# Patient Record
Sex: Female | Born: 1935 | Race: White | Hispanic: No | Marital: Married | Smoking: Never smoker
Health system: Southern US, Community
[De-identification: ages and names within clinical notes are randomized; demographics above are authoritative.]

## PROBLEM LIST (undated history)

## (undated) DIAGNOSIS — J449 Chronic obstructive pulmonary disease, unspecified: Secondary | ICD-10-CM

## (undated) DIAGNOSIS — Z7901 Long term (current) use of anticoagulants: Secondary | ICD-10-CM

## (undated) DIAGNOSIS — N200 Calculus of kidney: Secondary | ICD-10-CM

## (undated) DIAGNOSIS — I10 Essential (primary) hypertension: Secondary | ICD-10-CM

## (undated) DIAGNOSIS — M1990 Unspecified osteoarthritis, unspecified site: Secondary | ICD-10-CM

## (undated) DIAGNOSIS — I482 Chronic atrial fibrillation, unspecified: Secondary | ICD-10-CM

## (undated) DIAGNOSIS — I2510 Atherosclerotic heart disease of native coronary artery without angina pectoris: Secondary | ICD-10-CM

## (undated) DIAGNOSIS — E785 Hyperlipidemia, unspecified: Secondary | ICD-10-CM

## (undated) HISTORY — DX: Unspecified osteoarthritis, unspecified site: M19.90

## (undated) HISTORY — PX: PARATHYROIDECTOMY: SHX19

## (undated) HISTORY — DX: Hyperlipidemia, unspecified: E78.5

## (undated) HISTORY — PX: KNEE ARTHROSCOPY: SUR90

## (undated) HISTORY — DX: Calculus of kidney: N20.0

## (undated) HISTORY — PX: KIDNEY STONE SURGERY: SHX686

## (undated) HISTORY — DX: Chronic atrial fibrillation, unspecified: I48.20

## (undated) HISTORY — PX: TOTAL ABDOMINAL HYSTERECTOMY: SHX209

## (undated) HISTORY — PX: CARDIOVERSION: SHX1299

## (undated) HISTORY — DX: Long term (current) use of anticoagulants: Z79.01

## (undated) HISTORY — PX: COLONOSCOPY: SHX174

---

## 2000-07-23 ENCOUNTER — Encounter: Payer: Self-pay | Admitting: Pulmonary Disease

## 2000-07-23 ENCOUNTER — Inpatient Hospital Stay (HOSPITAL_COMMUNITY): Admission: AD | Admit: 2000-07-23 | Discharge: 2000-07-28 | Payer: Self-pay | Admitting: Pulmonary Disease

## 2000-07-24 ENCOUNTER — Encounter: Payer: Self-pay | Admitting: Pulmonary Disease

## 2000-07-26 ENCOUNTER — Encounter: Payer: Self-pay | Admitting: Pulmonary Disease

## 2000-11-08 ENCOUNTER — Ambulatory Visit (HOSPITAL_COMMUNITY): Admission: RE | Admit: 2000-11-08 | Discharge: 2000-11-08 | Payer: Self-pay | Admitting: Internal Medicine

## 2000-11-08 ENCOUNTER — Encounter: Payer: Self-pay | Admitting: Internal Medicine

## 2001-04-21 ENCOUNTER — Encounter: Payer: Self-pay | Admitting: Internal Medicine

## 2001-04-21 ENCOUNTER — Ambulatory Visit (HOSPITAL_COMMUNITY): Admission: RE | Admit: 2001-04-21 | Discharge: 2001-04-21 | Payer: Self-pay | Admitting: Internal Medicine

## 2001-07-24 ENCOUNTER — Inpatient Hospital Stay (HOSPITAL_COMMUNITY): Admission: EM | Admit: 2001-07-24 | Discharge: 2001-07-30 | Payer: Self-pay | Admitting: Emergency Medicine

## 2001-07-24 ENCOUNTER — Encounter: Payer: Self-pay | Admitting: Emergency Medicine

## 2001-07-25 ENCOUNTER — Encounter: Payer: Self-pay | Admitting: Internal Medicine

## 2001-07-26 ENCOUNTER — Encounter: Payer: Self-pay | Admitting: Internal Medicine

## 2001-08-15 ENCOUNTER — Encounter: Payer: Self-pay | Admitting: Internal Medicine

## 2001-08-15 ENCOUNTER — Ambulatory Visit (HOSPITAL_COMMUNITY): Admission: RE | Admit: 2001-08-15 | Discharge: 2001-08-15 | Payer: Self-pay | Admitting: Internal Medicine

## 2001-11-12 ENCOUNTER — Encounter: Payer: Self-pay | Admitting: Internal Medicine

## 2001-11-12 ENCOUNTER — Inpatient Hospital Stay (HOSPITAL_COMMUNITY): Admission: EM | Admit: 2001-11-12 | Discharge: 2001-11-15 | Payer: Self-pay | Admitting: Internal Medicine

## 2002-02-02 ENCOUNTER — Encounter: Payer: Self-pay | Admitting: Emergency Medicine

## 2002-02-02 ENCOUNTER — Emergency Department (HOSPITAL_COMMUNITY): Admission: EM | Admit: 2002-02-02 | Discharge: 2002-02-02 | Payer: Self-pay | Admitting: Emergency Medicine

## 2002-03-23 ENCOUNTER — Encounter: Payer: Self-pay | Admitting: Orthopedic Surgery

## 2002-03-23 ENCOUNTER — Encounter: Admission: RE | Admit: 2002-03-23 | Discharge: 2002-03-23 | Payer: Self-pay | Admitting: Orthopedic Surgery

## 2002-03-24 ENCOUNTER — Ambulatory Visit (HOSPITAL_BASED_OUTPATIENT_CLINIC_OR_DEPARTMENT_OTHER): Admission: RE | Admit: 2002-03-24 | Discharge: 2002-03-24 | Payer: Self-pay | Admitting: Orthopedic Surgery

## 2002-06-08 ENCOUNTER — Ambulatory Visit (HOSPITAL_COMMUNITY): Admission: RE | Admit: 2002-06-08 | Discharge: 2002-06-08 | Payer: Self-pay | Admitting: Internal Medicine

## 2002-06-08 ENCOUNTER — Encounter: Payer: Self-pay | Admitting: Internal Medicine

## 2002-07-20 HISTORY — PX: ORIF HIP FRACTURE: SHX2125

## 2002-09-16 ENCOUNTER — Inpatient Hospital Stay (HOSPITAL_COMMUNITY): Admission: EM | Admit: 2002-09-16 | Discharge: 2002-09-21 | Payer: Self-pay | Admitting: Emergency Medicine

## 2002-09-16 ENCOUNTER — Encounter: Payer: Self-pay | Admitting: Emergency Medicine

## 2002-09-17 ENCOUNTER — Encounter: Payer: Self-pay | Admitting: Orthopedic Surgery

## 2002-09-19 ENCOUNTER — Encounter: Payer: Self-pay | Admitting: Orthopedic Surgery

## 2002-09-20 ENCOUNTER — Encounter: Payer: Self-pay | Admitting: Orthopedic Surgery

## 2002-09-21 ENCOUNTER — Inpatient Hospital Stay (HOSPITAL_COMMUNITY)
Admission: RE | Admit: 2002-09-21 | Discharge: 2002-09-28 | Payer: Self-pay | Admitting: Physical Medicine & Rehabilitation

## 2002-09-27 ENCOUNTER — Encounter: Payer: Self-pay | Admitting: Physical Medicine & Rehabilitation

## 2002-10-12 ENCOUNTER — Encounter: Payer: Self-pay | Admitting: Internal Medicine

## 2002-10-12 ENCOUNTER — Ambulatory Visit (HOSPITAL_COMMUNITY): Admission: RE | Admit: 2002-10-12 | Discharge: 2002-10-12 | Payer: Self-pay | Admitting: Internal Medicine

## 2002-12-21 ENCOUNTER — Encounter (HOSPITAL_COMMUNITY): Admission: RE | Admit: 2002-12-21 | Discharge: 2003-01-20 | Payer: Self-pay | Admitting: Orthopedic Surgery

## 2003-01-10 ENCOUNTER — Ambulatory Visit (HOSPITAL_COMMUNITY): Admission: RE | Admit: 2003-01-10 | Discharge: 2003-01-10 | Payer: Self-pay | Admitting: Cardiology

## 2003-01-15 ENCOUNTER — Encounter (HOSPITAL_COMMUNITY): Admission: RE | Admit: 2003-01-15 | Discharge: 2003-02-14 | Payer: Self-pay | Admitting: *Deleted

## 2004-01-17 ENCOUNTER — Observation Stay (HOSPITAL_COMMUNITY): Admission: AD | Admit: 2004-01-17 | Discharge: 2004-01-18 | Payer: Self-pay | Admitting: *Deleted

## 2004-02-21 ENCOUNTER — Ambulatory Visit (HOSPITAL_COMMUNITY): Admission: RE | Admit: 2004-02-21 | Discharge: 2004-02-21 | Payer: Self-pay | Admitting: *Deleted

## 2004-06-09 ENCOUNTER — Ambulatory Visit: Payer: Self-pay | Admitting: *Deleted

## 2004-06-30 ENCOUNTER — Ambulatory Visit: Payer: Self-pay | Admitting: Cardiology

## 2004-08-04 ENCOUNTER — Ambulatory Visit: Payer: Self-pay | Admitting: Internal Medicine

## 2004-09-03 ENCOUNTER — Ambulatory Visit: Payer: Self-pay | Admitting: *Deleted

## 2004-10-01 ENCOUNTER — Ambulatory Visit: Payer: Self-pay | Admitting: Cardiovascular Disease

## 2004-10-30 ENCOUNTER — Ambulatory Visit: Payer: Self-pay | Admitting: *Deleted

## 2004-11-06 ENCOUNTER — Ambulatory Visit: Payer: Self-pay | Admitting: *Deleted

## 2004-12-11 ENCOUNTER — Ambulatory Visit: Payer: Self-pay | Admitting: *Deleted

## 2005-01-14 ENCOUNTER — Ambulatory Visit: Payer: Self-pay | Admitting: *Deleted

## 2005-02-16 ENCOUNTER — Ambulatory Visit: Payer: Self-pay | Admitting: *Deleted

## 2005-03-19 ENCOUNTER — Ambulatory Visit: Payer: Self-pay | Admitting: *Deleted

## 2005-03-26 ENCOUNTER — Ambulatory Visit: Payer: Self-pay | Admitting: *Deleted

## 2005-04-16 ENCOUNTER — Ambulatory Visit: Payer: Self-pay | Admitting: *Deleted

## 2005-05-14 ENCOUNTER — Ambulatory Visit: Payer: Self-pay | Admitting: *Deleted

## 2005-06-17 ENCOUNTER — Ambulatory Visit: Payer: Self-pay | Admitting: *Deleted

## 2005-07-16 ENCOUNTER — Ambulatory Visit: Payer: Self-pay | Admitting: Cardiology

## 2005-08-18 ENCOUNTER — Ambulatory Visit: Payer: Self-pay | Admitting: *Deleted

## 2005-09-18 ENCOUNTER — Ambulatory Visit: Payer: Self-pay | Admitting: *Deleted

## 2005-09-30 ENCOUNTER — Inpatient Hospital Stay (HOSPITAL_COMMUNITY): Admission: AD | Admit: 2005-09-30 | Discharge: 2005-10-05 | Payer: Self-pay | Admitting: Internal Medicine

## 2005-09-30 ENCOUNTER — Emergency Department (HOSPITAL_COMMUNITY): Admission: EM | Admit: 2005-09-30 | Discharge: 2005-09-30 | Payer: Self-pay | Admitting: Emergency Medicine

## 2005-10-27 ENCOUNTER — Ambulatory Visit: Payer: Self-pay | Admitting: *Deleted

## 2005-11-27 ENCOUNTER — Ambulatory Visit: Payer: Self-pay | Admitting: *Deleted

## 2005-12-31 ENCOUNTER — Ambulatory Visit: Payer: Self-pay | Admitting: *Deleted

## 2006-01-26 ENCOUNTER — Ambulatory Visit: Payer: Self-pay | Admitting: Cardiology

## 2006-03-03 ENCOUNTER — Ambulatory Visit: Payer: Self-pay | Admitting: *Deleted

## 2006-04-07 ENCOUNTER — Ambulatory Visit: Payer: Self-pay | Admitting: Cardiology

## 2006-04-13 ENCOUNTER — Ambulatory Visit: Payer: Self-pay | Admitting: Cardiology

## 2006-05-06 ENCOUNTER — Ambulatory Visit (HOSPITAL_COMMUNITY): Admission: RE | Admit: 2006-05-06 | Discharge: 2006-05-06 | Payer: Self-pay | Admitting: Internal Medicine

## 2006-05-07 ENCOUNTER — Ambulatory Visit: Payer: Self-pay | Admitting: Cardiology

## 2006-05-27 ENCOUNTER — Ambulatory Visit (HOSPITAL_COMMUNITY): Admission: RE | Admit: 2006-05-27 | Discharge: 2006-05-27 | Payer: Self-pay | Admitting: Internal Medicine

## 2006-06-07 ENCOUNTER — Ambulatory Visit: Payer: Self-pay | Admitting: Cardiology

## 2006-07-06 ENCOUNTER — Ambulatory Visit: Payer: Self-pay | Admitting: Cardiology

## 2006-08-10 ENCOUNTER — Ambulatory Visit: Payer: Self-pay | Admitting: Cardiovascular Disease

## 2006-09-16 ENCOUNTER — Ambulatory Visit: Payer: Self-pay | Admitting: Cardiology

## 2006-10-14 ENCOUNTER — Ambulatory Visit: Payer: Self-pay | Admitting: Cardiology

## 2006-11-12 ENCOUNTER — Ambulatory Visit: Payer: Self-pay | Admitting: Internal Medicine

## 2006-12-14 ENCOUNTER — Ambulatory Visit: Payer: Self-pay | Admitting: Cardiology

## 2007-01-17 ENCOUNTER — Ambulatory Visit: Payer: Self-pay | Admitting: Internal Medicine

## 2007-02-14 ENCOUNTER — Ambulatory Visit: Payer: Self-pay | Admitting: Cardiology

## 2007-03-02 ENCOUNTER — Ambulatory Visit: Payer: Self-pay | Admitting: Cardiology

## 2007-03-30 ENCOUNTER — Ambulatory Visit: Payer: Self-pay | Admitting: Cardiology

## 2007-04-27 ENCOUNTER — Ambulatory Visit: Payer: Self-pay | Admitting: Cardiovascular Disease

## 2007-06-01 ENCOUNTER — Ambulatory Visit: Payer: Self-pay | Admitting: Cardiovascular Disease

## 2007-06-29 ENCOUNTER — Ambulatory Visit: Payer: Self-pay | Admitting: Cardiology

## 2007-07-29 ENCOUNTER — Ambulatory Visit: Payer: Self-pay | Admitting: Cardiology

## 2007-08-12 ENCOUNTER — Ambulatory Visit: Payer: Self-pay | Admitting: Cardiology

## 2007-08-16 ENCOUNTER — Ambulatory Visit: Payer: Self-pay | Admitting: Cardiology

## 2007-09-08 ENCOUNTER — Ambulatory Visit: Payer: Self-pay | Admitting: Cardiovascular Disease

## 2007-10-06 ENCOUNTER — Ambulatory Visit: Payer: Self-pay | Admitting: Cardiology

## 2007-11-03 ENCOUNTER — Ambulatory Visit: Payer: Self-pay | Admitting: Cardiovascular Disease

## 2007-12-02 ENCOUNTER — Ambulatory Visit: Payer: Self-pay | Admitting: Cardiology

## 2008-01-02 ENCOUNTER — Ambulatory Visit: Payer: Self-pay | Admitting: Cardiology

## 2008-01-16 ENCOUNTER — Ambulatory Visit: Payer: Self-pay | Admitting: Internal Medicine

## 2008-02-11 ENCOUNTER — Emergency Department (HOSPITAL_COMMUNITY): Admission: EM | Admit: 2008-02-11 | Discharge: 2008-02-11 | Payer: Self-pay | Admitting: Emergency Medicine

## 2008-02-13 ENCOUNTER — Ambulatory Visit: Payer: Self-pay | Admitting: Cardiology

## 2008-02-14 ENCOUNTER — Inpatient Hospital Stay (HOSPITAL_COMMUNITY): Admission: EM | Admit: 2008-02-14 | Discharge: 2008-02-18 | Payer: Self-pay | Admitting: Emergency Medicine

## 2008-02-27 ENCOUNTER — Ambulatory Visit: Payer: Self-pay | Admitting: Cardiology

## 2008-03-06 ENCOUNTER — Ambulatory Visit: Payer: Self-pay | Admitting: Cardiology

## 2008-03-22 ENCOUNTER — Ambulatory Visit: Payer: Self-pay | Admitting: Cardiology

## 2008-04-05 ENCOUNTER — Ambulatory Visit: Payer: Self-pay | Admitting: Cardiology

## 2008-04-23 ENCOUNTER — Ambulatory Visit: Payer: Self-pay | Admitting: Cardiology

## 2008-05-14 ENCOUNTER — Ambulatory Visit: Payer: Self-pay | Admitting: Cardiology

## 2008-06-18 ENCOUNTER — Ambulatory Visit: Payer: Self-pay | Admitting: Cardiology

## 2008-07-09 ENCOUNTER — Ambulatory Visit: Payer: Self-pay | Admitting: Cardiology

## 2008-07-16 ENCOUNTER — Ambulatory Visit (HOSPITAL_COMMUNITY): Admission: RE | Admit: 2008-07-16 | Discharge: 2008-07-16 | Payer: Self-pay | Admitting: Internal Medicine

## 2008-07-16 ENCOUNTER — Ambulatory Visit: Payer: Self-pay | Admitting: Cardiology

## 2008-07-30 ENCOUNTER — Ambulatory Visit: Payer: Self-pay | Admitting: Cardiology

## 2008-08-07 ENCOUNTER — Ambulatory Visit (HOSPITAL_COMMUNITY): Admission: RE | Admit: 2008-08-07 | Discharge: 2008-08-07 | Payer: Self-pay | Admitting: Internal Medicine

## 2008-08-13 ENCOUNTER — Ambulatory Visit: Payer: Self-pay | Admitting: Cardiology

## 2008-08-23 ENCOUNTER — Ambulatory Visit: Payer: Self-pay | Admitting: Cardiology

## 2008-09-03 ENCOUNTER — Emergency Department (HOSPITAL_COMMUNITY): Admission: EM | Admit: 2008-09-03 | Discharge: 2008-09-03 | Payer: Self-pay | Admitting: Emergency Medicine

## 2008-09-10 ENCOUNTER — Ambulatory Visit: Payer: Self-pay | Admitting: Cardiology

## 2008-09-17 ENCOUNTER — Ambulatory Visit: Payer: Self-pay | Admitting: Cardiology

## 2008-10-11 ENCOUNTER — Ambulatory Visit: Payer: Self-pay

## 2008-11-15 ENCOUNTER — Ambulatory Visit: Payer: Self-pay | Admitting: Cardiology

## 2008-12-13 ENCOUNTER — Ambulatory Visit: Payer: Self-pay | Admitting: Cardiology

## 2009-01-07 ENCOUNTER — Ambulatory Visit: Payer: Self-pay | Admitting: Cardiology

## 2009-02-04 ENCOUNTER — Ambulatory Visit: Payer: Self-pay | Admitting: Cardiology

## 2009-02-04 ENCOUNTER — Encounter (INDEPENDENT_AMBULATORY_CARE_PROVIDER_SITE_OTHER): Payer: Self-pay

## 2009-02-04 LAB — CONVERTED CEMR LAB
ALT: 17 units/L
ALT: 17 units/L (ref 0–35)
AST: 24 units/L
Alkaline Phosphatase: 84 units/L
Alkaline Phosphatase: 84 units/L (ref 39–117)
Bilirubin, Direct: 0.2 mg/dL
Cholesterol: 149 mg/dL
Cholesterol: 149 mg/dL (ref 0–200)
HDL: 29 mg/dL — ABNORMAL LOW (ref >39)
LDL Cholesterol: 84 mg/dL
Total Bilirubin: 0.9 mg/dL (ref 0.3–1.2)
Total CHOL/HDL Ratio: 5.1
Triglycerides: 179 mg/dL
VLDL: 36 mg/dL (ref 0–40)

## 2009-02-11 ENCOUNTER — Encounter (INDEPENDENT_AMBULATORY_CARE_PROVIDER_SITE_OTHER): Payer: Self-pay | Admitting: *Deleted

## 2009-03-04 ENCOUNTER — Encounter: Payer: Self-pay | Admitting: *Deleted

## 2009-03-08 ENCOUNTER — Encounter: Payer: Self-pay | Admitting: Cardiology

## 2009-03-08 LAB — CONVERTED CEMR LAB: OCCULT 2: NEGATIVE

## 2009-03-11 ENCOUNTER — Ambulatory Visit: Payer: Self-pay | Admitting: Cardiology

## 2009-04-04 ENCOUNTER — Ambulatory Visit: Payer: Self-pay

## 2009-05-02 ENCOUNTER — Ambulatory Visit: Payer: Self-pay | Admitting: Cardiology

## 2009-05-30 ENCOUNTER — Ambulatory Visit: Payer: Self-pay | Admitting: Cardiology

## 2009-05-30 LAB — CONVERTED CEMR LAB: POC INR: 2.2

## 2009-06-27 ENCOUNTER — Ambulatory Visit: Payer: Self-pay | Admitting: Cardiology

## 2009-07-25 ENCOUNTER — Ambulatory Visit: Payer: Self-pay | Admitting: Cardiology

## 2009-07-25 LAB — CONVERTED CEMR LAB: POC INR: 1.9

## 2009-08-22 ENCOUNTER — Ambulatory Visit: Payer: Self-pay | Admitting: Cardiology

## 2009-08-22 LAB — CONVERTED CEMR LAB: POC INR: 1.4

## 2009-09-09 ENCOUNTER — Ambulatory Visit: Payer: Self-pay | Admitting: Cardiology

## 2009-09-30 ENCOUNTER — Ambulatory Visit: Payer: Self-pay | Admitting: Cardiology

## 2009-10-07 ENCOUNTER — Inpatient Hospital Stay (HOSPITAL_COMMUNITY): Admission: EM | Admit: 2009-10-07 | Discharge: 2009-10-10 | Payer: Self-pay | Admitting: Emergency Medicine

## 2009-10-15 ENCOUNTER — Ambulatory Visit: Payer: Self-pay | Admitting: Cardiology

## 2009-10-17 ENCOUNTER — Emergency Department (HOSPITAL_COMMUNITY)
Admission: EM | Admit: 2009-10-17 | Discharge: 2009-10-17 | Payer: Self-pay | Source: Home / Self Care | Admitting: Emergency Medicine

## 2009-10-21 ENCOUNTER — Ambulatory Visit: Payer: Self-pay | Admitting: Cardiology

## 2009-10-21 LAB — CONVERTED CEMR LAB: POC INR: 2.1

## 2009-11-07 ENCOUNTER — Ambulatory Visit: Payer: Self-pay | Admitting: Cardiology

## 2009-11-07 LAB — CONVERTED CEMR LAB: POC INR: 1.5

## 2009-11-14 ENCOUNTER — Ambulatory Visit: Payer: Self-pay | Admitting: Cardiology

## 2009-11-14 LAB — CONVERTED CEMR LAB: POC INR: 1.8

## 2009-11-27 ENCOUNTER — Encounter (INDEPENDENT_AMBULATORY_CARE_PROVIDER_SITE_OTHER): Payer: Self-pay | Admitting: *Deleted

## 2009-12-02 ENCOUNTER — Ambulatory Visit: Payer: Self-pay | Admitting: Cardiology

## 2009-12-02 LAB — CONVERTED CEMR LAB: POC INR: 1.8

## 2009-12-31 ENCOUNTER — Encounter (INDEPENDENT_AMBULATORY_CARE_PROVIDER_SITE_OTHER): Payer: Self-pay

## 2010-01-03 ENCOUNTER — Ambulatory Visit: Payer: Self-pay | Admitting: Cardiology

## 2010-01-03 ENCOUNTER — Encounter: Payer: Self-pay | Admitting: Cardiology

## 2010-01-03 DIAGNOSIS — N200 Calculus of kidney: Secondary | ICD-10-CM

## 2010-01-03 DIAGNOSIS — M6282 Rhabdomyolysis: Secondary | ICD-10-CM | POA: Insufficient documentation

## 2010-01-03 DIAGNOSIS — M109 Gout, unspecified: Secondary | ICD-10-CM | POA: Insufficient documentation

## 2010-01-03 LAB — CONVERTED CEMR LAB: POC INR: 2.5

## 2010-01-06 ENCOUNTER — Ambulatory Visit: Payer: Self-pay | Admitting: Cardiology

## 2010-01-08 ENCOUNTER — Encounter (INDEPENDENT_AMBULATORY_CARE_PROVIDER_SITE_OTHER): Payer: Self-pay | Admitting: *Deleted

## 2010-01-08 LAB — CONVERTED CEMR LAB: OCCULT 3: NEGATIVE

## 2010-01-12 DIAGNOSIS — D696 Thrombocytopenia, unspecified: Secondary | ICD-10-CM

## 2010-01-15 ENCOUNTER — Encounter (INDEPENDENT_AMBULATORY_CARE_PROVIDER_SITE_OTHER): Payer: Self-pay | Admitting: *Deleted

## 2010-01-15 LAB — CONVERTED CEMR LAB
ALT: 17 units/L (ref 0–35)
Albumin: 4.7 g/dL (ref 3.5–5.2)
BUN: 19 mg/dL (ref 6–23)
Calcium: 9.3 mg/dL (ref 8.4–10.5)
Chloride: 101 meq/L (ref 96–112)
Creatinine, Ser: 0.93 mg/dL (ref 0.40–1.20)
Digitoxin Lvl: 1.8 ng/mL (ref 0.8–2.0)
Eosinophils Relative: 3 % (ref 0–5)
Glucose, Bld: 92 mg/dL (ref 70–99)
Hemoglobin: 14.8 g/dL (ref 12.0–15.0)
Platelets: 112 10*3/uL — ABNORMAL LOW (ref 150–400)
RDW: 14.3 % (ref 11.5–15.5)
Total Bilirubin: 1.2 mg/dL (ref 0.3–1.2)
Total Protein: 7.6 g/dL (ref 6.0–8.3)

## 2010-01-16 ENCOUNTER — Encounter (INDEPENDENT_AMBULATORY_CARE_PROVIDER_SITE_OTHER): Payer: Self-pay | Admitting: *Deleted

## 2010-01-17 ENCOUNTER — Encounter (INDEPENDENT_AMBULATORY_CARE_PROVIDER_SITE_OTHER): Payer: Self-pay | Admitting: *Deleted

## 2010-02-05 ENCOUNTER — Ambulatory Visit: Payer: Self-pay | Admitting: Cardiology

## 2010-02-05 LAB — CONVERTED CEMR LAB: POC INR: 2.1

## 2010-02-17 ENCOUNTER — Encounter: Payer: Self-pay | Admitting: Cardiology

## 2010-02-17 ENCOUNTER — Encounter (INDEPENDENT_AMBULATORY_CARE_PROVIDER_SITE_OTHER): Payer: Self-pay | Admitting: *Deleted

## 2010-02-17 LAB — CONVERTED CEMR LAB
Basophils Absolute: 0 10*3/uL (ref 0.0–0.1)
Basophils Relative: 0 %
Eosinophils Absolute: 0.4 10*3/uL
HCT: 45.1 %
Hemoglobin: 15.1 g/dL
Hemoglobin: 15.1 g/dL — ABNORMAL HIGH (ref 12.0–15.0)
Lymphocytes Relative: 25 % (ref 12–46)
Lymphs Abs: 2.8 10*3/uL
MCV: 97.2 fL
Monocytes Absolute: 2 10*3/uL
Monocytes Absolute: 2 10*3/uL — ABNORMAL HIGH (ref 0.1–1.0)
Monocytes Relative: 17 %
Neutro Abs: 6.4 10*3/uL (ref 1.7–7.7)
Neutrophils Relative %: 55 % (ref 43–77)
Platelets: 115 10*3/uL — ABNORMAL LOW (ref 150–400)
RDW: 13.7 % (ref 11.5–15.5)
WBC: 11.5 10*3/uL

## 2010-02-18 ENCOUNTER — Encounter (INDEPENDENT_AMBULATORY_CARE_PROVIDER_SITE_OTHER): Payer: Self-pay | Admitting: *Deleted

## 2010-03-13 ENCOUNTER — Ambulatory Visit: Payer: Self-pay | Admitting: Cardiology

## 2010-03-31 LAB — CONVERTED CEMR LAB
BUN: 23 mg/dL (ref 6–23)
CO2: 30 meq/L (ref 19–32)
Calcium: 9 mg/dL (ref 8.4–10.5)
Glucose, Bld: 105 mg/dL — ABNORMAL HIGH (ref 70–99)

## 2010-04-07 ENCOUNTER — Encounter (INDEPENDENT_AMBULATORY_CARE_PROVIDER_SITE_OTHER): Payer: Self-pay | Admitting: *Deleted

## 2010-04-10 ENCOUNTER — Ambulatory Visit: Payer: Self-pay | Admitting: Cardiology

## 2010-04-10 LAB — CONVERTED CEMR LAB: POC INR: 2.2

## 2010-05-08 ENCOUNTER — Ambulatory Visit: Payer: Self-pay | Admitting: Cardiology

## 2010-06-05 ENCOUNTER — Ambulatory Visit: Payer: Self-pay

## 2010-07-03 ENCOUNTER — Ambulatory Visit: Payer: Self-pay | Admitting: Cardiology

## 2010-07-03 LAB — CONVERTED CEMR LAB: POC INR: 2.8

## 2010-07-23 ENCOUNTER — Ambulatory Visit: Admission: RE | Admit: 2010-07-23 | Discharge: 2010-07-23 | Payer: Self-pay | Source: Home / Self Care

## 2010-07-28 ENCOUNTER — Emergency Department (HOSPITAL_COMMUNITY)
Admission: EM | Admit: 2010-07-28 | Discharge: 2010-07-28 | Payer: Self-pay | Source: Home / Self Care | Admitting: Emergency Medicine

## 2010-08-04 LAB — URINALYSIS, ROUTINE W REFLEX MICROSCOPIC
Bilirubin Urine: NEGATIVE
Nitrite: POSITIVE — AB
Protein, ur: NEGATIVE mg/dL
Specific Gravity, Urine: 1.015 (ref 1.005–1.030)
Urine Glucose, Fasting: NEGATIVE mg/dL
Urobilinogen, UA: 1 mg/dL (ref 0.0–1.0)
pH: 7 (ref 5.0–8.0)

## 2010-08-04 LAB — DIFFERENTIAL
Basophils Absolute: 0 10*3/uL (ref 0.0–0.1)
Basophils Relative: 0 % (ref 0–1)
Eosinophils Absolute: 0.4 10*3/uL (ref 0.0–0.7)
Eosinophils Relative: 2 % (ref 0–5)
Lymphocytes Relative: 13 % (ref 12–46)
Lymphs Abs: 2.5 10*3/uL (ref 0.7–4.0)
Monocytes Absolute: 2.5 10*3/uL — ABNORMAL HIGH (ref 0.1–1.0)
Monocytes Relative: 13 % — ABNORMAL HIGH (ref 3–12)
Neutro Abs: 13.7 10*3/uL — ABNORMAL HIGH (ref 1.7–7.7)
Neutrophils Relative %: 72 % (ref 43–77)

## 2010-08-04 LAB — CBC
HCT: 47.3 % — ABNORMAL HIGH (ref 36.0–46.0)
Hemoglobin: 16.3 g/dL — ABNORMAL HIGH (ref 12.0–15.0)
MCH: 32.5 pg (ref 26.0–34.0)
MCHC: 34.5 g/dL (ref 30.0–36.0)
MCV: 94.4 fL (ref 78.0–100.0)
Platelets: 110 10*3/uL — ABNORMAL LOW (ref 150–400)
RBC: 5.01 MIL/uL (ref 3.87–5.11)
RDW: 13.3 % (ref 11.5–15.5)
WBC: 19.1 10*3/uL — ABNORMAL HIGH (ref 4.0–10.5)

## 2010-08-04 LAB — URINE CULTURE
Colony Count: >=100000
Culture  Setup Time: 201201091315

## 2010-08-04 LAB — COMPREHENSIVE METABOLIC PANEL
ALT: 15 U/L (ref 0–35)
AST: 18 U/L (ref 0–37)
Albumin: 3.9 g/dL (ref 3.5–5.2)
Alkaline Phosphatase: 66 U/L (ref 39–117)
BUN: 25 mg/dL — ABNORMAL HIGH (ref 6–23)
CO2: 28 mEq/L (ref 19–32)
Calcium: 8.9 mg/dL (ref 8.4–10.5)
Chloride: 104 mEq/L (ref 96–112)
Creatinine, Ser: 0.88 mg/dL (ref 0.4–1.2)
GFR calc Af Amer: >60 mL/min (ref >60)
GFR calc non Af Amer: >60 mL/min (ref >60)
Glucose, Bld: 108 mg/dL — ABNORMAL HIGH (ref 70–99)
Potassium: 3.7 mEq/L (ref 3.5–5.1)
Sodium: 139 mEq/L (ref 135–145)
Total Bilirubin: 1.3 mg/dL — ABNORMAL HIGH (ref 0.3–1.2)
Total Protein: 7 g/dL (ref 6.0–8.3)

## 2010-08-04 LAB — URINE MICROSCOPIC-ADD ON

## 2010-08-04 LAB — POCT CARDIAC MARKERS
CKMB, poc: <1 ng/mL — ABNORMAL LOW (ref 1.0–8.0)
Myoglobin, poc: 58.8 ng/mL (ref 12–200)
Troponin i, poc: <0.05 ng/mL (ref 0.00–0.09)

## 2010-08-04 LAB — PROTIME-INR
INR: 1.39 (ref 0.00–1.49)
Prothrombin Time: 17.3 seconds — ABNORMAL HIGH (ref 11.6–15.2)

## 2010-08-04 LAB — DIGOXIN LEVEL: Digoxin Level: 1.4 ng/mL (ref 0.8–2.0)

## 2010-08-07 ENCOUNTER — Ambulatory Visit (HOSPITAL_COMMUNITY)
Admission: RE | Admit: 2010-08-07 | Discharge: 2010-08-07 | Payer: Self-pay | Source: Home / Self Care | Attending: Internal Medicine | Admitting: Internal Medicine

## 2010-08-14 ENCOUNTER — Ambulatory Visit: Admission: RE | Admit: 2010-08-14 | Discharge: 2010-08-14 | Payer: Self-pay | Source: Home / Self Care

## 2010-08-14 LAB — CONVERTED CEMR LAB: POC INR: 1.5

## 2010-08-19 NOTE — Medication Information (Signed)
Summary: ccr-lr  Anticoagulant Therapy  Managed by: Jenna Hey, RN Supervising [name]: [name], [name] Indication 1: Atrial Fibrillation (ICD-427.31) Lab Used: [facility] [place] Site: [place] INR POC 1.5  Dietary changes: no    Health status changes: yes       Details: Has UTI  Bleeding/hemorrhagic complications: no    Recent/future hospitalizations: no    Any changes in medication regimen? yes       Details: Started on Cipro 250mg  bid 11/05/09 x 10 days  Finished 11/14/09  Recent/future dental: no  Any missed doses?: no       Is patient compliant with meds? yes       Allergies: No Known Drug Allergies  Anticoagulation Management History:      The patient is taking warfarin and comes in today for a routine follow up visit.  Positive risk factors for bleeding include an age of 75 years or older.  The bleeding index is 'intermediate risk'.  Positive CHADS2 values include Age > 4 years old.  The start date was 03/08/1997.  Anticoagulation responsible provider: Diona Browner [name], [name].  INR POC: 1.5.  Cuvette Lot#: [id number].  Exp: 05/12.    Anticoagulation Management Assessment/Plan:      The patient's current anticoagulation dose is Warfarin sodium 5 mg tabs: Take 1 tablet by mouth once a day.  The target INR is 2 - 3.  The next INR is due 11/14/2009.  Anticoagulation instructions were given to patient.  Results were reviewed/authorized by Jenna Hey, RN.  She was notified by Jenna Hey RN.         Prior Anticoagulation Instructions: INR 2.1 Continue coumadin 7.5mg  once daily   Current Anticoagulation Instructions: INR 1.5 Take coumadin 2 1/2 tablets tonight and tomorrow night then resume 7.5mg  once daily

## 2010-08-19 NOTE — Medication Information (Signed)
Summary: ccr-lr  Anticoagulant Therapy  Managed by: Vashti Hey, RN PCP: Dr. Carylon Perches Supervising MD: [name] MD, Maisie Fus Indication 1: Atrial Fibrillation (ICD-427.31) Lab Used: [facility] [place] Site: [place] INR POC 2.1   Health status changes: yes       Details: Has TMJ  Bleeding/hemorrhagic complications: no    Recent/future hospitalizations: no    Any changes in medication regimen? yes       Details: On hydrocodone 5/500 fro pain  Recent/future dental: no  Any missed doses?: no       Is patient compliant with meds? yes       Allergies: 1)  ! * Baycol  Anticoagulation Management History:      The patient is taking warfarin and comes in today for a routine follow up visit.  Positive risk factors for bleeding include an age of 9 years or older.  The bleeding index is 'intermediate risk'.  Positive CHADS2 values include History of HTN and Age > 45 years old.  The start date was 03/08/1997.  Anticoagulation responsible provider: [name] MD, Maisie Fus.  INR POC: 2.1.  Cuvette Lot#: [id number].  Exp: 05/12.    Anticoagulation Management Assessment/Plan:      The patient's current anticoagulation dose is Warfarin sodium 5 mg tabs: Take 1 tablet by mouth once a day.  The target INR is 2 - 3.  The next INR is due 04/10/2010.  Anticoagulation instructions were given to patient.  Results were reviewed/authorized by Vashti Hey, RN.  She was notified by Vashti Hey RN.         Prior Anticoagulation Instructions: INR 2.1 Continue coumadin 7.5mg  once daily except 10mg  on Mondays, Wednesdays and Fridays  Current Anticoagulation Instructions: Same as Prior Instructions.

## 2010-08-19 NOTE — Medication Information (Signed)
Summary: coumadin check per checkout on 01/06/10/sn  Anticoagulant Therapy  Managed by: Vashti Hey, RN PCP: Dr. Carylon Perches Supervising MD: [name] MD, Maisie Fus Indication 1: Atrial Fibrillation (ICD-427.31) Lab Used: [facility] [place] Site: [place] INR POC 2.1  Dietary changes: no    Health status changes: no    Bleeding/hemorrhagic complications: no    Recent/future hospitalizations: no    Any changes in medication regimen? no    Recent/future dental: no  Any missed doses?: yes     Details: missed 1 dose 2 weeks ago  Is patient compliant with meds? yes       Allergies: 1)  ! * Baycol  Anticoagulation Management History:      The patient is taking warfarin and comes in today for a routine follow up visit.  Positive risk factors for bleeding include an age of 75 years or older.  The bleeding index is 'intermediate risk'.  Positive CHADS2 values include History of HTN and Age > 75 years old.  The start date was 03/08/1997.  Anticoagulation responsible provider: [name] MD, Maisie Fus.  INR POC: 2.1.  Cuvette Lot#: [id number].  Exp: 05/12.    Anticoagulation Management Assessment/Plan:      The patient's current anticoagulation dose is Warfarin sodium 5 mg tabs: Take 1 tablet by mouth once a day.  The target INR is 2 - 3.  The next INR is due 03/05/2010.  Anticoagulation instructions were given to patient.  Results were reviewed/authorized by Vashti Hey, RN.  She was notified by Vashti Hey RN.         Prior Anticoagulation Instructions: INR 2.5 TODAY PLEASE CONTINUE CURRENT WARFARIN DOSETABLETS ON MONDAYS, WEDNESDAYS, AND FRIDAYS 1.5 TABLET ALL OTHER DAYS  Current Anticoagulation Instructions: INR 2.1 Continue coumadin 7.5mg  once daily except 10mg  on Mondays, Wednesdays and Fridays

## 2010-08-19 NOTE — Miscellaneous (Signed)
Summary: hemoccult cards 01/08/2010  Clinical Lists Changes  Observations: Added new observation of HEMOCCULT 3: neg (01/08/2010 8:17) Added new observation of HEMOCCULT 2: neg (01/08/2010 8:17) Added new observation of HEMOCCULT 1: neg (01/08/2010 8:17)

## 2010-08-19 NOTE — Medication Information (Signed)
Summary: ccr-lr  Anticoagulant Therapy  Managed by: Vashti Hey, RN Supervising MD: Dietrich Pates MD, Molly Maduro Indication 1: Atrial Fibrillation (ICD-427.31) Lab Used: [facility] [place] Site: [place] INR POC 1.4  Dietary changes: no    Health status changes: yes       Details: had bronchitis  Bleeding/hemorrhagic complications: no    Recent/future hospitalizations: no    Any changes in medication regimen? yes       Details: was on antibiotic and had shot   Recent/future dental: no  Any missed doses?: yes     Details: missed 2 doses coumadin  Is patient compliant with meds? yes       Anticoagulation Management History:      The patient is taking warfarin and comes in today for a routine follow up visit.  Positive risk factors for bleeding include an age of 48 years or older.  The bleeding index is 'intermediate risk'.  Positive CHADS2 values include Age > 73 years old.  The start date was 03/08/1997.  Anticoagulation responsible provider: Dietrich Pates MD, Molly Maduro.  INR POC: 1.4.  Cuvette Lot#: [id number].  Exp: 10/11.    Anticoagulation Management Assessment/Plan:      The target INR is 2 - 3.  The next INR is due 09/09/2009.  Anticoagulation instructions were given to patient.  Results were reviewed/authorized by Vashti Hey, RN.  She was notified by Vashti Hey RN.         Prior Anticoagulation Instructions: INR 1.9 Take coumadin 2 tablets today then increase dose to 1 1/2 tablets once daily except 1 tablet on Mondays  Current Anticoagulation Instructions: INR 1.4 Take coumadin 2 1/2 tablets tonight and tomorrow night then resume 1 1/2 tablets once daily except 1 tablet on Mondays

## 2010-08-19 NOTE — Miscellaneous (Signed)
Summary: LABS LIPIDS,LIVER 02/04/2009  Clinical Lists Changes  Observations: Added new observation of ALBUMIN: 4.3 g/dL (16/04/9603 54:09) Added new observation of PROTEIN, TOT: 6.8 g/dL (81/19/1478 29:56) Added new observation of SGPT (ALT): 17 units/L (02/04/2009 15:46) Added new observation of SGOT (AST): 24 units/L (02/04/2009 15:46) Added new observation of ALK PHOS: 84 units/L (02/04/2009 15:46) Added new observation of BILI DIRECT: 0.2 mg/dL (21/30/8657 84:69) Added new observation of LDL: 84 mg/dL (62/95/2841 32:44) Added new observation of HDL: 29 mg/dL (07/22/7251 66:44) Added new observation of TRIGLYC TOT: 179 mg/dL (03/47/4259 56:38) Added new observation of CHOLESTEROL: 149 mg/dL (75/64/3329 51:88)

## 2010-08-19 NOTE — Medication Information (Signed)
Summary: 4/4 protime per checkout on 3/29/tg  Anticoagulant Therapy  Managed by: Vashti Hey, RN Supervising MD: Dietrich Pates MD, Molly Maduro Indication 1: Atrial Fibrillation (ICD-427.31) Lab Used: [facility] [place] Site: [place] INR POC 2.1  Dietary changes: no    Health status changes: no    Bleeding/hemorrhagic complications: no    Recent/future hospitalizations: no    Any changes in medication regimen? no    Recent/future dental: no  Any missed doses?: no       Is patient compliant with meds? yes       Allergies: No Known Drug Allergies  Anticoagulation Management History:      The patient is taking warfarin and comes in today for a routine follow up visit.  Positive risk factors for bleeding include an age of 75 years or older.  The bleeding index is 'intermediate risk'.  Positive CHADS2 values include Age > 46 years old.  The start date was 03/08/1997.  Anticoagulation responsible provider: Dietrich Pates MD, Molly Maduro.  INR POC: 2.1.  Cuvette Lot#: [id number].  Exp: 05/12.    Anticoagulation Management Assessment/Plan:      The patient's current anticoagulation dose is Warfarin sodium 5 mg tabs: Take 1 tablet by mouth once a day.  The target INR is 2 - 3.  The next INR is due 11/07/2009.  Anticoagulation instructions were given to patient.  Results were reviewed/authorized by Vashti Hey, RN.  She was notified by Vashti Hey RN.         Prior Anticoagulation Instructions: INR 5.0 TODAY HOLD TODAY AND WEDNESDAY THEN RESUME 1.5 TABLETS DAILY UNTIL RECHEC ON 10/21/2009  Current Anticoagulation Instructions: INR 2.1 Continue coumadin 7.5mg  once daily

## 2010-08-19 NOTE — Letter (Signed)
Summary: [place] Results Engineer, agricultural at [facility]  [address] [place] [address]   Phone: [phone]  Fax: [phone]      April 07, 2010 MRN: [id number]   [name] [address] [place], [place]  [address]   Dear [name],  Your test ordered by Selena Batten has been reviewed by your physician (or physician assistant) and was found to be normal or stable. Your physician (or physician assistant) felt no changes were needed at this time.  ____ Echocardiogram  ____ Cardiac Stress Test  _X___ Lab Work  ____ Peripheral vascular study of arms, legs or neck  ____ CT scan or X-ray  ____ Lung or Breathing test  __X__ Other: STOOL CARDS  No change in medical treatment at this time, per Dr. Dietrich Pates.  Enclosed is a copy of your labwork for your records.  Thank you, [name] Allyne Gee RN    [place] Bing, MD, Lenise Arena.C.Gaylord Shih, MD, F.A.C.C Lewayne Bunting, MD, F.A.C.C Nona Dell, MD, F.A.C.C Charlton Haws, MD, Lenise Arena.C.C

## 2010-08-19 NOTE — Miscellaneous (Signed)
Summary: labs cbcd,02/17/2010  Clinical Lists Changes  Observations: Added new observation of ABSOLUTE BAS: 0.0 K/uL (02/17/2010 8:14) Added new observation of BASOPHIL %: 0 % (02/17/2010 8:14) Added new observation of EOS ABSLT: 0.4 K/uL (02/17/2010 8:14) Added new observation of % EOS AUTO: 3 % (02/17/2010 8:14) Added new observation of ABSOLUTE MON: 2.0 K/uL (02/17/2010 8:14) Added new observation of MONOCYTE %: 17 % (02/17/2010 8:14) Added new observation of ABS LYMPHOCY: 2.8 K/uL (02/17/2010 8:14) Added new observation of LYMPHS %: 25 % (02/17/2010 8:14) Added new observation of RDW: 13.7 % (02/17/2010 8:14) Added new observation of MCHC RBC: 33.5 g/dL (72/53/6644 0:34) Added new observation of RBC M/UL: 4.64 M/uL (02/17/2010 8:14) Added new observation of PLATELETK/UL: 115 K/uL (02/17/2010 8:14) Added new observation of MCV: 97.2 fL (02/17/2010 8:14) Added new observation of HCT: 45.1 % (02/17/2010 8:14) Added new observation of HGB: 15.1 g/dL (74/25/9563 8:75) Added new observation of WBC COUNT: 11.5 10*3/microliter (02/17/2010 8:14)

## 2010-08-19 NOTE — Letter (Signed)
Summary: Handout Printed  Printed Handout:  - Diet - Seasoning without Salt 

## 2010-08-19 NOTE — Medication Information (Signed)
Summary: protime/tg      Allergies Added: NKDA Anticoagulant Therapy  Managed by: Teressa Lower, RN Supervising MD: Dietrich Pates MD, Molly Maduro Indication 1: Atrial Fibrillation (ICD-427.31) Lab Used: [facility]  Site: [place] INR POC 5.0  Dietary changes: yes       Details: poor appetite  Health status changes: yes       Details: recent hospitalization for mono d/c 10/10/09  Bleeding/hemorrhagic complications: no    Recent/future hospitalizations: yes       Details: d/c 10/10/2009   Any changes in medication regimen? yes    Recent/future dental: no  Any missed doses?: no       Is patient compliant with meds? yes      Comments: restarted 7.5mg  on 10/11/2009, completed Augmentin  10/14/2009  Current Medications (verified): 1)  None  Allergies (verified): No Known Drug Allergies  Anticoagulation Management History:      The patient is taking warfarin and comes in today for a routine follow up visit.  Positive risk factors for bleeding include an age of 21 years or older.  The bleeding index is 'intermediate risk'.  Positive CHADS2 values include Age > 4 years old.  The start date was 03/08/1997.  Anticoagulation responsible provider: Dietrich Pates MD, Molly Maduro.  INR POC: 5.0.  Cuvette Lot#: [id number].  Exp: 05/12.    Anticoagulation Management Assessment/Plan:      The target INR is 2 - 3.  The next INR is due 10/21/2009.  Anticoagulation instructions were given to patient.  Results were reviewed/authorized by Teressa Lower, RN.  She was notified by Teressa Lower RN.         Prior Anticoagulation Instructions: INR 2.0 Increase coumadin to 7.5mg  once daily except 10mg  on Mondays  Current Anticoagulation Instructions: INR 5.0 TODAY HOLD TODAY AND WEDNESDAY THEN RESUME 1.5 TABLETS DAILY UNTIL RECHEC ON 10/21/2009  Appended Document: protime/tg    Clinical Lists Changes  Medications: Added new medication of DIGOXIN 0.125 MG TABS (DIGOXIN) Take  one tablet by mouth daily Added new medication of POTASSIUM CHLORIDE CRYS CR 20 MEQ CR-TABS (POTASSIUM CHLORIDE CRYS CR) Take 1 tablet by mouth two times a day Added new medication of FUROSEMIDE 40 MG TABS (FUROSEMIDE) Take one tablet by mouth daily. Added new medication of DILT-CD 240 MG XR24H-CAP (DILTIAZEM HCL COATED BEADS) Take 1 tablet by mouth once a day Added new medication of WARFARIN SODIUM 5 MG TABS (WARFARIN SODIUM) Take 1 tablet by mouth once a day

## 2010-08-19 NOTE — Medication Information (Signed)
Summary: ccr-lr      Allergies Added:  Anticoagulant Therapy  Managed by: Teressa Lower, RN PCP: Dr. Carylon Perches Supervising MD: Dietrich Pates MD, Molly Maduro Indication 1: Atrial Fibrillation (ICD-427.31) Lab Used: [facility]  Site: [place] INR POC 2.5  Dietary changes: no    Health status changes: no    Bleeding/hemorrhagic complications: no    Recent/future hospitalizations: no    Any changes in medication regimen? no    Recent/future dental: no  Any missed doses?: no       Is patient compliant with meds? yes       Current Medications (verified): 1)  Digoxin 0.125 Mg Tabs (Digoxin) .... Take One Tablet By Mouth Daily 2)  Potassium Chloride Crys Cr 20 Meq Cr-Tabs (Potassium Chloride Crys Cr) .... Take 1 Tablet By Mouth Two Times A Day 3)  Furosemide 40 Mg Tabs (Furosemide) .... Take One Tablet By Mouth Daily. 4)  Dilt-Cd 240 Mg Xr24h-Cap (Diltiazem Hcl Coated Beads) .... Take 1 Tablet By Mouth Once A Day 5)  Warfarin Sodium 5 Mg Tabs (Warfarin Sodium) .... Take 1 Tablet By Mouth Once A Day  Allergies (verified): 1)  ! * Baycol  Anticoagulation Management History:      The patient is taking warfarin and comes in today for a routine follow up visit.  Positive risk factors for bleeding include an age of 75 years or older.  The bleeding index is 'intermediate risk'.  Positive CHADS2 values include History of HTN and Age > 3 years old.  The start date was 03/08/1997.  Anticoagulation responsible provider: Dietrich Pates MD, Molly Maduro.  INR POC: 2.5.  Cuvette Lot#: [id number].  Exp: 05/12.    Anticoagulation Management Assessment/Plan:      The patient's current anticoagulation dose is Warfarin sodium 5 mg tabs: Take 1 tablet by mouth once a day.  The target INR is 2 - 3.  The next INR is due 01/30/2010.  Anticoagulation instructions were given to patient.  Results were reviewed/authorized by Teressa Lower, RN.  She was notified by Teressa Lower RN.           Prior Anticoagulation Instructions: INR 1.8 Increase coumadin to 7.5mg  once daily except 10mg  on Mondays, Wednesdays and Fridays  Current Anticoagulation Instructions: INR 2.5 TODAY PLEASE CONTINUE CURRENT WARFARIN DOSETABLETS ON MONDAYS, WEDNESDAYS, AND FRIDAYS 1.5 TABLET ALL OTHER DAYS

## 2010-08-19 NOTE — Letter (Signed)
Summary: Appointment - Reminder 2  [facility] [place]. [address], [place] [address]   Phone: [phone]  Fax: [phone]     Nov 27, 2009 MRN: [id number]   [name] [address] [place], [place]  [address]   Dear [name],  Our records indicate that it is time to schedule a follow-up appointment.  Dr. Dietrich Pates         recommended that you follow up with [place] in   JANUARY 2011         . It is very important that we reach you to schedule this appointment. We look forward to participating in your health care needs. Please contact us at the number listed above at your earliest convenience to schedule your appointment.  If you are unable to make an appointment at this time, give [place] a call so we can update our records.     Sincerely,   [employer] Scheduling Team [phone]

## 2010-08-19 NOTE — Letter (Signed)
Summary: [place] Results Engineer, agricultural at [facility]  [address] [place] [address]   Phone: [phone]  Fax: [phone]      January 15, 2010 MRN: [id number]   [name] [address] [place], [place]  [address]   Dear Ms. Fesler,  Your test ordered by Selena Batten has been reviewed by your physician (or physician assistant) and was found to be normal or stable. Your physician (or physician assistant) felt no changes were needed at this time.  ____ Echocardiogram  ____ Cardiac Stress Test  _X___ Lab Work  ____ Peripheral vascular study of arms, legs or neck  ____ CT scan or X-ray  ____ Lung or Breathing test  ____ Other:  Please decrease digoxin to 0.125mg  daily and repeat labwork in 6 weeks.  Enclosed is a copy of your labwork for your records.  Thank you, Tammy Allyne Gee RN    [place] Bing, MD, Lenise Arena.C.Gaylord Shih, MD, F.A.C.C Lewayne Bunting, MD, F.A.C.C Nona Dell, MD, F.A.C.C Charlton Haws, MD, Lenise Arena.C.C

## 2010-08-19 NOTE — Medication Information (Signed)
Summary: ccr-lr  Anticoagulant Therapy  Managed by: Vashti Hey, RN PCP: Dr. Carylon Perches Supervising MD: Diona Browner MD, Remi Deter Indication 1: Atrial Fibrillation (ICD-427.31) Lab Used: [facility] [place] Site: [place] INR POC 2.2  Dietary changes: no    Health status changes: no    Bleeding/hemorrhagic complications: no    Recent/future hospitalizations: no    Any changes in medication regimen? no    Recent/future dental: no  Any missed doses?: no       Is patient compliant with meds? yes       Allergies: 1)  ! * Baycol  Anticoagulation Management History:      The patient is taking warfarin and comes in today for a routine follow up visit.  Positive risk factors for bleeding include an age of 22 years or older.  The bleeding index is 'intermediate risk'.  Positive CHADS2 values include History of HTN and Age > 63 years old.  The start date was 03/08/1997.  Anticoagulation responsible provider: Diona Browner MD, Remi Deter.  INR POC: 2.2.  Cuvette Lot#: [id number].  Exp: 05/12.    Anticoagulation Management Assessment/Plan:      The patient's current anticoagulation dose is Warfarin sodium 5 mg tabs: Take 1 tablet by mouth once a day.  The target INR is 2 - 3.  The next INR is due 05/08/2010.  Anticoagulation instructions were given to patient.  Results were reviewed/authorized by Vashti Hey, RN.  She was notified by Vashti Hey RN.         Prior Anticoagulation Instructions: INR 2.1 Continue coumadin 7.5mg  once daily except 10mg  on Mondays, Wednesdays and Fridays  Current Anticoagulation Instructions: INR 2.2 Continue coumadin 7.5mg  once daily except 10mg  on Mondays, Wednesdays and Fridays

## 2010-08-19 NOTE — Medication Information (Signed)
Summary: ccr-lr  Anticoagulant Therapy  Managed by: Vashti Hey, RN PCP: Dr. Carylon Perches Supervising MD: Diona Browner MD, Remi Deter Indication 1: Atrial Fibrillation (ICD-427.31) Lab Used: [facility] [place] Site: [place] INR POC 3.1  Dietary changes: no    Health status changes: no    Bleeding/hemorrhagic complications: no    Recent/future hospitalizations: no    Any changes in medication regimen? no    Recent/future dental: no  Any missed doses?: no       Is patient compliant with meds? yes       Allergies: 1)  ! * Baycol  Anticoagulation Management History:      The patient is taking warfarin and comes in today for a routine follow up visit.  Positive risk factors for bleeding include an age of 75 years or older.  The bleeding index is 'intermediate risk'.  Positive CHADS2 values include History of HTN and Age > 76 years old.  The start date was 03/08/1997.  Anticoagulation responsible provider: Diona Browner MD, Remi Deter.  INR POC: 3.1.  Cuvette Lot#: [id number].  Exp: 05/12.    Anticoagulation Management Assessment/Plan:      The patient's current anticoagulation dose is Warfarin sodium 5 mg tabs: Take 1 tablet by mouth once a day.  The target INR is 2 - 3.  The next INR is due 07/03/2010.  Anticoagulation instructions were given to patient.  Results were reviewed/authorized by Vashti Hey, RN.  She was notified by Vashti Hey RN.         Prior Anticoagulation Instructions: INR 3.0 Continue coumadin 7.5mg  once daily except 10mg  on Mondays, Wednesdays and Fridays  Current Anticoagulation Instructions: INR 3.1 Take coumadin 1 tablet tonight then resume 1 1/2 tablets once daily except 2 tablets on Mondays, Wednesdays and Fridays

## 2010-08-19 NOTE — Letter (Signed)
Summary: [place] [facility] Work Engineer, agricultural at [employer]  [address], [place] [address]   Phone: [phone]  Fax: [phone]     January 03, 2010 MRN: [id number]   [name] [address] [place], [place]  [address]      YOUR LAB WORK IS DUE  March 31, 2010 _________________________________________  Please go to [facility], located across the street from [facility] on the second floor.  Hours are Monday - Friday 7am until 7:30pm         Saturday 8am until 12noon    __  DO NOT EAT OR DRINK AFTER MIDNIGHT EVENING PRIOR TO LABWORK  _X_ YOUR LABWORK IS NOT FASTING --YOU MAY EAT PRIOR TO LABWORK

## 2010-08-19 NOTE — Medication Information (Signed)
Summary: ccr-lr  Anticoagulant Therapy  Managed by: Vashti Hey, RN Supervising MD: Dietrich Pates MD, Molly Maduro Indication 1: Atrial Fibrillation (ICD-427.31) Lab Used: [facility] [place] Site: [place] INR POC 1.8  Dietary changes: no    Health status changes: no    Bleeding/hemorrhagic complications: no    Recent/future hospitalizations: no    Any changes in medication regimen? no    Recent/future dental: no  Any missed doses?: no       Is patient compliant with meds? yes       Allergies: No Known Drug Allergies  Anticoagulation Management History:      The patient is taking warfarin and comes in today for a routine follow up visit.  Positive risk factors for bleeding include an age of 75 years or older.  The bleeding index is 'intermediate risk'.  Positive CHADS2 values include Age > 7 years old.  The start date was 03/08/1997.  Anticoagulation responsible provider: Dietrich Pates MD, Molly Maduro.  INR POC: 1.8.  Cuvette Lot#: [id number].  Exp: 05/12.    Anticoagulation Management Assessment/Plan:      The patient's current anticoagulation dose is Warfarin sodium 5 mg tabs: Take 1 tablet by mouth once a day.  The target INR is 2 - 3.  The next INR is due 12/02/2009.  Anticoagulation instructions were given to patient.  Results were reviewed/authorized by Vashti Hey, RN.  She was notified by Vashti Hey RN.         Prior Anticoagulation Instructions: INR 1.5 Take coumadin 2 1/2 tablets tonight and tomorrow night then resume 7.5mg  once daily   Current Anticoagulation Instructions: INR 1.8 Increase coumadin to 7.5mg  once daily except 10mg  on Thursdays

## 2010-08-19 NOTE — Medication Information (Signed)
Summary: ccr-lr  Anticoagulant Therapy  Managed by: Jenna Hey, RN Supervising MD: Dietrich Pates MD, Molly Maduro Indication 1: Atrial Fibrillation (ICD-427.31) Lab Used: [facility] [place] Site: [place] INR POC 1.8  Dietary changes: no    Health status changes: no    Bleeding/hemorrhagic complications: no    Recent/future hospitalizations: no    Any changes in medication regimen? no    Recent/future dental: no  Any missed doses?: no       Is patient compliant with meds? yes       Allergies: No Known Drug Allergies  Anticoagulation Management History:      The patient is taking warfarin and comes in today for a routine follow up visit.  Positive risk factors for bleeding include an age of 75 years or older.  The bleeding index is 'intermediate risk'.  Positive CHADS2 values include Age > 73 years old.  The start date was 03/08/1997.  Anticoagulation responsible provider: Dietrich Pates MD, Molly Maduro.  INR POC: 1.8.  Cuvette Lot#: [id number].  Exp: 05/12.    Anticoagulation Management Assessment/Plan:      The patient's current anticoagulation dose is Warfarin sodium 5 mg tabs: Take 1 tablet by mouth once a day.  The target INR is 2 - 3.  The next INR is due 12/25/2009.  Anticoagulation instructions were given to patient.  Results were reviewed/authorized by Jenna Hey, RN.  She was notified by Jenna Hey RN.         Prior Anticoagulation Instructions: INR 1.8 Increase coumadin to 7.5mg  once daily except 10mg  on Thursdays  Current Anticoagulation Instructions: INR 1.8 Increase coumadin to 7.5mg  once daily except 10mg  on Mondays, Wednesdays and Fridays

## 2010-08-19 NOTE — Assessment & Plan Note (Signed)
Summary: past due for f/u per pt /tg  Medications Added DIGOXIN 0.25 MG TABS (DIGOXIN) Take 1 tablet by mouth once a day      Allergies Added:   Visit Type:  Follow-up Primary Provider:  Dr. Carylon Perches   History of Present Illness: [name] returns to the office somewhat beyond her scheduled followup visit for continued assessment and treatment of coronary disease, cardiovascular risk factors and long-standing atrial fibrillation requiring anticoagulation.  Since her last visit, she has done generally well.  She has stable class III exertional dyspnea, but rarely experiences symptoms are due to a sedentary lifestyle.  She has an unsteady gait and walks with a walker, but has not fallen in quite some time.  She notes stiff ankles in the morning which he attributes to dependent edema.  She denies orthopnea, dyspnea at rest or PND.  She experiences no chest discomfort.     EKG  Procedure date:  01/03/2010  Findings:      Atrial fibrillation with controlled ventricular response and frequent PVCs occurring as 3 beat runs of multiform ventricular tachycardia.  Her right superior axis Pulmonary disease pattern Nonspecific ST-T wave abnormality Delayed R-wave progression Comparison with prior study of 07/29/07, PVCs now present; junctional rhythm with a right bundle branch block no longer noted   Current Medications (verified): 1)  Digoxin 0.25 Mg Tabs (Digoxin) .... Take 1 Tablet By Mouth Once A Day 2)  Potassium Chloride Crys Cr 20 Meq Cr-Tabs (Potassium Chloride Crys Cr) .... Take 1 Tablet By Mouth Two Times A Day 3)  Furosemide 40 Mg Tabs (Furosemide) .... Take One Tablet By Mouth Daily. 4)  Dilt-Cd 240 Mg Xr24h-Cap (Diltiazem Hcl Coated Beads) .... Take 1 Tablet By Mouth Once A Day 5)  Warfarin Sodium 5 Mg Tabs (Warfarin Sodium) .... Take 1 Tablet By Mouth Once A Day  Allergies (verified): 1)  ! * Baycol  Past History:  PMH, FH, and Social History reviewed and  updated.  Review of Systems       See history of present illness.  Vital Signs:  Patient profile:   77 year old female Height:      60 inches Weight:      161 pounds BMI:     31.56 Pulse rate:   59 / minute BP sitting:   111 / 58  (right arm)  Vitals Entered By: Dreama Saa, CNA (January 03, 2010 1:44 PM)  Physical Exam  General:  Mildly overweight; well developed; no acute distress:   Neck-No JVD; no carotid bruits: Lungs-No tachypnea, no rales; no rhonchi; no wheezes: Cardiovascular-normal PMI; normal S1 and S2; markedly irregular rhythm Abdomen-BS normal; soft and non-tender without masses or organomegaly:  Musculoskeletal-No deformities, no cyanosis or clubbing: Neurologic-Normal cranial nerves; symmetric strength and tone; unsteady, delivered, wide based gait.  Skin-Warm, no significant lesions: Extremities-Nl distal pulses; one-2+ pitting ankle and pretibial edema     Impression & Recommendations:  Problem # 1:  ATHEROSCLEROTIC CARDIOVASCULAR DISEASE (ICD-429.2) No symptoms at present to suggest recurrent myocardial ischemia.  She has done very well with no manifestations for coronary disease since intervention 13 years ago.  Continued use of appropriate antithrombotic agents and treatment of risk factors is our plan of action.  Problem # 2:  HYPERTENSION (ICD-401.1) Blood pressure control is excellent; current medications will be continued.  Problem # 3:  ATRIAL FIBRILLATION (ICD-427.31) Heart rate control is good.  There is considerable ventricular ectopy, but LV systolic function has been normal  in the past.  No modification will be made to our current therapeutic approach.  Due to the need for chronic anticoagulation, a CBC and stool for Hemoccult testing will be assessed.  A chemistry profile will be obtained as well.  There has been some confusion about the patient's dose of digoxin.  It appears that she is taking 0.25 mg per day even though we had previously  prescribed 125 mg.  A digoxin level will be obtained.  I will plan to see this nice woman again in 8 months.  Other Orders: EKG w/ Interpretation (93000) T-Digoxin ([id number]) T-Comprehensive Metabolic Panel ([id number]) T-CBC w/Diff ([id number]) Hemoccult Cards (Take Home) (Hemoccult Cards) Future Orders: T-Basic Metabolic Panel [phone]) ... 03/31/2010  Patient Instructions: 1)  Your physician recommends that you schedule a follow-up appointment in: 8 months 2)  Your physician recommends that you return for lab work in: today and in 3 months 3)  Your physician recommends that you start a low salt diet 4)  Your physician recommends that you elevate legs to reduce swelling 5)  Your physician has asked that you test your stool for blood. It is necessary to test 3 different stool specimens for accuracy. You will be given 3 hemoccult cards for specimen collection. For each stool specimen, place a small portion of stool sample (from 2 different areas of the stool) into the 2 squares on the card. Close card. Repeat with 2 more stool specimens. Bring the cards back to the office for testing.

## 2010-08-19 NOTE — Miscellaneous (Signed)
Summary: digoxin med change  Clinical Lists Changes  Medications: Changed medication from DIGOXIN 0.25 MG TABS (DIGOXIN) Take 1 tablet by mouth once a day to DIGOXIN 0.125 MG TABS (DIGOXIN) Take one tablet by mouth daily

## 2010-08-19 NOTE — Medication Information (Signed)
Summary: ccr-lr  Anticoagulant Therapy  Managed by: Vashti Hey, RN Supervising MD: Dietrich Pates MD, Molly Maduro Indication 1: Atrial Fibrillation (ICD-427.31) Lab Used: [facility] [place] Site: [place] INR POC 1.9  Dietary changes: no    Health status changes: no    Bleeding/hemorrhagic complications: no    Recent/future hospitalizations: no    Any changes in medication regimen? no    Recent/future dental: no  Any missed doses?: no       Is patient compliant with meds? yes       Anticoagulation Management History:      The patient is taking warfarin and comes in today for a routine follow up visit.  Positive risk factors for bleeding include an age of 75 years or older.  The bleeding index is 'intermediate risk'.  Positive CHADS2 values include Age > 43 years old.  The start date was 03/08/1997.  Anticoagulation responsible provider: Dietrich Pates MD, Molly Maduro.  INR POC: 1.9.  Cuvette Lot#: [id number].  Exp: 10/11.    Anticoagulation Management Assessment/Plan:      The target INR is 2 - 3.  The next INR is due 09/30/2009.  Anticoagulation instructions were given to patient.  Results were reviewed/authorized by Vashti Hey, RN.  She was notified by Vashti Hey RN.         Prior Anticoagulation Instructions: INR 1.4 Take coumadin 2 1/2 tablets tonight and tomorrow night then resume 1 1/2 tablets once daily except 1 tablet on Mondays   Current Anticoagulation Instructions: INR 1.9 Take coumadin 2 tablets tonight then increase dose to 1 1/2 tablets once daily

## 2010-08-19 NOTE — Medication Information (Signed)
Summary: ccr-lr  Anticoagulant Therapy  Managed by: Vashti Hey, RN Supervising MD: Dietrich Pates MD, Molly Maduro Indication 1: Atrial Fibrillation (ICD-427.31) Lab Used: [facility] [place] Site: [place] INR POC 1.9  Dietary changes: no    Health status changes: no    Bleeding/hemorrhagic complications: no    Recent/future hospitalizations: no    Any changes in medication regimen? no    Recent/future dental: no  Any missed doses?: yes     Details: missed 1 dose  Is patient compliant with meds? yes       Anticoagulation Management History:      The patient is taking warfarin and comes in today for a routine follow up visit.  Positive risk factors for bleeding include an age of 19 years or older.  The bleeding index is 'intermediate risk'.  Positive CHADS2 values include Age > 2 years old.  The start date was 03/08/1997.  Anticoagulation responsible provider: Dietrich Pates MD, Molly Maduro.  INR POC: 1.9.  Cuvette Lot#: [id number].  Exp: 10/11.    Anticoagulation Management Assessment/Plan:      The target INR is 2 - 3.  The next INR is due 08/22/2009.  Anticoagulation instructions were given to patient.  Results were reviewed/authorized by Vashti Hey, RN.  She was notified by Vashti Hey RN.         Prior Anticoagulation Instructions: INR 1.8 Take coumadin 2 1/2 tablets today then resume 1 1/2 tablets once daily except 1 tablet on Mondays and Fridays  Current Anticoagulation Instructions: INR 1.9 Take coumadin 2 tablets today then increase dose to 1 1/2 tablets once daily except 1 tablet on Mondays

## 2010-08-19 NOTE — Medication Information (Signed)
Summary: ccr-lr  Anticoagulant Therapy  Managed by: Vashti Hey, RN PCP: Dr. Carylon Perches Supervising MD: Dietrich Pates MD, Molly Maduro Indication 1: Atrial Fibrillation (ICD-427.31) Lab Used: [facility]  Site: [place] INR POC 3.0  Dietary changes: no    Health status changes: no    Bleeding/hemorrhagic complications: no    Recent/future hospitalizations: no    Any changes in medication regimen? no    Recent/future dental: no  Any missed doses?: no       Is patient compliant with meds? yes       Allergies: 1)  ! * Baycol  Anticoagulation Management History:      The patient is taking warfarin and comes in today for a routine follow up visit.  Positive risk factors for bleeding include an age of 75 years or older.  The bleeding index is 'intermediate risk'.  Positive CHADS2 values include History of HTN and Age > 75 years old.  The start date was 03/08/1997.  Anticoagulation responsible provider: Dietrich Pates MD, Molly Maduro.  INR POC: 3.0.  Cuvette Lot#: [id number].  Exp: 05/12.    Anticoagulation Management Assessment/Plan:      The patient's current anticoagulation dose is Warfarin sodium 5 mg tabs: Take 1 tablet by mouth once a day.  The target INR is 2 - 3.  The next INR is due 06/05/2010.  Anticoagulation instructions were given to patient.  Results were reviewed/authorized by Vashti Hey, RN.  She was notified by Vashti Hey RN.         Prior Anticoagulation Instructions: INR 2.2 Continue coumadin 7.5mg  once daily except 10mg  on Mondays, Wednesdays and Fridays  Current Anticoagulation Instructions: INR 3.0 Continue coumadin 7.5mg  once daily except 10mg  on Mondays, Wednesdays and Fridays

## 2010-08-19 NOTE — Letter (Signed)
Summary: [facility], [facility] at [employer]  [address], [place] [address]   Phone: [phone]  Fax: [phone]     January 15, 2010 MRN: [id number]   [name] [address] [place], [place]  [address]      YOUR LAB WORK IS DUE   February 26, 2010  Please go to [facility], located across the street from [facility] on the second floor.  Hours are Monday - Friday 7am until 7:30pm         Saturday 8am until 12noon    __  DO NOT EAT OR DRINK AFTER MIDNIGHT EVENING PRIOR TO LABWORK  _X_ YOUR LABWORK IS NOT FASTING --YOU MAY EAT PRIOR TO LABWORK

## 2010-08-19 NOTE — Letter (Signed)
Summary: Handout Printed  Printed Handout:  - Diet - Sodium-Controlled 

## 2010-08-19 NOTE — Medication Information (Signed)
Summary: ccr-lr  Anticoagulant Therapy  Managed by: Vashti Hey, RN Supervising MD: Dietrich Pates MD, Molly Maduro Indication 1: Atrial Fibrillation (ICD-427.31) Lab Used: [facility] [place] Site: [place] INR POC 2.0  Dietary changes: no    Health status changes: no    Bleeding/hemorrhagic complications: no    Recent/future hospitalizations: no    Any changes in medication regimen? no    Recent/future dental: no  Any missed doses?: no       Is patient compliant with meds? yes       Anticoagulation Management History:      The patient is taking warfarin and comes in today for a routine follow up visit.  Positive risk factors for bleeding include an age of 15 years or older.  The bleeding index is 'intermediate risk'.  Positive CHADS2 values include Age > 31 years old.  The start date was 03/08/1997.  Anticoagulation responsible provider: Dietrich Pates MD, Molly Maduro.  INR POC: 2.0.  Cuvette Lot#: [id number].  Exp: 10/11.    Anticoagulation Management Assessment/Plan:      The target INR is 2 - 3.  The next INR is due 10/28/2009.  Anticoagulation instructions were given to patient.  Results were reviewed/authorized by Vashti Hey, RN.  She was notified by Vashti Hey RN.         Prior Anticoagulation Instructions: INR 1.9 Take coumadin 2 tablets tonight then increase dose to 1 1/2 tablets once daily   Current Anticoagulation Instructions: INR 2.0 Increase coumadin to 7.5mg  once daily except 10mg  on Mondays

## 2010-08-21 NOTE — Medication Information (Signed)
Summary: ccr-lr  Anticoagulant Therapy  Managed by: Vashti Hey, RN PCP: Dr. Carylon Perches Supervising MD: Dietrich Pates MD, Molly Maduro Indication 1: Atrial Fibrillation (ICD-427.31) Lab Used: [facility] [place] Site: [place] INR POC 1.5  Dietary changes: no    Health status changes: yes       Details: Has compression Fx   Bleeding/hemorrhagic complications: no    Recent/future hospitalizations: no    Any changes in medication regimen? yes       Details: been on more pain meds for back    Recent/future dental: no  Any missed doses?: yes     Details: Missed several doses and cut back to 1 tablet qd on her own  Is patient compliant with meds? yes       Allergies: 1)  ! * Baycol  Anticoagulation Management History:      The patient is taking warfarin and comes in today for a routine follow up visit.  Positive risk factors for bleeding include an age of 75 years or older.  The bleeding index is 'intermediate risk'.  Positive CHADS2 values include History of HTN and Age > 13 years old.  The start date was 03/08/1997.  Anticoagulation responsible provider: Dietrich Pates MD, Molly Maduro.  INR POC: 1.5.  Cuvette Lot#: [id number].  Exp: 05/12.    Anticoagulation Management Assessment/Plan:      The patient's current anticoagulation dose is Warfarin sodium 5 mg tabs: Take 1 tablet by mouth once a day.  The target INR is 2 - 3.  The next INR is due 08/25/2010.  Anticoagulation instructions were given to patient.  Results were reviewed/authorized by Vashti Hey, RN.  She was notified by Vashti Hey RN.         Prior Anticoagulation Instructions: INR 2.4 Take 1 tablet once daily until finished with prednisone then resume 1 1/2 tablets once daily except 2 tablets on Mondays, Wednesdays and Fridays  Current Anticoagulation Instructions: INR 1.5 Take 2 tablets tonight then resume 7.5mg  once daily except 10mg  on Mondays, Wednesdays and Fridays

## 2010-08-21 NOTE — Medication Information (Signed)
Summary: ccr-lr  Anticoagulant Therapy  Managed by: Vashti Hey, RN PCP: Dr. Carylon Perches Supervising MD: Dietrich Pates MD, Molly Maduro Indication 1: Atrial Fibrillation (ICD-427.31) Lab Used: [facility] [place] Site: [place] INR POC 2.4  Dietary changes: no    Health status changes: yes       Details: gout in hand  Bleeding/hemorrhagic complications: yes       Details: had nose bleed  Recent/future hospitalizations: no    Any changes in medication regimen? yes       Details: On prednisone taper for gout  Recent/future dental: no  Any missed doses?: yes     Details: decreased coumadin to 1 tablet qd due to nose bleed  Is patient compliant with meds? yes       Allergies: 1)  ! * Baycol  Anticoagulation Management History:      The patient is taking warfarin and comes in today for a routine follow up visit.  Positive risk factors for bleeding include an age of 75 years or older.  The bleeding index is 'intermediate risk'.  Positive CHADS2 values include History of HTN and Age > 36 years old.  The start date was 03/08/1997.  Anticoagulation responsible provider: Dietrich Pates MD, Molly Maduro.  INR POC: 2.4.  Cuvette Lot#: [id number].  Exp: 05/12.    Anticoagulation Management Assessment/Plan:      The patient's current anticoagulation dose is Warfarin sodium 5 mg tabs: Take 1 tablet by mouth once a day.  The target INR is 2 - 3.  The next INR is due 08/14/2010.  Anticoagulation instructions were given to patient.  Results were reviewed/authorized by Vashti Hey, RN.  She was notified by Vashti Hey RN.         Prior Anticoagulation Instructions: INR 2.8 On prednisone dose pack Take coumadin 1 1/2 tablets once daily except 1 tablet on Mondays, Wednesdays and Fridays till finished prednisone then resume regular dose  Current Anticoagulation Instructions: INR 2.4 Take 1 tablet once daily until finished with prednisone then resume 1 1/2 tablets once daily except 2 tablets on  Mondays, Wednesdays and Fridays

## 2010-08-21 NOTE — Medication Information (Signed)
Summary: ccr-lr  Anticoagulant Therapy  Managed by: Vashti Hey, RN PCP: Dr. Carylon Perches Supervising MD: Diona Browner MD, Remi Deter Indication 1: Atrial Fibrillation (ICD-427.31) Lab Used: [facility] [place] Site: [place] INR POC 2.8  Dietary changes: no    Health status changes: yes       Details: has gout in finger  Bleeding/hemorrhagic complications: no    Recent/future hospitalizations: no    Any changes in medication regimen? yes       Details: started on prednisone taper pack yesterday  Recent/future dental: no  Any missed doses?: yes     Details: missed 2 doses  Is patient compliant with meds? yes       Allergies: 1)  ! * Baycol  Anticoagulation Management History:      The patient is taking warfarin and comes in today for a routine follow up visit.  Positive risk factors for bleeding include an age of 75 years or older.  The bleeding index is 'intermediate risk'.  Positive CHADS2 values include History of HTN and Age > 9 years old.  The start date was 03/08/1997.  Anticoagulation responsible provider: Diona Browner MD, Remi Deter.  INR POC: 2.8.  Cuvette Lot#: [id number].  Exp: 05/12.    Anticoagulation Management Assessment/Plan:      The patient's current anticoagulation dose is Warfarin sodium 5 mg tabs: Take 1 tablet by mouth once a day.  The target INR is 2 - 3.  The next INR is due 07/23/2010.  Anticoagulation instructions were given to patient.  Results were reviewed/authorized by Vashti Hey, RN.  She was notified by Vashti Hey RN.         Prior Anticoagulation Instructions: INR 3.1 Take coumadin 1 tablet tonight then resume 1 1/2 tablets once daily except 2 tablets on Mondays, Wednesdays and Fridays  Current Anticoagulation Instructions: INR 2.8 On prednisone dose pack Take coumadin 1 1/2 tablets once daily except 1 tablet on Mondays, Wednesdays and Fridays till finished prednisone then resume regular dose

## 2010-08-27 ENCOUNTER — Encounter (INDEPENDENT_AMBULATORY_CARE_PROVIDER_SITE_OTHER): Payer: Medicare Other

## 2010-08-27 ENCOUNTER — Encounter: Payer: Self-pay | Admitting: Cardiology

## 2010-08-27 DIAGNOSIS — I4891 Unspecified atrial fibrillation: Secondary | ICD-10-CM

## 2010-08-27 DIAGNOSIS — Z7901 Long term (current) use of anticoagulants: Secondary | ICD-10-CM

## 2010-08-27 LAB — CONVERTED CEMR LAB: POC INR: 3.6

## 2010-09-01 ENCOUNTER — Emergency Department (HOSPITAL_COMMUNITY)
Admission: EM | Admit: 2010-09-01 | Discharge: 2010-09-01 | Disposition: A | Discharge status: Home / Self Care | Payer: Medicare Other | Attending: Emergency Medicine | Admitting: Emergency Medicine

## 2010-09-01 DIAGNOSIS — I4891 Unspecified atrial fibrillation: Secondary | ICD-10-CM | POA: Insufficient documentation

## 2010-09-01 DIAGNOSIS — Z79899 Other long term (current) drug therapy: Secondary | ICD-10-CM | POA: Insufficient documentation

## 2010-09-01 DIAGNOSIS — I2510 Atherosclerotic heart disease of native coronary artery without angina pectoris: Secondary | ICD-10-CM | POA: Insufficient documentation

## 2010-09-01 DIAGNOSIS — M545 Low back pain, unspecified: Secondary | ICD-10-CM | POA: Insufficient documentation

## 2010-09-01 DIAGNOSIS — M8448XA Pathological fracture, other site, initial encounter for fracture: Secondary | ICD-10-CM | POA: Insufficient documentation

## 2010-09-01 DIAGNOSIS — R1084 Generalized abdominal pain: Secondary | ICD-10-CM | POA: Insufficient documentation

## 2010-09-02 ENCOUNTER — Other Ambulatory Visit (HOSPITAL_COMMUNITY): Payer: Self-pay | Admitting: Orthopedic Surgery

## 2010-09-02 DIAGNOSIS — M545 Low back pain: Secondary | ICD-10-CM

## 2010-09-04 ENCOUNTER — Ambulatory Visit (HOSPITAL_COMMUNITY)
Admission: RE | Admit: 2010-09-04 | Discharge: 2010-09-04 | Disposition: A | Discharge status: Home / Self Care | Payer: Medicare Other | Source: Ambulatory Visit | Attending: Orthopedic Surgery | Admitting: Orthopedic Surgery

## 2010-09-04 DIAGNOSIS — Z87311 Personal history of (healed) other pathological fracture: Secondary | ICD-10-CM | POA: Insufficient documentation

## 2010-09-04 DIAGNOSIS — M545 Low back pain, unspecified: Secondary | ICD-10-CM | POA: Insufficient documentation

## 2010-09-04 NOTE — Medication Information (Signed)
Summary: ccr-lr  Lab Visit  Orders Today:  Anticoagulant Therapy  Managed by: Vashti Hey, RN PCP: Dr. Carylon Perches Supervising MD: Dietrich Pates MD, Molly Maduro Indication 1: Atrial Fibrillation (ICD-427.31) Lab Used: [facility] [place] Site: [place] INR POC 3.6  Dietary changes: no    Health status changes: no    Bleeding/hemorrhagic complications: no    Recent/future hospitalizations: no    Any changes in medication regimen? no    Recent/future dental: no  Any missed doses?: yes     Details: Missed 1 dose 2 weeks ago  Is patient compliant with meds? yes         Anticoagulation Management History:      The patient is taking warfarin and comes in today for a routine follow up visit.  Positive risk factors for bleeding include an age of 75 years or older.  The bleeding index is 'intermediate risk'.  Positive CHADS2 values include History of HTN and Age > 8 years old.  The start date was 03/08/1997.  Anticoagulation responsible provider: Dietrich Pates MD, Molly Maduro.  INR POC: 3.6.  Cuvette Lot#: [id number].  Exp: 05/12.    Anticoagulation Management Assessment/Plan:      The patient's current anticoagulation dose is Warfarin sodium 5 mg tabs: Take 1 tablet by mouth once a day.  The target INR is 2 - 3.  The next INR is due 09/24/2010.  Anticoagulation instructions were given to patient.  Results were reviewed/authorized by Vashti Hey, RN.  She was notified by Vashti Hey RN.         Prior Anticoagulation Instructions: INR 1.5 Take 2 tablets tonight then resume 7.5mg  once daily except 10mg  on Mondays, Wednesdays and Fridays  Current Anticoagulation Instructions: INR 3.6 Take coumadin 1/2 tablet tonight then resume 1 1/2 tablets once daily except 2 tablets on Mondays, Wednesdays and Fridays

## 2010-09-15 ENCOUNTER — Encounter: Payer: Self-pay | Admitting: Cardiology

## 2010-09-24 ENCOUNTER — Encounter (INDEPENDENT_AMBULATORY_CARE_PROVIDER_SITE_OTHER): Payer: Medicare Other

## 2010-09-24 ENCOUNTER — Encounter: Payer: Self-pay | Admitting: Cardiovascular Disease

## 2010-09-24 DIAGNOSIS — I4891 Unspecified atrial fibrillation: Secondary | ICD-10-CM

## 2010-09-24 DIAGNOSIS — Z7901 Long term (current) use of anticoagulants: Secondary | ICD-10-CM

## 2010-09-24 LAB — CONVERTED CEMR LAB: POC INR: 3

## 2010-09-25 NOTE — Letter (Signed)
Summary: [facility]  [facility]   Imported By: Faythe Ghee 09/15/2010 11:18:15  _____________________________________________________________________  External Attachment:    Type:   Image     Comment:   External Document

## 2010-09-30 NOTE — Medication Information (Signed)
Summary: [id number]-lr  Anticoagulant Therapy  Managed by: Vashti Hey, RN PCP: Dr. Carylon Perches Supervising MD: Eden Emms MD, Theron Arista Indication 1: Atrial Fibrillation (ICD-427.31) Lab Used: [facility] [place] Site: [place] INR POC 3.0  Dietary changes: no    Health status changes: yes       Details: lot of back pain  Has been taking more pain med but cut back 2 days ago  Bleeding/hemorrhagic complications: no    Recent/future hospitalizations: no    Any changes in medication regimen? no    Recent/future dental: no  Any missed doses?: yes     Details: has missed doses   Is patient compliant with meds? yes       Allergies: 1)  ! * Baycol  Anticoagulation Management History:      The patient is taking warfarin and comes in today for a routine follow up visit.  Positive risk factors for bleeding include an age of 30 years or older.  The bleeding index is 'intermediate risk'.  Positive CHADS2 values include History of HTN and Age > 21 years old.  The start date was 03/08/1997.  Anticoagulation responsible provider: Eden Emms MD, Theron Arista.  INR POC: 3.0.  Cuvette Lot#: [id number].  Exp: 05/12.    Anticoagulation Management Assessment/Plan:      The patient's current anticoagulation dose is Warfarin sodium 5 mg tabs: Take 1 tablet by mouth once a day.  The target INR is 2 - 3.  The next INR is due 10/22/2010.  Anticoagulation instructions were given to patient.  Results were reviewed/authorized by Vashti Hey, RN.  She was notified by Vashti Hey RN.         Prior Anticoagulation Instructions: INR 3.6 Take coumadin 1/2 tablet tonight then resume 1 1/2 tablets once daily except 2 tablets on Mondays, Wednesdays and Fridays  Current Anticoagulation Instructions: INR 3.0 Continue coumadin 7.5mg  once daily except 10mg  on Mondays, Wednesdays and Fridays

## 2010-10-10 LAB — COMPREHENSIVE METABOLIC PANEL
ALT: 21 U/L (ref 0–35)
AST: 18 U/L (ref 0–37)
Albumin: 3.3 g/dL — ABNORMAL LOW (ref 3.5–5.2)
Alkaline Phosphatase: 56 U/L (ref 39–117)
Alkaline Phosphatase: 66 U/L (ref 39–117)
BUN: 16 mg/dL (ref 6–23)
BUN: 17 mg/dL (ref 6–23)
CO2: 21 mEq/L (ref 19–32)
Calcium: 8.6 mg/dL (ref 8.4–10.5)
Chloride: 103 mEq/L (ref 96–112)
Chloride: 104 mEq/L (ref 96–112)
Creatinine, Ser: 0.89 mg/dL (ref 0.4–1.2)
GFR calc Af Amer: >60 mL/min (ref >60)
GFR calc non Af Amer: 50 mL/min — ABNORMAL LOW (ref >60)
GFR calc non Af Amer: >60 mL/min (ref >60)
Glucose, Bld: 114 mg/dL — ABNORMAL HIGH (ref 70–99)
Glucose, Bld: 94 mg/dL (ref 70–99)
Glucose, Bld: 97 mg/dL (ref 70–99)
Potassium: 3.4 mEq/L — ABNORMAL LOW (ref 3.5–5.1)
Potassium: 3.7 mEq/L (ref 3.5–5.1)
Sodium: 131 mEq/L — ABNORMAL LOW (ref 135–145)
Total Bilirubin: 0.8 mg/dL (ref 0.3–1.2)
Total Bilirubin: 1.3 mg/dL — ABNORMAL HIGH (ref 0.3–1.2)
Total Protein: 6.9 g/dL (ref 6.0–8.3)

## 2010-10-10 LAB — DIFFERENTIAL
Basophils Absolute: 0 10*3/uL (ref 0.0–0.1)
Basophils Absolute: 0 10*3/uL (ref 0.0–0.1)
Basophils Absolute: 0 10*3/uL (ref 0.0–0.1)
Basophils Absolute: 0 10*3/uL (ref 0.0–0.1)
Basophils Relative: 0 % (ref 0–1)
Basophils Relative: 0 % (ref 0–1)
Basophils Relative: 0 % (ref 0–1)
Eosinophils Absolute: 0.8 10*3/uL — ABNORMAL HIGH (ref 0.0–0.7)
Eosinophils Relative: 3 % (ref 0–5)
Eosinophils Relative: 5 % (ref 0–5)
Lymphocytes Relative: 15 % (ref 12–46)
Lymphocytes Relative: 7 % — ABNORMAL LOW (ref 12–46)
Lymphs Abs: 1.1 10*3/uL (ref 0.7–4.0)
Lymphs Abs: 1.5 10*3/uL (ref 0.7–4.0)
Monocytes Absolute: 2.5 10*3/uL — ABNORMAL HIGH (ref 0.1–1.0)
Monocytes Absolute: 5.9 10*3/uL — ABNORMAL HIGH (ref 0.1–1.0)
Monocytes Relative: 21 % — ABNORMAL HIGH (ref 3–12)
Neutro Abs: 11.1 10*3/uL — ABNORMAL HIGH (ref 1.7–7.7)
Neutro Abs: 8.2 10*3/uL — ABNORMAL HIGH (ref 1.7–7.7)
Neutro Abs: 9.2 10*3/uL — ABNORMAL HIGH (ref 1.7–7.7)
Neutrophils Relative %: 53 % (ref 43–77)
Neutrophils Relative %: 57 % (ref 43–77)
Neutrophils Relative %: 61 % (ref 43–77)
Neutrophils Relative %: 73 % (ref 43–77)
Smear Review: DECREASED

## 2010-10-10 LAB — POCT CARDIAC MARKERS
CKMB, poc: <1 ng/mL — ABNORMAL LOW (ref 1.0–8.0)
Myoglobin, poc: 34.2 ng/mL (ref 12–200)
Troponin i, poc: <0.05 ng/mL (ref 0.00–0.09)

## 2010-10-10 LAB — PROTIME-INR
INR: 1.75 — ABNORMAL HIGH (ref 0.00–1.49)
INR: 3.29 — ABNORMAL HIGH (ref 0.00–1.49)
Prothrombin Time: 20.3 seconds — ABNORMAL HIGH (ref 11.6–15.2)
Prothrombin Time: 27.4 seconds — ABNORMAL HIGH (ref 11.6–15.2)
Prothrombin Time: 33.2 seconds — ABNORMAL HIGH (ref 11.6–15.2)

## 2010-10-10 LAB — URINALYSIS, ROUTINE W REFLEX MICROSCOPIC
Bilirubin Urine: NEGATIVE
Glucose, UA: NEGATIVE mg/dL
Ketones, ur: NEGATIVE mg/dL
Leukocytes, UA: NEGATIVE
Nitrite: NEGATIVE
Urobilinogen, UA: 0.2 mg/dL (ref 0.0–1.0)
pH: 5.5 (ref 5.0–8.0)

## 2010-10-10 LAB — BASIC METABOLIC PANEL
Chloride: 103 mEq/L (ref 96–112)
GFR calc non Af Amer: 56 mL/min — ABNORMAL LOW (ref >60)
Glucose, Bld: 98 mg/dL (ref 70–99)
Potassium: 3.2 mEq/L — ABNORMAL LOW (ref 3.5–5.1)
Sodium: 137 mEq/L (ref 135–145)

## 2010-10-10 LAB — URINE MICROSCOPIC-ADD ON

## 2010-10-10 LAB — CBC
HCT: 39.5 % (ref 36.0–46.0)
HCT: 40.5 % (ref 36.0–46.0)
HCT: 42.5 % (ref 36.0–46.0)
Hemoglobin: 13.7 g/dL (ref 12.0–15.0)
Hemoglobin: 14 g/dL (ref 12.0–15.0)
Hemoglobin: 14.7 g/dL (ref 12.0–15.0)
Hemoglobin: 15.4 g/dL — ABNORMAL HIGH (ref 12.0–15.0)
MCHC: 34.3 g/dL (ref 30.0–36.0)
MCHC: 34.5 g/dL (ref 30.0–36.0)
MCV: 94.4 fL (ref 78.0–100.0)
MCV: 94.7 fL (ref 78.0–100.0)
MCV: 94.7 fL (ref 78.0–100.0)
Platelets: 140 10*3/uL — ABNORMAL LOW (ref 150–400)
RBC: 4.51 MIL/uL (ref 3.87–5.11)
RBC: 4.73 MIL/uL (ref 3.87–5.11)
RDW: 13.9 % (ref 11.5–15.5)
WBC: 15.1 10*3/uL — ABNORMAL HIGH (ref 4.0–10.5)
WBC: 19.3 10*3/uL — ABNORMAL HIGH (ref 4.0–10.5)

## 2010-10-10 LAB — CULTURE, BLOOD (ROUTINE X 2)
Culture: NO GROWTH
Report Status: 3262011
Report Status: 3262011

## 2010-10-10 LAB — LACTIC ACID, PLASMA: Lactic Acid, Venous: 1.1 mmol/L (ref 0.5–2.2)

## 2010-10-10 LAB — URINE CULTURE: Colony Count: NO GROWTH

## 2010-10-10 LAB — DIGOXIN LEVEL: Digoxin Level: 1.7 ng/mL (ref 0.8–2.0)

## 2010-10-10 LAB — MONONUCLEOSIS SCREEN: Mono Screen: POSITIVE — AB

## 2010-10-10 LAB — APTT: aPTT: 38 seconds — ABNORMAL HIGH (ref 24–37)

## 2010-10-17 ENCOUNTER — Encounter: Payer: Self-pay | Admitting: Cardiology

## 2010-10-17 DIAGNOSIS — Z7901 Long term (current) use of anticoagulants: Secondary | ICD-10-CM | POA: Insufficient documentation

## 2010-10-17 DIAGNOSIS — I4891 Unspecified atrial fibrillation: Secondary | ICD-10-CM

## 2010-10-22 ENCOUNTER — Ambulatory Visit (INDEPENDENT_AMBULATORY_CARE_PROVIDER_SITE_OTHER): Payer: Medicare Other | Admitting: *Deleted

## 2010-10-22 DIAGNOSIS — I4891 Unspecified atrial fibrillation: Secondary | ICD-10-CM

## 2010-10-22 DIAGNOSIS — Z7901 Long term (current) use of anticoagulants: Secondary | ICD-10-CM

## 2010-11-04 LAB — URINALYSIS, ROUTINE W REFLEX MICROSCOPIC
Bilirubin Urine: NEGATIVE
Glucose, UA: NEGATIVE mg/dL
Ketones, ur: NEGATIVE mg/dL
Leukocytes, UA: NEGATIVE
Protein, ur: 30 mg/dL — AB
pH: 5.5 (ref 5.0–8.0)

## 2010-11-04 LAB — DIFFERENTIAL
Basophils Absolute: 0 10*3/uL (ref 0.0–0.1)
Basophils Relative: 0 % (ref 0–1)
Eosinophils Relative: 0 % (ref 0–5)
Lymphocytes Relative: 2 % — ABNORMAL LOW (ref 12–46)
Monocytes Absolute: 2 10*3/uL — ABNORMAL HIGH (ref 0.1–1.0)
Neutro Abs: 18 10*3/uL — ABNORMAL HIGH (ref 1.7–7.7)

## 2010-11-04 LAB — CBC
MCHC: 33.9 g/dL (ref 30.0–36.0)
Platelets: 125 10*3/uL — ABNORMAL LOW (ref 150–400)
RDW: 13 % (ref 11.5–15.5)

## 2010-11-04 LAB — BASIC METABOLIC PANEL
BUN: 18 mg/dL (ref 6–23)
CO2: 27 mEq/L (ref 19–32)
Calcium: 8.6 mg/dL (ref 8.4–10.5)
GFR calc non Af Amer: >60 mL/min (ref >60)
Glucose, Bld: 173 mg/dL — ABNORMAL HIGH (ref 70–99)

## 2010-11-04 LAB — DIGOXIN LEVEL: Digoxin Level: 0.9 ng/mL (ref 0.8–2.0)

## 2010-11-04 LAB — URINE MICROSCOPIC-ADD ON

## 2010-11-19 ENCOUNTER — Ambulatory Visit (INDEPENDENT_AMBULATORY_CARE_PROVIDER_SITE_OTHER): Payer: Medicare Other | Admitting: *Deleted

## 2010-11-19 DIAGNOSIS — Z7901 Long term (current) use of anticoagulants: Secondary | ICD-10-CM

## 2010-11-19 DIAGNOSIS — I4891 Unspecified atrial fibrillation: Secondary | ICD-10-CM

## 2010-11-19 LAB — POCT INR: INR: 2.9

## 2010-11-27 ENCOUNTER — Other Ambulatory Visit (HOSPITAL_COMMUNITY): Payer: Self-pay | Admitting: Internal Medicine

## 2010-11-27 ENCOUNTER — Ambulatory Visit (HOSPITAL_COMMUNITY)
Admission: RE | Admit: 2010-11-27 | Discharge: 2010-11-27 | Disposition: A | Discharge status: Home / Self Care | Payer: Medicare Other | Source: Ambulatory Visit | Attending: Internal Medicine | Admitting: Internal Medicine

## 2010-11-27 DIAGNOSIS — R05 Cough: Secondary | ICD-10-CM

## 2010-11-27 DIAGNOSIS — M949 Disorder of cartilage, unspecified: Secondary | ICD-10-CM | POA: Insufficient documentation

## 2010-11-27 DIAGNOSIS — M25559 Pain in unspecified hip: Secondary | ICD-10-CM | POA: Insufficient documentation

## 2010-11-27 DIAGNOSIS — R52 Pain, unspecified: Secondary | ICD-10-CM

## 2010-11-27 DIAGNOSIS — M899 Disorder of bone, unspecified: Secondary | ICD-10-CM | POA: Insufficient documentation

## 2010-12-02 NOTE — Discharge Summary (Signed)
[name], [name]              ACCOUNT NO.:  [id number]   MEDICAL RECORD NO.:  [id number]          PATIENT TYPE:  INP   LOCATION:  A316                          FACILITY:  [facility]   PHYSICIAN:  Catalina Pizza, M.D.        DATE OF BIRTH:  03/28/1927   DATE OF ADMISSION:  02/14/2008  DATE OF DISCHARGE:  08/01/2009LH                               DISCHARGE SUMMARY   DISCHARGE DIAGNOSES:  1. Urinary tract infection, E. coli with multiple resistance.  2. Mild right hydronephrosis with ureteral calculus obstruction.  3. Chronic atrial fibrillation.  4. Chronic Coumadin.  5. Thrombocytopenia.  6. Splenomegaly.  7. Chronic leukocytosis.   DISCHARGE MEDICATIONS:  1. Potassium 20 mEq b.i.d.  2. Diltiazem 240 mg p.o. daily.  3. Digoxin 0.125 mg once daily.  4. Lasix 40 mg p.o. daily.  5. Septra DS 1 tablet b.i.d.  6. The patient is to hold on Coumadin for now.   BRIEF HISTORY OF PRESENT ILLNESS:  [name] is a 77 year old white  female who apparently had a sloping down at her home of unknown exact  cause, but did have previous renal colic several days prior to this with  obstructing stone in the right ureter.  She did have low-grade  temperature and felt to have urinary tract infection, and was admitted  for further evaluation both from Urology as well as treatment of urinary  tract infection.   PHYSICAL EXAMINATION:  At the time of discharge:  VITAL SIGNS:  Temperature is 97.3, blood pressure 119/65, pulse 84,  respirations 16, and satting 95% on room air.  GENERAL:  This is an elderly female lying, in bed in no acute distress.  HEENT:  Unremarkable.  Pupils equal, round, reactive to light and  accommodation.  No scleral icterus.  NECK:  Supple.  No JVD.  LUNGS:  Clear to auscultation bilaterally.  HEART:  Irregularly irregular, but slow.  ABDOMEN:  Soft and nontender.  Positive bowel sounds.  I do not  appreciate any hepatosplenomegaly at this time.  EXTREMITIES:  No lower  extremity edema.  She does have few ecchymotic  areas from blood draws on arms, some trace lower extremity edema.  Question whether this is somewhat chronic.  NEUROLOGIC:  The patient is alert and oriented x3.  Moving all  extremities without difficulty and ambulating in the hall.  Gait is  stable with a four-prong cane.   Laboratory data obtained during the hospitalization, initial CBC showed  white count of 14.7, hemoglobin of 14.4, and platelet count of 60.  Platelet count trended down to 29,000 during the hospitalization, and  CBC on the day of discharge showed white count of 16.2, hemoglobin of  12.3, and platelet count of 60.  Initial PT was 24.1, INR of 2.0.  At  the time of discharge, protime is 28.7 and INR of 2.5.  Blood cultures  x2 were negative to date.  Digoxin level obtained during the  hospitalization showed elevation of 2.2.  Urine culture showed E. coli  rate of 100,000 colonies that was resistant to Cipro, Levaquin and  ampicillin.  Scans done during the hospitalization, KUB showed previously identified  distal right ureteral calculus is no longer identified on February 16, 2008,  compared to the previous CT scan.   HOSPITAL COURSE PER PROBLEM:  1. UTI with E. coli.  The patient was initially started on Cipro 250      mg b.i.d. and did not have any further fever.  Urine culture came      back yesterday afternoon with showing that it is resistant to      Cipro.  For this reason and because she is allergic to [facility], we      will start her on Septra b.i.d. for 5 days since E. coli is      responsive to this.  2. Hydronephrosis/urethral calculi.  On x-ray, did not show apparent      ureteral calculi.  She has not had any further colicky type pain at      this time, but Dr. Rito Ehrlich is to follow with the patient in      approximately 3 days on February 20, 2008, and if still signs of stone      appear, then may need further testing or procedure done at that      time.   For this reason, Dr. Rito Ehrlich and Dr. Ouida Sills want her to be      on Coumadin for bleeding risk.  3. Atrial fibrillation.  Her heart rate is slower at this time and has      not received any Coumadin in the last several days, but continues      to be therapeutic on her Coumadin level probably due to provocation      from the Cipro.  She is to continue to hold on this until further      evaluation by Dr. Rito Ehrlich  and Dr. Ouida Sills in the first week.  4. Mildly elevated digoxin level.  The patient's dose was cut in half      and will continue on that regimen.  Heart rate appears to be slow      and blood pressure doing well on the decreased dose of digoxin.  We      will continue with this.  5. Mild hyponatremia.  Initially when come in to the hospital she had      mild hyponatremia of 132, but this resolved back to normal the day      before discharge.  Sodium was 139.  6. Hypokalemia.  During the hospitalization, potassium did get down to      3.2 and this is slowly increased back up to 4.1 on the day of      discharge.   DISPOSITION:  The patient will be discharged to home to increase her  activity slowly and to follow up with Dr. Ouida Sills as well as Dr. Rito Ehrlich  in several days.      Catalina Pizza, M.D.  Electronically Signed     ZH/MEDQ  D:  02/18/2008  T:  02/19/2008  Job:  [id number]   cc:   Dennie Maizes, M.D.  Fax: [phone]   Kingsley Callander. Ouida Sills, MD  Fax: [phone]

## 2010-12-02 NOTE — Consult Note (Signed)
[name], [name]:  [id number]   MEDICAL RECORD NO.:  [id number]          PATIENT TYPE:  INP   LOCATION:  A316                          FACILITY:  [facility]   PHYSICIAN:  Dennie Maizes, M.D.   DATE OF BIRTH:  07-20-1927   DATE OF CONSULTATION:  02/15/2008  DATE OF DISCHARGE:                                 CONSULTATION   REASON FOR CONSULTATION:  Right distal ureteral calculus obstruction.   CONSULTATION REPORT:  This 77 year old female has a history of recurrent  urolithiasis.  She has had kidney stones for about 30 years.  She has  undergone kidney surgery x2 on the left side about 30 years ago.  She  has passed several stones.  She came to the hospital emergency room  about 5 days ago with severe right flank pain and right lower quadrant  abdominal pain.  X-rays revealed a 5-mm sized right distal ureteral  calculus with obstruction and hydronephrosis.  The patient was started  on Flomax.  She felt weakness and she returned to the emergency room.  She has been admitted to the hospital for further treatment.  She had  persistent pain in the right lower quadrant of the abdomen and she has  not passed the stone.  She also has low-grade fever and chills.  She  denied having any voiding difficulty or gross hematuria at present.   PAST MEDICAL HISTORY:  1. Chronic atrial fibrillation.  2. History of respiratory failure and pneumonia.  3. Hypertension.  4. Hyperlipidemia.  5. Chronic leukocytosis.  6. History of rhabdomyolysis.  7. Hypoparathyroidism.  8. Coronary artery disease status post LAD stent in 1998.  9. Status post hysterectomy.  10.History of recurrent urolithiasis status post left kidney surgery      x2.  11.History of congestive heart failure.  12.History of left hip fracture status post ORIF.   MEDICATIONS:  1. Diltiazem CD 240 mg p.o. daily.  2. Digoxin 0.25 mg p.o. daily.  3. Lasix 40 mg p.o. daily.  4. Potassium 20 mg p.o. b.i.d.  5.  Coumadin 5 mg on Mondays, Wednesday, and Fridays; and 7.5 mg on      Tuesdays, Thursdays, Saturdays, and Sundays.  6. Hydrocodone and acetaminophen p.r.n. for pain.  7. Flomax 0.4 mg p.o. daily for stone passage.   ALLERGIES:  She is allergic to Macrobid.   PHYSICAL EXAMINATION:  ABDOMEN:  Soft.  No palpable flank mass.  Mild  right CVA tenderness is noted.  Right lower quadrant abdominal  tenderness is noted.  Bladder not palpable.  No suprapubic tenderness.   ADMISSION LABS:  Urinalysis, blood large, nitrite positive, leukocyte  esterase moderate, wbcs 20-28, rbcs numerous to count, bacteria few.  Urine culture and sensitivity done on August 13, 2007, revealed more  than 100% colonies for multiple bacterial organisms suggestive of  contamination.  PT 24.1, INR 2.0, BUN 36, creatinine is mildly elevated  at 1.52, WBC 14.7, hemoglobin 14.4, hematocrit 42.8, platelet count is  low at 60.  Blood culture and sensitivity is negative at 24 hours.  Urine culture and sensitivities pending.  A noncontrast CT scan of abdomen and pelvis revealed a 5-mm size right  distal ureteral calculus just above the ureterovesical junction with  mild hydronephrosis and hydroureter.   IMPRESSION:  Right distal ureteral calculus with obstruction, right  hydronephrosis, urinary tract infection.   PLAN:  1. I discussed with the patient regarding management options.  She is      on Coumadin, her INR is 2.0.  I plan to discuss with Dr. Ouida Sills and      discontinue the Coumadin for possible surgical intervention.  2. If the patient is unable to pass the stone and has persistent pain,      we will proceed with cystoscopy, right retrograde pyelogram, right      ureteroscopy, stone extraction and stent placement after the INR      becomes normal.   Thanks for this consultation.      Dennie Maizes, M.D.  Electronically Signed     SK/MEDQ  D:  02/15/2008  T:  02/16/2008  Job:  [id number]   cc:   Kingsley Callander.  Ouida Sills, MD  Fax: [phone]

## 2010-12-02 NOTE — Letter (Signed)
July 29, 2007    Kingsley Callander. Ouida Sills, MD  [address]  [place], [place] [address]   RE:  GAGANDEEP, KOSSMAN  MRN:  [id number]  /  DOB:  February 06, 1927   Dear Channing Mutters:   Ms. Yin returns to the office for continued assessment and  treatment of coronary disease and atrial fibrillation.  Since I last saw  her more than year ago, she has done superbly.  She has not been  hospitalized nor required urgent medical care except for recurrent  episode of gout that is now coming under control.  She has chronic pedal  edema, but this is tolerable.  Vaccinations are up-to-date.   CURRENT MEDICATIONS:  1. Include warfarin as directed with stable and therapeutic      anticoagulation.  2. Furosemide 40 mg daily.  3. Digoxin 0.25 mg daily.  4. Diltiazem 240 mg daily.  5. KCl 20 mEq b.i.d.  6. Colchicine 0.6 mg daily.   She reports generalized weakness and malaise, but no dyspnea, chest  pain, lightheadedness or syncope.   EXAM:  Overweight pleasant woman in no acute distress.  The weight is 179, 7 pounds less than last year.  Blood pressure 135/75,  heart rate 60 and irregular, respirations 16.  NECK:  Mild jugular venous distention; normal carotid upstrokes without  bruits.  LUNGS:  Clear.  CARDIAC:  Irregular rhythm; distant first and second heart sounds.  ABDOMEN:  Soft and nontender; no organomegaly.  EXTREMITIES:  1+ pitting ankle and pretibial edema; distal pulses  intact.  NEUROLOGIC:  Symmetric strength and tone; normal cranial nerves.  PSYCHIATRIC:  Alert and oriented; normal affect.   No recent laboratories are available to me.  EKG shows atrial  fibrillation with a ventricular rate of 60.  There is intermittent right  bundle branch block.  Left posterior fascicular block is present as well  as nonspecific ST-segment abnormalities.  Comparison with prior tracing  of September 02, 2000:  PVCs not currently noted; intermittent right  bundle branch block now present.   IMPRESSION:   Ms. Dattilo is doing well overall.  Due to symptoms of  weakness, we will check a chemistry profile, CBC and TSH.  Due to  chronic anticoagulation, stool for hemoccult testing will be obtained.  If results are good, I will plan to see this nice woman again in 1 year.  She will be followed in anticoagulation clinic in the interim.    Sincerely,      Gerrit Friends. Dietrich Pates, MD, [facility]  Electronically Signed    RMR/MedQ  DD: 07/29/2007  DT: 07/29/2007  Job #: [id number]

## 2010-12-02 NOTE — Letter (Signed)
July 30, 2008    Kingsley Callander. Ouida Sills, MD  [address]  [place], [place] [address]   RE:  JAYSIE, BENTHALL  MRN:  [id number]  /  DOB:  1927/05/29   Dear Channing Mutters:   Ms. Cashwell returns to the office as scheduled for continuous  assessment and treatment of coronary artery disease and cardiovascular  risk factors as well as chronic atrial fibrillation.  She notes no  cardiopulmonary symptoms.  She is relatively immobile due to chronic  back pain.  She experiences no palpitations.  She has had no  lightheadedness and certainly no syncope.  Hypertension has been well  controlled.  She has a history of dyslipidemia, but has been intolerant  to multiple medications including developing rhabdomyolysis to Baycol.  She has done well these past 12 months from a cardiac standpoint.  She  was hospitalized once for nephrolithiasis and has had radiographic  studies for her back pain revealing DJD.   CURRENT MEDICATIONS:  Include  1. Warfarin as directed with stable and therapeutic anticoagulation.  2. Furosemide 40 mg daily.  3. Digoxin 0.125 mg daily.  4. Diltiazem 240 mg daily.  5. KCl 20 mEq b.i.d.   PHYSICAL EXAMINATION:  GENERAL:  A very pleasant woman, in no acute  distress.  VITAL SIGNS:  The weight is 173, 6 pounds less than last year.  Blood  pressure 130/65, heart rate 68 and irregular, and respirations 14.  NECK:  No jugular venous distention; normal carotid upstrokes without  bruits.  LUNGS:  Clear.  CARDIAC:  Irregular rhythm; normal first and second heart sounds; modest  systolic murmur.  ABDOMEN:  Soft and nontender; incisional hernia over the left lateral  abdomen, no masses; no organomegaly.  EXTREMITIES:  Trace edema; distal pulses are intact.   LABORATORY DATA:  Most recent laboratory includes a normal CBC from  August and a normal complete metabolic profile.  Stool for hemoccult  testing was negative last year.  I have no recent lipid profile.   IMPRESSION:  Ms.  Woollard is doing very well following percutaneous  intervention for single-vessel disease in 1998.  Blood pressure control  is good.  Control of heart rate in atrial fibrillation is good.  She is  doing well with chronic anticoagulation.  We will check stool for  hemoccult testing again and a lipid profile.  If results are good, I  will see this nice woman again in 1 year.    Sincerely,      Gerrit Friends. Dietrich Pates, MD, [facility]  Electronically Signed    RMR/MedQ  DD: 07/30/2008  DT: 07/31/2008  Job #: [id number]

## 2010-12-02 NOTE — H&P (Signed)
NAMEELIZ, NIGG              ACCOUNT NO.:  [id number]   MEDICAL RECORD NO.:  [id number]          PATIENT TYPE:  INP   LOCATION:  A316                          FACILITY:  [facility]   PHYSICIAN:  Kingsley Callander. Ouida Sills, MD       DATE OF BIRTH:  05/25/1927   DATE OF ADMISSION:  02/14/2008  DATE OF DISCHARGE:  LH                              HISTORY & PHYSICAL   CHIEF COMPLAINT:  Weakness.   HISTORY OF PRESENT ILLNESS:  This patient is a 77 year old female who  presented to the emergency room after slumping down at home.  She did  not lose consciousness.  She was treated in the ER last Saturday for  right renal colic.  She had an obstructing stone in the right ureter,  she has not been able to pass it.  She is not vomiting or experiencing  gross hematuria.  She has also showed signs of a urinary tract  infection.  She has a low grade fever of 99.8.  She has eaten very  little for 3 days.  She has not been drinking very well either. She has  been unsteady on her feet.   PAST MEDICAL HISTORY:  1. Chronic atrial fibrillation.  2. Respiratory failure/pneumonia.  3. Hypertension.  4. Hyperlipidemia.  5. Chronic leukocytosis.  6. Baycol induced rhabdomyolysis.  7. Hyperparathyroidism.  8. Coronary artery disease status post LAD stent in 1998.  9. Hysterectomy.  10.Kidney stones.  11.Congestive heart failure.  12.History of left hip fracture, status post open reduction and      internal fixation.   MEDICATIONS:  1. Diltiazem CD 240 mg daily.  2. Digoxin 0.25 mg daily.  3. Lasix 40 mg daily.  4. Potassium 20 mEq b.i.d.  5. Coumadin 5 mg on Monday, Wednesday, Friday and 7.5 mg on Tuesday,      Thursday, Saturday, Sunday.  6. Hydrocodone/acetaminophen p.r.n.  7. Flomax 0.4 daily started last Saturday which caused nausea.   ALLERGIES:  MACROBID.   SOCIAL HISTORY:  She does not smoke or drink.   REVIEW OF SYSTEMS:  Noncontributory.   PHYSICAL EXAMINATION:  VITAL SIGNS:  Temperature 99.8,  respirations 24,  pulse 103, blood pressure 158/62, oxygen saturation 92%.  GENERAL:  Weak-appearing elderly female.  HEENT:  No scleral icterus.  Pharynx is moist.  NECK:  Supple with no JVD or thyromegaly.  LUNGS:  Clear.  HEART:  Irregular.  ABDOMEN:  Nontender and no CVA tenderness.  No hepatosplenomegaly.  EXTREMITIES:  No cyanosis, clubbing or edema.  NEURO:  Somewhat weak but grossly intact.   LABORATORY DATA:  Urinalysis reveals 21-50 white cells, 21-50 red cells  and many bacteria, the nitrite is positive and leukocyte esterase is  moderate.  White count 14.7 with 82 segs and 4 lymphs; hemoglobin 14.4,  platelets 60,000.  Sodium 132, potassium 4.1, glucose 142, BUN 35,  creatinine 1.5, calcium 8.5, SGOT 43, SGPT 36, alkaline phosphatase 84.  INR 2.   IMPRESSION:  1. Urinary tract infection.  She has a low grade fever and a      leukocytosis.  Urine culture  is being obtained.  She will be      treated with Rocephin IV.  2. Right hydronephrosis by CT scan with a 5 mm calculus near the      ureterovesical junction, will consult urology.  3. Thrombocytopenia.  4. Chronic leukocytosis.  5. Splenomegaly by CT.  6. Chronic atrial fibrillation.  Continue Coumadin.  Her digoxin level      is 2.2.  Will reduce her dose of digoxin to 0.125 mg daily.      Continue diltiazem.  7. Hypertension.  8. Coronary artery disease.  9. Code status full.      Kingsley Callander. Ouida Sills, MD  Electronically Signed     ROF/MEDQ  D:  02/15/2008  T:  02/15/2008  Job:  [id number]

## 2010-12-05 NOTE — H&P (Signed)
[name], [name]                        ACCOUNT NO.:  [id number]   MEDICAL RECORD NO.:  [id number]                   PATIENT TYPE:  OBV   LOCATION:  A217                                 FACILITY:  [facility]   PHYSICIAN:  Kingsley Callander. Ouida Sills, M.D.                  DATE OF BIRTH:  1927-03-15   DATE OF ADMISSION:  01/17/2004  DATE OF DISCHARGE:                                HISTORY & PHYSICAL   CHIEF COMPLAINT:  Aching in the chest.   HISTORY OF PRESENT ILLNESS:  This patient is a 77 year old white female with  a history of coronary artery disease who had been well until one day prior  to admission when she began experiencing a scratchy throat and cough.  She  states she then began developing aching across her anterior chest and  shoulders.  She had experienced some belching after eating cantaloupe, but  this resolved.  She states she felt very hot, but did not become  diaphoretic.  She has not vomited.  She is unsure as to whether she has had  fever.  She has not experienced chills.  She denies purulent sputum  production, but she has a history of pneumonia with respiratory failure.  She is status post an LAD stent in 1998.  She has chronic atrial  fibrillation.  She was initially evaluated in the office where she was found  to have 1 mm ST segment depression in the anterior leads.  She does not  smoke.   PAST MEDICAL HISTORY:  1. Pneumonia and respiratory failure.  2. Chronic atrial fibrillation.  3. Coronary heart disease status post LAD stent August 1998.  4. Hypertension.  5. Hyperparathyroidism status post parathyroidectomy.  6. Status post hysterectomy.  7. History of nephrolithiasis with two pyelolithotomies in the past.   MEDICATIONS:  1. Cardizem CD 240 mg daily.  2. Lanoxin 0.25 mg daily.  3. Lasix 40 mg daily.  4. Coumadin 5 mg daily except 2.5 mg on Wednesday.  5. Kay Ciel 20 mEq b.i.d.   ALLERGIES:  MACROBID.   SOCIAL HISTORY:  She does not use tobacco, alcohol or  recreational drugs.   REVIEW OF SYSTEMS:  She has had questionable fever.  She has had no  vomiting, diarrhea, difficulty voiding.   PHYSICAL EXAMINATION:  VITAL SIGNS:  Temperature 97.1, pulse 82,  respirations 20, blood pressure 111/78.  GENERAL APPEARANCE:  Alert and fully oriented.  HEENT:  Eyes, nose and oropharynx unremarkable.  NECK:  No JVD or thyromegaly.  LUNGS:  Clear.  HEART:  Irregularly irregular.  ABDOMEN:  Nontender with no hepatosplenomegaly.  EXTREMITIES:  Normal pulses.  No clubbing or edema.  NEUROLOGICAL:  Grossly intact.  LYMPH NODES:  No cervical or supraclavicular adenopathy.   LABORATORY DATA:  White count 14.1, hemoglobin 15.8, platelets 167, INR 2.1.  Sodium 137, potassium 3.7, BUN 17, creatinine 0.8, glucose 87.  LFT's  normal.  Initial CK 29, troponin I less than 0.01, digoxin level 1.0.  Chest  x-ray revealed some scarring changes but no acute infiltrate.   IMPRESSION:  1. Chest pain.  The quality of her symptoms have been concerning, especially     in light of her abnormal EKG.  She is being hospitalized for serial     cardiac enzymes, cardiac monitoring and a repeat EKG.  Her symptoms may     very well all be related to a developing respiratory infection.  She does     have a mild leukocytosis.  She will be started on oral Ceftin.  2. Coronary heart disease status post left anterior descending, percutaneous     coronary angioplasty and stent.  3. History of respiratory failure in 2002.  4. Chronic atrial fibrillation.  5. Hypertension.  6. Hyperlipidemia.  7. History of rhabdomyolysis with Baycol.  8. History of hyperparathyroidism.     ___________________________________________                                         Kingsley Callander. Ouida Sills, M.D.   ROF/MEDQ  D:  01/18/2004  T:  01/18/2004  Job:  [id number]

## 2010-12-05 NOTE — Procedures (Signed)
   NAMECHERYLN, BALCOM NO.:  [id number]   MEDICAL RECORD NO.:  [id number]                  PATIENT TYPE:  PREC   LOCATION:                                       FACILITY:   PHYSICIAN:  Vida Roller, M.D.                DATE OF BIRTH:  March 30, 1927   DATE OF PROCEDURE:  01/15/2003  DATE OF DISCHARGE:                                    STRESS TEST   ADENOSINE CARDIOLITE:   INDICATION:  Ms. Rigdon is a 77 year old female with past medical history  significant for coronary artery disease status post percutaneous  intervention of the diagonal branch in 1998.  Her last Cardiolite was in  August 2000 which revealed no ischemia, ejection fraction of 47% and mild  hypokinesis in the inferior apical wall of the left ventricle.  She denies  any angina or ishemic symptoms.  This is a routine stress test.   BASELINE DATA:  EKG shows atrial fibrillation at a rate of 67 beats per  minute with frequent ventricular ectopy.  Blood pressure is 132/70.   Forty six milligrams of adenosine was infused over four minute protocol with  Cardiolite injected at three minutes.  The patient reported a head fullness,  chest fullness, flushing and shortness of breath which resolved in recovery.  EKG showed frequent PVCs, PACs and no ischemic changes.  Maximum heart rate  during adenosine infusion was 98 beats per minute and maximum blood pressure  was 138/70.   Final images and results are pending M.D. review.      Amy Mercy Riding, P.A. [facility]                     Vida Roller, M.D.    AB/MEDQ  D:  01/15/2003  T:  01/15/2003  Job:  [id number]

## 2010-12-05 NOTE — H&P (Signed)
[facility]  Patient:    [name], [name] Visit Number: [id number] MRN: [id number]          Service Type: MED Location: 3A A323 01 Attending Physician:  Annamarie Dawley Dictated by:   Carylon Perches, M.D. Admit Date:  07/24/2001                           History and Physical  CHIEF COMPLAINT:  Cough and shortness of breath.  HISTORY OF PRESENT ILLNESS:  This patient is a 77 year old white female with a history of pneumonia and respiratory failure 1-year ago who presents with cough and shortness of breath.  She had been seen in the office approximately two weeks ago for a bout of bronchitis and treated with oral antibiotic therapy.  She got better, but never completely well.  She started feeling worse yesterday with increased cough, and increased shortness of breath.  She has coughed up a minimal amount of yellow sputum.  She denies pleuritic chest pain.  She is febrile to 100.4.  She was brought to the emergency room where she was found to have chronic changes on her x-ray but no acute infiltrate; however, she was hypoxic with an oxygen saturation of 89%.  PAST MEDICAL HISTORY: 1. Pneumonia and respiratory failure. 2. Chronic atrial fibrillation. 3. Coronary artery disease status post stent placement. 4. Hypertension. 5. Parathyroidectomy. 6. Hysterectomy. 7. Nephrolithiasis, status post 2 pyelolithotomies.  MEDICATIONS: 1. Cardizem CD 240 mg q.d. 2. Metoprolol 25 mg b.i.d. 3. Lanoxin 0.25 mg q.d. 4. Lasix 40 mg q.d. 5. Coumadin 5 mg q.d. 6. Chlorocon 2 q.d. 7. Hycodan p.r.n.  ALLERGIES:  MACROBID.  SOCIAL HISTORY:  She does not smoke cigarettes or smoke alcohol.  REVIEW OF SYSTEMS:  Noncontributory.  PHYSICAL EXAMINATION:  VITAL SIGNS:  100.4, pulse 81, respirations 22, blood pressure 135/71.  GENERAL:  Frequently coughing, mildly dyspneic appearing white female.  HEENT:  No scleral icterus.  Pharynx is unremarkable.  NECK:  Supple  with no JVD.  LUNGS:  Minimal rhonchi.  HEART:  Irregularly irregular.  ABDOMEN:  Nontender.  No hepatosplenomegaly.  EXTREMITIES:  No cyanosis, clubbing or edema.  NEUROLOGIC:  Nonfocal.  X-RAY AND LABORATORY DATA:  Chest x-ray reveals chronic changes in the base, but no acute infiltrate.  Digoxin level 1.7.  Sodium 136, potassium 3.6, BUN 12, creatinine 0.8, SGOT 25.  ABG pH 7.43, pCO2 41, pO2 56.  White count 12.4, hematocrit 44.7, platelets 147.  IMPRESSION: 1. Bronchitis and hypoxia.  She is being hospitalized for more aggressive    therapy with IV Rocephin, Zithromax, Ventolin and Atrovent nebulizer    treatments and supplemental oxygen. 2. Coronary heart disease.  Continue present medical therapy. 3. Chronic atrial fibrillation.  Recheck INR, continue Coumadin. Dictated by:   Carylon Perches, M.D. Attending Physician:  Annamarie Dawley DD:  07/24/01 TD:  07/24/01 Job: [id number]

## 2010-12-05 NOTE — Consult Note (Signed)
NAMEMARLETTE, [name]                        ACCOUNT NO.:  [id number]   MEDICAL RECORD NO.:  [id number]                   PATIENT TYPE:  INP   LOCATION:  5035                                 FACILITY:  [facility]   PHYSICIAN:  Jonelle Sidle, M.D. [facility]        DATE OF BIRTH:  1926-12-25   DATE OF PROCEDURE:  DATE OF DISCHARGE:                      STAT - MUST CHANGE TO CORRECT WORK TYPE   CARDIOLOGY CONSULTATION:   REFERRING PHYSICIAN:  Bradley Ferris, M.D.   REASON FOR CONSULTATION:  Preoperative evaluation.   HISTORY OF PRESENT ILLNESS:  This patient is a pleasant, 77 year old woman  with a known history of coronary artery disease, status post stent placement  to an 80% left anterior descending stenosis in August 1998 (no myocardial  infarction/acute syndrome at that time), chronic atrial fibrillation on  Coumadin; and osteoarthritis, status post arthroscopic knee surgery.  She  was shopping with her daughter at [employer] this morning and tripped falling to  the floor with a resultant hip fracture.  She is being considered for a  plate and screw fixation of the left hip.  She is followed closely by Dr.  Valera Castle in [place] and was last seen a few months ago.  She reports  that she has been doing quite well without any angina, orthopnea, PND,  palpitations, or syncope.  Her functional capacity has been limited somewhat  by her arthritic knee discomfort, but she states that she does house chores  and ambulates without any difficulty.  She is able to climb a flight of  stairs without stopping and without any symptoms.  She reports what  potentially could have been a myocardial perfusion study approximately 1-2  years ago at [facility].  We await these records.  Her home  medications are outlined below.  In terms of other interim difficulty she  has had some problems with recurrent pneumonia, but none recently.  INR  today was actually found to be 1.6.  She states that  this has fluctuated  some over the last few months.   ALLERGIES:  Macrobid.   MEDICATIONS:  1. Digoxin 0.25 mg p.o. daily.  2. Cardizem CD 240 mg p.o. daily.  3. Potassium 20 mEq p.o. daily.  4. Lasix 40 mg p.o. daily.  5. Coumadin 5 mg p.o. daily except 2.5 mg on Tuesday and Thursday.  6. At one point in the past she was on Metoprolol 25 mg p.o. b.i.d. as well.   PAST MEDICAL HISTORY:  1. Coronary artery disease status post stent placement to an 80% proximal     left anterior descending stenosis by Dr. Juanda Chance in August of 1998.  The     patient did not have an acute coronary syndrome at that time and ejection     fraction was normal at 60%.  She has had no symptoms of angina or     congestive heart failure.  She may have had a Cardiolite in the  last few     years at [facility].  We await the records.  2. No type 2 diabetes mellitus or hypertension.  3. Unknown lipid status.  4. Chronic atrial fibrillation over the last several years, documented as     far back as I can tell in 1998.  She has been on chronic Coumadin     therapy.  5. Osteoarthritis.  6. Status post arthroscopic knee surgery.  7. Status post parathyroidectomy.  8. History of nephrolithiasis.  9. History of recurrent pneumonia.   SOCIAL HISTORY:  The patient is married and lives in [place].  She has 2  children.  She is retired from [employer].  She denies any  tobacco or alcohol use.   FAMILY HISTORY:  Noncontributory for premature coronary artery disease.   REVIEW OF SYSTEMS:  As described in the history of present illness.   PHYSICAL EXAMINATION:  VITAL SIGNS:  Heart rate is in the mid-80s and  irregular.  The patient is afebrile.  Blood pressure has ranged from 113/85  to 143/85 in the emergency department.  GENERAL:  In general, this is a well-nourished woman lying supine in no  acute distress.  HEENT:  Conjunctivae are normal.  Oropharynx is clear.  NECK:  Neck is supple  without elevated jugular venous distention or carotid  bruits.  No thyromegaly is noted.  LUNGS:  Lungs are clear to auscultation bilaterally.  CARDIAC:  Cardiac exam reveals an irregularly, irregular rhythm without  significant murmur or S3 gallop.  ABDOMEN:  The abdomen is soft and nontender without hepatosplenomegaly or  bruits.  EXTREMITIES:  Extremities exhibit no clubbing, cyanosis, or edema.  Peripheral pulses are 2+.  SKIN:  No ulcerative changes are noted.  MUSCULOSKELETAL:  No kyphosis noted.  NEUROPSYCHIATRIC:  The patient is alert and oriented x3.  Affect is normal.   X-RAY AND LABORATORY DATA:  WBC 16.9, hemoglobin 15.9, platelets 173,  potassium 2.9, BUN 12 and creatinine 0.8, glucose 130.  Digoxin level 0.9,  INR is 1.6.   Chest x-ray is pending.   12-lead electrocardiogram shows atrial fibrillation at 92 beats per minute  with some aberrantly conducted complexes and ST changes consistent with  Digoxin.  The axis is also rightward although some of the aberrantly  complexes are responsible for this axis shift.   IMPRESSION:  1. Chronic atrial fibrillation on Coumadin therapy.  INR is actually     subtherapeutic at this time.  Would hold Coumadin and proceed with     surgery.  2. Known history of coronary artery disease status post pent placement of     the proximal left anterior descending in 1998. The patient is     asymptomatic, functions to a level of 4 METS by her description, and may     have actually had a Cardiolite within the last 2 years at [facility].  We will try to track down these records.  In either event, can     most likely proceed with planned surgery on medical therapy.  3. Recently left hip fracture status post fall without syncope or loss of     consciousness.  The patient reports that she simply tripped.  4. Hypokalemia.  5. Elevated white blood cell count, possibly due to stress response.  The    patient is afebrile.  Would  continue to monitor this.  A chest x-ray is     pending.   RECOMMENDATIONS:  1. Continue to hold Coumadin and follow up INR in the morning.  2. Will need to restart Coumadin when safe from surgical perspective to     again attain goal of INR between 2.0 and 3.0 based on chronic atrial     fibrillation.  3. In terms of rate control, would continue Digoxin and Cardizem.  She was     previously on metoprolol 25 mg p.o. b.i.d. approximately 1 year ago.     Might consider adding this back particularly if rate control is an issue     in the perioperative setting.  There is some suggestion that she may have     had COPD and perhaps did not tolerate this, long-term, in the past     although I have no firm documentation of this.  4. Will try to obtain testing results from [facility].  5. Repeat potassium.                                               Jonelle Sidle, M.D. [facility]    SGM/MEDQ  D:  09/16/2002  T:  09/16/2002  Job:  [id number]

## 2010-12-05 NOTE — Discharge Summary (Signed)
NAMEMARIELENA, HARVELL                        ACCOUNT NO.:  [id number]   MEDICAL RECORD NO.:  [id number]                   PATIENT TYPE:  INP   LOCATION:  5035                                 FACILITY:  [facility]   PHYSICIAN:  Deidre Ala, M.D.                 DATE OF BIRTH:  [birth year]   DATE OF ADMISSION:  09/16/2002  DATE OF DISCHARGE:  09/21/2002                                 DISCHARGE SUMMARY   ADDITIONAL DIAGNOSIS:  Nondisplaced intertrochanteric fracture of the left  hip.   DISCHARGE DIAGNOSIS:  Nondisplaced intertrochanteric fracture of the left  hip status post open reduction internal fixation with an Omega hip screw.   HISTORY OF PRESENT ILLNESS:  The patient was admitted to [facility]  on 09/16/02 with the diagnosis of a left hip intertrochanteric fracture, two  parts.  Was taken to the operating room on 09/17/02 for an ORIF for  intertrochanteric fracture with an Omega hip screw for whole plate.  This  was done under general anesthesia with 600 cc blood loss.  She was taken to  the recovery room in stable condition.  By 09/18/02, postoperative day #1, she  was having minimal to moderate pain that was well controlled.  Vitals were  stable.  The left hip wound was benign and lower extremity was  neurovascularly intact.  She was placed on Coumadin and Lovenox per  protocol.  Her weightbearing status was toe touch weightbearing on the left  leg for transfers only with physical therapy assistance.  Social services  was consulted for discharge planning as well as PT/OT and rehabilitation.  Cardiology was also consulted as they have been following her along for  multiple cardiac issues.  On 09/18/02, PT evaluated her and they were  recommending rehab or SACU placements.  On postop day #2, 09/19/02, she was  feeling a little bit nauseated, a little bit distended.  Vitals were stable.  Maximum temperature was 100.0.  The wound was benign.  Her INR was 1.5.  H&H  were pending.   The abdomen was mildly distended with good bowel signs.  [facility] was consulted.  Her weightbearing status was changed to 50% on  the left.  The GI doctors were recommending Reglan added for early signs of  an ileus, discontinuing Robaxin, increasing activity, decreasing pain  medications and her diet to clear liquids.  On 09/20/02, postop day #3, she  still had a little bit of nausea and bloated feeling, but he belly was  rumbling.  She said she is having minimal head pain, no burning, numbness or  tingling.  She had denied any flatus.  Her vitals were stable.  T-max 100.0  and down to 98.6 in the morning.  The hip wound is being.  The abdomen was  still distended but nontender and nontender.  The lower extremity was  neurovascularly intact.  H&H were 13.5 and 38.9.  PT 17.9 with INR of 1.6.  Chemistries were normal except for a sodium low at 134, potassium low at  3.1, glucose elevated at 136 and calcium low at 0.1.  Plan was made to send  her to a rehabilitation if appropriate and bed available.  This is what  therapists were recommending, and they were saying that she was stable  orthopedically for transferred once cleared by GI.  GI doctor saw her the  same day, felt she was doing better and cleared her for transfer to rehab.  On 09/21/02, rehab bed became available, so she was transferred.   ACTIVITY:  Upon discharge, her activity was 50% weightbearing on the left  leg, ambulating with walker until cleared by therapy.   DIET:  As tolerated per GI.  HA diet.   WOUND CARE:  Wound instructions were to keep clean and dry.  She could  shower on postop day #5 and have the staples removed on postop day 14 or 15.   FOLLOWUP:  She is to follow up with Dr. Renae Fickle in 1-2 weeks after discharge.   MEDICATIONS:  1. Percocet for pain.  2. Coumadin per protocol.  Regular home medications:  1. Klor-Con M20 20 mg p.o. b.i.d.  2. Diltiazem 240 mg one p.o. daily.  3. Digoxin 0.25 mg one p.o.  daily.  4. Lasix 40 mg one p.o. daily.   DISCHARGE INSTRUCTIONS:  Call for increasing drainage or bleeding,  increasing pain or redness, especially redness tracking proximally, numbness  or tingling in the distal extremity, cough, shortness of breath, fever  greater than 101 or chills.   CONDITION ON DISCHARGE:  Stable.     Madilyn Fireman, P.A.-C.                       Deidre Ala, M.D.    AC/MEDQ  D:  10/12/2002  T:  10/13/2002  Job:  [id number]

## 2010-12-05 NOTE — Procedures (Signed)
[name], [name]                        ACCOUNT NO.:  [id number]   MEDICAL RECORD NO.:  [id number]                   PATIENT TYPE:  OUT   LOCATION:  RAD                                  FACILITY:  [facility]   PHYSICIAN:  Willa Rough, M.D.                  DATE OF BIRTH:  1927/06/20   DATE OF PROCEDURE:  DATE OF DISCHARGE:                                    STRESS TEST   PROCEDURE:  Stress Cardiolite.   HISTORY OF PRESENT ILLNESS:  The patient has known coronary disease.  Cardiolite in the past has shown some apical thinning.  The patient has had  some chest pain and this study is done for further evaluation.   STRESS PROTOCOL:  The patient was stressed with Adenosine infusion.  There  was some chest pain and shortness of breath which was resolved in recovery.  The rhythm was atrial fibrillation.  There were some wide complex beats.   NUCLEAR DATA:  The raw images reveal a suggestion of breast attenuation.  The tomographic images reveal some relative decreased counts of the anterior  wall compatible with breast attenuation.  There is no significant  reversibility.  Wall motion is normal in all segments.  The ejection  fraction is greater than 70%.   IMPRESSION:  With Adenosine infusion, there is no marked EKG change.  The  nuclear images reveal no significant abnormality.  There is no scar or  ischemia.  There is normal wall motion with a high ejection fraction.  There  is some breast attenuation.      ___________________________________________                                            Willa Rough, M.D.   JK/MEDQ  D:  02/21/2004  T:  02/21/2004  Job:  [id number]

## 2010-12-05 NOTE — Discharge Summary (Signed)
[place]  Patient:    [name], [name]                    MRN: [id number] Adm. Date:  07/23/00 Disc. Date: 07/28/00 Dictator:   Earley Favor, R.N., MSN, ACNP CC:         [name], M.D. [facility] - [place], [place]             Carylon Perches, M.D. - [place], [place]                           Discharge Summary  DATE OF BIRTH:  1927-04-13  DISCHARGE DIAGNOSES: 1. Pulmonary edema, secondary to rapid atrial fibrillation. 2. Suspected pneumonia.  HISTORY OF PRESENT ILLNESS:  Ms. Shippee is a 77 year old white female with a history of coronary artery disease, with an angioplasty of the LAD in 1999, with a history of pneumonia, chronic atrial fibrillation, and borderline LV function, who presented with shortness of breath, cough, with yellow sputum, orthopnea, and paroxysmal nocturnal dyspnea.  Her shortness of breath had progressed x 1 week.  She presented initially to [facility] [place] and subsequently was transferred to [facility] and noted to be in atrial fibrillation and was transferred to [facility] from [facility].  She subsequently required orotracheal intubation on arrival for respiratory failure, and required mechanical ventilatory support.  PROCEDURE:  Orotracheal intubation on July 23, 2000, with removal on July 25, 2000.  LABORATORY DATA:  INR 2.2.  Hemoglobin 15.7, hematocrit 47.2, platelets 168, WBC 16.3.  Arterial blood gas on July 23, 2000:  A pH of 7.45, PCO2 of 35, PO2 of 96.  Sodium 137, potassium 3.2, chloride 100, CO2 of 27, BUN 25, creatinine 0.8, glucose 174.  Troponin I of less than 0.02.  CPK and MB were negative.  Digoxin level 0.9.  Arterial blood gases were noted to be on 40% FIO2 with a ventilator at tidal volume 800, rate of assist control of 12.  CPK 142, CPK-MB 3.9.  Blood cultures were negative x 2.  Sputum culture showed  no growth.  RADIOGRAPHIC DATA:  A portable chest x-ray showed probable pulmonary arterial and pulmonary venous hypertension.  Prominence of the right hilum and right infrahilar region.  Peripheral air space opacity in the right lung base, probably representing atelectasis or infiltrate.  Also noting thoracic spondylosis.  A 12-lead electrocardiogram showed atrial fibrillation with rapid ventricular response with a ventricular rate of 145 beats per minute.  HOSPITAL COURSE: #1 - RAPID ATRIAL FIBRILLATION WITH PULMONARY EDEMA:  Ms. Tsou was admitted to [facility] and required orotracheal intubation DD:  07/28/00 TD:  07/28/00 Job: [id number]

## 2010-12-05 NOTE — Discharge Summary (Signed)
[facility]  Patient:    [name], [name] Visit Number: [id number] MRN: [id number]          Service Type: MED Location: 2A A206 01 Attending Physician:  Carylon Perches Dictated by:   Carylon Perches, M.D. Admit Date:  11/12/2001 Discharge Date: 11/15/2001                             Discharge Summary  DISCHARGE DIAGNOSES: 1. Bronchitis. 2. Congestive heart failure. 3. Chronic atrial fibrillation. 4. Hypokalemia. 5. Hyperglycemia.  HOSPITAL COURSE:  This patient is a 77 year old white female who presented to the emergency room with shortness of breath and chest tightness.  Her temperature was 97.8.  She had complained of cough.  There was minimal sputum production.  She has chronic atrial fibrillation and has had several prior bouts of decompensation with similar type clinical pictures.  Her chest x-ray revealed mild changes of COPD but no acute infiltrate.  Her CPK was 37.  Her ABG revealed a pH of 7.48, pCO2 38, pO2 of 61.  Her white count was 18.4, with 89 neutrophils.  Her INR was supratherapeutic at 5.8.  She was hypokalemic at 3.4.  She was hospitalized and treated with Ventolin and Atrovent nebulizer treatments, supplemental oxygen, Rocephin, and Zithromax.  She was continued on Lasix, digoxin, Cardizem and potassium.  Blood cultures were negative.  Her symptoms improved and she was felt to be stable for discharge on November 15, 2001.  Her INR dropped to 2.8 after Coumadin was held.  Her potassium normalized. Her atrial fibrillation was well controlled.  DISCHARGE MEDICATIONS: 1. Omnicef 300 mg b.i.d. for another week. 2. Coumadin 5/2.5 every other day. 3. Kay Ciel 20 mEq b.i.d. 4. Cardizem CD 240 mg q.d. 5. Lanoxin 0.25 mg q.d. 6. Lasix 40 mg q.d. 7. Albuterol MDI 2 puffs q.4h. 8. Zithromax 250 mg for 1 more dose.  FOLLOWUP:  The patient will seen in follow up on Nov 30, 2001, at 9 a.m. Dictated by:   Carylon Perches, M.D. Attending Physician:   Carylon Perches DD:  11/30/01 TD:  12/03/01 Job: [id number]

## 2010-12-05 NOTE — Discharge Summary (Signed)
NAMEDEON, IVEY                        ACCOUNT NO.:  [id number]   MEDICAL RECORD NO.:  [id number]                   PATIENT TYPE:  OBV   LOCATION:  A217                                 FACILITY:  [facility]   PHYSICIAN:  Kingsley Callander. Ouida Sills, M.D.                  DATE OF BIRTH:  06-03-1927   DATE OF ADMISSION:  01/17/2004  DATE OF DISCHARGE:  01/18/2004                                 DISCHARGE SUMMARY   DISCHARGE DIAGNOSES:  1. Chest pain.  2. Upper respiratory infection.  3. Coronary artery disease status post left anterior descending stent.  4. Chronic atrial fibrillation.  5. Hypertension.  6. History of hyperparathyroidism status post parathyroidectomy.  7. History of rhabdomyolysis with Baycol.   HOSPITAL COURSE:  This patient is a 77 year old white female who presented  with upper chest aching for 1 day with an abnormal EKG.  She had experienced  a scratchy throat and cough 1 day prior to admission.  She subsequently  developed aching across her upper chest, shoulders and in the left arm.  An  initial EKG revealed 1 mm ST segment depression in the anterior leads.  She  has chronic atrial fibrillation.  She was well-anticoagulated with Coumadin  with an INR of 2.1.  She was hospitalized for observation.  Serial cardiac  enzymes were normal for three sets.  Her repeat EKG was consistent with  chronic atrial fibrillation and no significant ST segment changes.  She  remained chest pain free and was stable for discharge on January 18, 2004.  Her  URI was treated with Ceftin.  She had an elevated white count of 14.1 with  an elevated monocyte percentage of 17%.  She likely has a viral infection  but with her history of pneumonia and respiratory failure she was treated  with antibiotics empirically.   DISCHARGE MEDICATIONS:  1. Ceftin 250 mg b.i.d. for 6 more days.  2. Cardizem CD 240 mg once daily.  3. Lanoxin 0.25 mg once daily.  4. Lasix 40 mg once daily.  5. KCl 20 mEq b.i.d.  6.  Coumadin 5 mg once daily except 2.5 mg on Wednesday.   She had a low risk Cardiolite scan in June 2004.     ___________________________________________                                         Kingsley Callander. Ouida Sills, M.D.   ROF/MEDQ  D:  01/18/2004  T:  01/18/2004  Job:  [id number]

## 2010-12-05 NOTE — Discharge Summary (Signed)
[facility]  Patient:    [name], [name] Visit Number: [id number] MRN: [id number]          Service Type: MED Location: 2A A210 01 Attending Physician:  Carylon Perches Dictated by:   Carylon Perches, M.D. Admit Date:  07/24/2001 Discharge Date: 07/30/2001   CC:         Valera Castle, M.D.             [place] Bing, M.D.                           Discharge Summary  DISCHARGE DIAGNOSES:  1. Pneumonia. 2. Congestive heart failure. 3. Chronic atrial fibrillation. 4. Coronary artery disease.  HOSPITAL COURSE:  This patient is a 77 year old white female with a history of coronary artery disease who presented with cough and shortness of breath.  Her chest x-ray initially revealed no acute infiltrate.  She was febrile to 100.4. Her pO2 was 56 on room air.  She was hospitalized and started on IV Rocephin, oral Zithromax, Ventolin and Atrovent nebulizer treatments and supplemental oxygen.  She became more congested.  She developed a rapid ventricular response with her atrial fibrillation.  She required transfer to the intensive care unit with increased supplemental oxygen, and IV Lasix for CHF related to her rapid atrial fibrillation.  Metoprolol was stopped due to her wheezing. She was treated with IV Diltiazem.  IV methyl prednisolone was added.  She gradually improved. She was seen in consultation by cardiology.  She was switched back to oral Diltiazem and moved to a monitored bed on 2A.  Her oxygenation and dyspnea gradually improved.  She became over anticoagulated with an INR of 4.9.  Coumadin was held.  Her INR was 3.4 on 1/10.  Her Methyl Prednisolone was switched to oral prednisone.  Her Diltiazem was increased to 360 mg q.d.  Her digoxin level was therapeutic at 1.7 and 1.4.  Her sputum culture revealed normal oropharyngeal flora.  She was improved, to the point, that she was stable for discharge on 1/11.  DISCHARGE MEDICATIONS: 1. Ceftin 500 mg  b.i.d. for 5 more days. 2. Prednisone 10 mg 3 q.d. for 3 days, 2 q.d. for 3 days, 1 q.d. for 3 days. 3. Albuterol metered dose inhaler 2 puffs q.4h. p.r.n. 4. Lanoxin 0.25 mg q.d. 5. Lasix 40 mg 1-1/2 q.d. 6. Coumadin 5 mg 1/2 q.d. with an INR to be checked in 3 days. 7. Cardizem CD 180 mg 2 q.d. 8. K-Dur 20 mEq q.d.  FOLLOWUP:  The patient will be seen in my office in 2 weeks.  Metoprolol has been stopped. Dictated by:   Carylon Perches, M.D. Attending Physician:  Carylon Perches DD:  07/30/01 TD:  08/01/01 Job: [id number]

## 2010-12-05 NOTE — Discharge Summary (Signed)
[name], [name]                        ACCOUNT NO.:  [id number]   MEDICAL RECORD NO.:  [id number]                   [name] TYPE:  IPS   LOCATION:  4142                                 FACILITY:  [facility]   PHYSICIAN:  Erick Colace, M.D.           DATE OF BIRTH:  09-28-1926   DATE OF ADMISSION:  09/21/2002  DATE OF DISCHARGE:  09/28/2002                                 DISCHARGE SUMMARY   DISCHARGE DIAGNOSES:  1. Left hip open reduction-internal fixation for fractures.  2. Chronic atrial fibrillation.  3. Urinary tract infection.  4. Ileus, resolved.   HISTORY OF PRESENT ILLNESS:  [name] is a 77 year old female who  tripped and fell on 09/16/02, sustaining a left hip and intertrochanteric  fracture.  [name] had been on chronic Coumadin prior to admission and  once her INR normalized, she underwent ORIF on 09/17/02 by Dr. Renae Fickle.  She is  touchdown weightbearing and because of partial weightbearing status she has  been unable to maintain limitations.  She did develop an ileus  postoperatively, requiring treatment with IV Reglan as well as potassium  supplementation as she dropped down to 2.9.  Her GI symptoms improved and  [name] was changed to a regular diet.  PT has been ongoing and Jenna  [name] continues total assist for transfers, total assist __________ three  steps.   PAST MEDICAL HISTORY:  Significant for chronic atrial fibrillation,  hypertension, coronary artery disease and is status post PTCA with  thyroidectomy, pneumonia, cholelithiasis, hysterectomy, appendectomy.   ALLERGIES:  MACRODANTIN.   SOCIAL HISTORY:  [name] is married, independent and active prior to  admission.  Lives in a one level home and does not use any tobacco or  alcohol.   HOSPITAL COURSE:  [name] was admitted to rehab on 09/21/02 for  infusion therapy to consist of  __________.  After admission, she was  maintained on partial weightbearing status on Jenna  left lower extremity and  Coumadin was maintained for DVT prophylaxis, as well as chronic atrial  fibrillation.  [name]'s heart rate has shown good control, no cardiac  issues reported.   Her labs done past admission showed a hemoglobin of 13.0, hematocrit 33.7,  white count 18.6, 186,000.  Sodium 138, potassium 3.6, chloride 104, CO2 25,  BUN 11, creatinine 0.7, glucose 109.  A urinalysis was sent and showed Jenna  [name] to have a UTI and she was treated with Cipro for this.  Jenna  [name]'s right hip incision healed well without any signs of infection and  no drainage or erythema were noted.  She did have some recurrent diarrhea  and there was a question of a recurrent ileus.  A KUB was done on 09/27/02,  showing a normal bowel gas pattern.  Jenna diarrhea was attributed more to  laxatives and these were discontinued.  Jenna staples were discontinued and  Jenna area was steri-stripped  on postoperative day 11 without difficulty.   During her stay in rehab, [name] progressed to having supervision  level for upper body care, required minimal assist for lower body care.  She  was supervision for transfers and supervision for 40 to 50 feet with rolling  walker and partial weightbearing status on right and left lower extremity  required minimal assist to navigate stairs.  Further follow-up therapies to  include home health, PT and OT by home health services with home health  nurse for pro time draws.  On 09/28/02, [name] was discharged to home.   DISCHARGE MEDICATIONS:  1. Cipro 250 mg p.o. b.i.d.  2. Ultram 50 mg p.o. q.i.d. p.r.n. pain.  3. Cardizem CD 240 mg per day.  4. Digoxin 0.25 mg a day.  5. Lasix 40 mg a day.   ACTIVITY:  Maintain partial weightbearing on left lower extremity, use  walker.   DIET:  Regular.   Would keep Jenna area clean and dry, especially when washing.  No smoking, no  driving. [facility] to provide home health, PT, OT, as well as iron for proTime   draws.  Next  proTime draws to be called to Dr. [name] at [facility].   FOLLOW UP:  [name] is to follow up with Dr. Renae Fickle for a postop check in  two to three weeks, follow up with Dr. Ouida Sills and Dr. [name] for routine check.  Follow up with Dr. Wynn Banker as needed.     Greg Cutter, P.A.                    Erick Colace, M.D.    PP/MEDQ  D:  10/31/2002  T:  10/31/2002  Job:  [id number]   cc:   Kingsley Callander. Ouida Sills, M.D.  [address]  [place]  [place] [address]  Fax: [phone]   Tama Headings. Marina Goodell, M.D.  [address]  [place]  [place] [address]  Fax: [phone]   Dr. Armstead Peaks C. Wall, M.D. [facility]

## 2010-12-05 NOTE — Op Note (Addendum)
NAMEAKSHARA, BLUMENTHAL                        ACCOUNT NO.:  [id number]   MEDICAL RECORD NO.:  [id number]                   PATIENT TYPE:  AMB   LOCATION:  DSC                                  FACILITY:  [facility]   PHYSICIAN:  Harvie Junior, M.D.                DATE OF BIRTH:  12-05-1926   DATE OF PROCEDURE:  DATE OF DISCHARGE:  03/24/2002                                 OPERATIVE REPORT   PREOPERATIVE DIAGNOSIS:  Medial meniscal tear with degenerative joint  disease.   POSTOPERATIVE DIAGNOSES:  1. Medial meniscal tear.  2. Lateral meniscal tear.  3. Chondromalacia patella.   PRINCIPAL PROCEDURE:  1. Partial medial meniscectomy with debridement of medial femoral     compartment.  2. Lateral meniscectomy with debridement of lateral compartment.  3. Debridement of a femoral trochlear in the patellofemoral joint.   SURGEON:  Dr. Luiz Blare.   ASSISTANT:  Dr. ____ QA MARKER: 44 ____.        ANESTHESIA:  Knee block.   BRIEF HISTORY AND PHYSICAL:  Ms. Daquila is a 77 year old female with a  long history of having significant degenerative disease of the knee.  She  ultimately began having intermittent locking and catching pain.  We have  treated her conservatively with injection therapy x two but with failure of  this ultimately, continued catching and gripping pain.  The patient was  brought to the operating room for operative knee arthroscopy.   PROCEDURE:  The patient was brought to the operating room.  After ____ QA                                                           MARKER: 67____ anesthesia was obtained with the knee block and sedatio  the patient was placed on the operating table.  The right leg was prepped  and draped in a sterile fashion.  Following this, routine arthroscopic  examination of the knee revealed that there was an obvious posterior horn  medial meniscal tear.  This was debrided back to a smooth and stable rim.  There was some grade 3 change on the  medial side, and this was debrided back  to a smooth rim.  Attention was then turned to the anterior cruciate which  was noted to be normal.  Attention was turned to the lateral compartment  where there was a complex lateral meniscal tear.  This was debrided back to  a smooth and stable rim.  There was one area of grade 3 change, and this was  debrided back, and there was a small focus of exposed bone and lateral  compartment.  After smoothing this area down, attention was turned to the  patellofemoral joint where there was  noted to be significant chondromalacia  of the trochlea.  This was debrided with a suction shaver back to a smooth  and stable rim.  The patellofemoral joint was identified and noted to be  with tracking lines was normal after the  trochlea was debrided.  At this point, the knee was copiously irrigated, and  suctioned dry.  The arthroscope portals were closed with a bandage, and a  sterile compressive dressing was applied.  The patient was taken to the  Recovery Room and noted to be in satisfactory condition.                                               Harvie Junior, M.D.    Ranae Plumber  D:  03/24/2002  T:  03/26/2002  Job:  [id number]   cc:   Letta Pate, M.D.  [place], [employer] C. Wall, M.D. [facility]

## 2010-12-05 NOTE — Procedures (Signed)
   NAMEFALESHA, [name]:  [id number]   MEDICAL RECORD NO.:  [id number]                   PATIENT TYPE:  POUT   LOCATION:                                       FACILITY:   PHYSICIAN:  [place] Bing, M.D.               DATE OF BIRTH:   DATE OF PROCEDURE:  01/10/2003  DATE OF DISCHARGE:                                  ECHOCARDIOGRAM   REFERRING PHYSICIANS:  Kingsley Callander. Ouida Sills, M.D. and [name]. Wall, M.D.   CLINICAL INFORMATION:  A 77 year old woman with atrial fibrillation and  hypertension.   M-MODE:  AORTA:  3.3  (<4.0)  LEFT ATRIUM:  4.8  (<4.0)  SEPTUM:  1.0  (0.7-1.1)  POSTERIOR WALL:  1.0  (0.7-1.1)  LV-DIASTOLE:  5.0  (<5.7)  LV-SYSTOLE:  4.1  (<4.0)   FINDINGS/IMPRESSION:  1. A technically adequate echocardiographic study.  2. Mild-to-moderate left atrial enlargement; right atrial size at the upper     limit of normal.  Normal right ventricular size and function.  3. Normal aortic valve.  4. Normal mitral valve; mild annular calcification and mild regurgitation.  5. Normal tricuspid valve; mild regurgitation; mild elevation in estimated     RV systolic pressure.  6. Normal pulmonic valve.  7. Left ventricular size upper normal; no definite segmental wall motion     abnormalities; normal overall LV systolic function.  8. IVC is mildly dilated, diameter decreases normally with inspiration.  9. Comparison with prior study of July 25, 2001: Interval left atrial     enlargement.                                               Arial Bing, M.D.    RR/MEDQ  D:  01/11/2003  T:  01/11/2003  Job:  [id number]

## 2010-12-05 NOTE — H&P (Signed)
[facility]  Patient:    [name], [name] Visit Number: [id number] MRN: [id number]          Service Type: MED Location: 2A A206 01 Attending Physician:  Cassell Smiles. Dictated by:   Kari Baars, M.D. Admit Date:  11/12/2001   CC:         Carylon Perches, M.D.   History and Physical  REASON FOR ADMISSION:  Shortness of breath, possible early pneumonia.  HISTORY OF PRESENT ILLNESS:  The patient is a 77 year old who has had a several-day history of increasing shortness of breath from her usual state. She says that she had been doing pretty well but has developed cough and chest tightness.  She has a history of coronary artery disease in the past.  Initial chest x-ray shows a questionable infiltrate, but we do not have old films to correlate with.  At any rate, she came to the emergency room where she was evaluated and found to be in atrial fibrillation which is her chronic rhythm and the abnormalities in her exam.  She had an elevated white blood cell count, and it was determined that she needs to be admitted to the hospital with all of these problems.  In the past she has had episodes of pneumonia. She was actually hospitalized here in January.  PAST MEDICAL HISTORY: 1. History of chronic atrial fibrillation. 2. History of coronary artery occlusive disease and has had stents placed. 3. Hypertension. 4. History of a parathyroidectomy. 5. Hysterectomy. 6. History of nephrolithiasis.  CURRENT MEDICATIONS: 1. Cardizem CD 240 1 daily. 2. Lasix 40 mg daily. 3. Lanoxin 0.25 mg daily. 4. Potassium chloride 20 mEq daily. 5. Coumadin 5 mg daily.  ALLERGIES:  MACROBID and LEVAQUIN.  SOCIAL HISTORY:  She does not smoke.  She does not drink any alcohol.  FAMILY HISTORY:  Father had COPD and apparently died from a myocardial infarction.  Her mother has had a history of congestive heart failure.  There is a history of diabetes in the family as  well.  REVIEW OF SYSTEMS:  Except as mentioned is negative.  PHYSICAL EXAMINATION:  GENERAL:  Well-developed, well-nourished female who appears to be in moderate respiratory distress.  VITAL SIGNS:  Respiratory rate 20, pulse 100, temperature 97.8, O2 saturation 92% on room air, blood pressure 147/65.  HEENT:  Mucous membranes are slightly dry.  Nose and throat are clear.  Pupils are reactive to light and accommodation.  I cannot see her fundi well. Tympanic membranes are intact.  NECK:  Supple.  Without masses, bruits, or jugular venous distention.  CHEST:  Rhonchi bilaterally.  HEART:  Irregular, with a rate of about 110-120, depending on when it is measured.  ABDOMEN:  Soft, without masses.  EXTREMITIES:  Trace pitting edema.  CNS:  Grossly intact.  LABORATORY DATA:  Prothrombin time 30, INR of 5.8.  Potassium 3.4.  The rest of her electrolytes are normal.  CK 37.  White count 18,400, 65% neutrophils, hemoglobin 16.2.  ASSESSMENT:  She has probably an early pneumonia.  PLAN:  Admit to constant care on telemetry, Dr. Ouida Sills.  Start her on Rocephin and Zithromax and follow. Dictated by:   Kari Baars, M.D. Attending Physician:  Cassell Smiles DD:  11/12/01 TD:  11/13/01 Job: [id number]

## 2010-12-05 NOTE — Discharge Summary (Signed)
NAMECHARLANE, [name]              ACCOUNT NO.:  [id number]   MEDICAL RECORD NO.:  [id number]          PATIENT TYPE:  INP   LOCATION:  A220                          FACILITY:  [facility]   PHYSICIAN:  Kingsley Callander. Ouida Sills, MD       DATE OF BIRTH:  [birth year]   DATE OF ADMISSION:  09/30/2005  DATE OF DISCHARGE:  03/19/2007LH                                 DISCHARGE SUMMARY   DISCHARGE DIAGNOSES:  1.  Rapid atrial fibrillation.  2.  Acute bronchitis.  3.  Hyperglycemia.  4.  Hyperbilirubinemia.  5.  Citrobacter koseri urinary tract infection.  6.  Thrombocytopenia.   HOSPITAL COURSE:  This patient is a 77 year old white female who presented  with cough, shortness of breath and congestion. She was found to be in rapid  atrial fibrillation with a rate in the 140 range. She was hospitalized and  treated with IV diltiazem. She was converted to oral diltiazem. She had  chest congestion and cough. There was no infiltrate on her chest x-ray. Her  white count was 17.6. She was treated with IV Rocephin. Blood cultures were  negative. Her urinalysis was abnormal and the urine culture revealed  Citrobacter koseri which was sensitive to ceftriaxone and quinolones.   She developed a thrombocytopenia to 80,000 but improved to 90,000 prior to  discharge.   Her atrial fibrillation is also treated with Coumadin. Her INR is 2.1 on  discharge.   She was converted from IV to oral diltiazem and maintained rate control  predominately in the 80s. She will be discharged on her usual dose of  Cardizem CD 240 mg daily along with digoxin 0.25 mg daily. Her digoxin level  was 1.2.   Antibiotic therapy was converted to oral Cipro.   DISCHARGE MEDICATIONS:  1.  Cipro 500 mg b.i.d. for another 7 days.  2.  Cardizem CD 240 mg daily.  3.  Digoxin 0.25 mg daily.  4.  Coumadin 5 mg daily.  5.  Lasix 40 mg daily.  6.  Potassium 20 mEq b.i.d.      Kingsley Callander. Ouida Sills, MD  Electronically Signed     ROF/MEDQ  D:   10/05/2005  T:  10/05/2005  Job:  [id number]

## 2010-12-05 NOTE — Discharge Summary (Signed)
[place]. [facility]  Patient:    [name], [name]                     MRN: [id number] Adm. Date:  [id number] Disc. Date: 07/28/00 Attending:  Merwyn Katos Dictator:   Earley Favor, RN, MSN, ACNP CC:         [name]. Wall, M.D. [facility] in [place], [place].             ? [place], [place].   Discharge Summary  DATE OF BIRTH:  [birth year]  DISCHARGE DIAGNOSES: 1. Pulmonary edema, secondary to rapid atrial fibrillation. 2. Suspected pneumonia.  HISTORY OF PRESENT ILLNESS:  [name] is a 77 year old white female with a history of coronary artery disease with an angioplasty of the LAD in 1999 with a history of pneumonia, chronic atrial fibrillation, and borderline LV function who presented with shortness of breath, cough with yellow sputum, orthopnea, and paroxysmal nocturnal dyspnea.  Her shortness of breath had progressed x 1 week.  She presented initially to [facility] in [place], [place]., and subsequently was transferred to [facility] and noted to be in atrial fibrillation, and shortly transferred to [facility] from [facility].  She subsequently required orotracheal intubation on arrival for respiratory failure and required mechanical ventilatory support.  PROCEDURES:  Orotracheal intubation on July 23, 2000, removal July 25, 2000.  LABORATORY DATA:  INR is 2.2, hemoglobin is 15.7, hematocrit 47.2, platelets 168, WBC 16.3.  Arterial blood gases on July 23, 2000, pH 7.45, pCO2 of 35, pO2 of 96.  Sodium 137, potassium 3.2, chloride 100, CO2 27, BUN 25, creatinine is 0.8, glucose 174.  Troponin I is less than 0.02.  CK-MB were negative.  Digoxin levels were 0.9.  Arterial blood gases were noted to be on a 40% FIO2 with a ventilator tidal volume of 800 with a rate on assisted control of 12.  CK 142, CK-MB 3.9, blood cultures were negative x 2.  Sputum culture showed no growth.  Radiographic data:  Portable chest x-ray shows probable pulmonary arterial, and pulmonary venous hypertension, prominence of the left hilum and right infrahilar region.  Peripheral air space opacity right lung base probably representing atelectasis or infiltrate.  Also, noting thoracic spondylosis.  A 12 lead EKG shows atrial fibrillation with rapid ventricular response and a ventricular rate of 145 beats per minute.  HOSPITAL COURSE: #1 - RAPID ATRIAL FIBRILLATION WITH PULMONARY EDEMA:  [name] was admitted to [facility] requiring orotracheal intubation and      mechanical ventilatory support for approximately 48 hours.  She was      gently diuresed to relieve volume overload suspected secondary to her      rapid atrial fibrillation.  Her heart rate was controlled with      pharmaceutical interventions.  Her pulmonary status improved and she was      liberated from the ventilator without great difficulty.  Currently her      pulmonary status is stable.  She is not requiring any interventions i.e.      bronchodilators.  She will be continued on her Coumadin at 5 mg per day      and to follow up with the [place] side of [facility] for      further INR investigations.  #2 - SUSPECTED PNEUMONIA:  There is opacity noted on the chest x-ray.  She was  treated with antimicrobial therapy with Rocephin and Zithromax for      approximately 6 days.  She is afebrile and nontoxic at this point.  #3 - HYPOKALEMIA:  Her potassium is noted to be 3.2, on the day of discharge.      She is currently on potassium supplements 40 mEq q.d.  She is given an      extra supplemental dose of 20 mEq on the day of discharge.  Her      electrolytes will be followed up on an outpatient basis.  MEDICATIONS: 1. Coumadin 3 mg 1 q.d. 2. Lasix 40 mg 1 q.d. 3. K-Dur 40 mEq 1 q.d. 4. Cardizem 240 CD 1 q.d. 5. Digoxin 0.25 mg 1 q.d.  ACTIVITIES:  AS tolerated.  DIET:  Low salt, low fat  diet.  SPECIAL INSTRUCTIONS:  She is to make an appointment to see Dr. [name] in 2 weeks.  She has a follow up appointment with Dr. Darrol Angel is on August 04, 2000, at 3:45 p.m.  DISPOSITION ON DISCHARGE:  Pulmonary status is improved.  She is no longer hypoxemic respiratory failure.  Pulmonary edema has been resolved.  She is being discharged home in improved condition. DD:  07/28/00 TD:  07/28/00 Job: [id number]

## 2010-12-05 NOTE — Group Therapy Note (Signed)
[facility]  Patient:    BAY, JARQUIN Visit Number: [id number] MRN: [id number]          Service Type: MED Location: 2A A206 01 Attending Physician:  Cassell Smiles. Dictated by:   Kari Baars, M.D. Proc. Date: 11/13/01 Admit Date:  11/12/2001   CC:         Carylon Perches, M.D.   Progress Note  PROBLEM:  Bronchitis.  SUBJECTIVE:  Ms. Donalson says she feels a great deal better this morning. She has no new complaints.  OBJECTIVE:  Her physical examination shows that she is afebrile now, blood pressure 120/70, pulse about 80 and irregular.  Her chest is clearer but she still has some rales in the right base.  Her heart is irregular.  LABORATORY DATA:  White blood cell count 12,900, hemoglobin 15.5, hematocrit 45.9, platelets 146.  Her potassium is 3.5 now, sodium 137, CO2 of 29, chloride 103, BUN 17, creatinine 0.8, blood sugar 167.  ASSESSMENT:  She does seem better.  She is certainly clearer than she was, as far as her chest is concerned.  PLAN:  To have her continue current medications, including her antibiotics. Continue potassium replacement, etc.  ADDENDUM:  Her prothrombin time is now 22.6 with an INR of 3.3.  Will hold her Coumadin again today.  No other new changes today. Dictated by:   Kari Baars, M.D. Attending Physician:  Cassell Smiles DD:  11/13/01 TD:  11/14/01 Job: [id number]

## 2010-12-05 NOTE — Op Note (Signed)
[name], [name]                        ACCOUNT NO.:  [id number]   MEDICAL RECORD NO.:  [id number]                   PATIENT TYPE:  INP   LOCATION:  5035                                 FACILITY:  [facility]   PHYSICIAN:  Deidre Ala, M.D.                 DATE OF BIRTH:  04-28-1927   DATE OF PROCEDURE:  09/17/2002  DATE OF DISCHARGE:                                 OPERATIVE REPORT   PREOPERATIVE DIAGNOSIS:  Left hip intertrochanteric fracture, closed, mildly  displaced, two-part.   POSTOPERATIVE DIAGNOSIS:  Left hip intertrochanteric fracture, closed mildly  displaced, two-part.   PROCEDURE:  Open reduction and internal fixation of left hip  intertrochanteric fracture using four-hole 135-degree Omega compression  screw and sideplate.   SURGEON:  Bradley Ferris, M.D.   ASSISTANT:  Madilyn Fireman, P.A.-C.   ANESTHESIA:  General endotracheal.   CULTURES:  None.   DRAINS:  None.   ESTIMATED BLOOD LOSS:  600 cc.   REPLACEMENT:  Without.   PATHOLOGIC FINDINGS AND HISTORY:  This patient twisted her leg yesterday  sustaining this fracture.  She was admitted and placed in traction.  She was  on Coumadin for atrial fibrillation, but her INR initially was 1.6 and it  was 1.3 today.  She was consulted upon by Dr. Simona Huh of [facility].  Dr. Valera Castle is her regular cardiologist, and he deemed her  satisfactory for surgery.  At surgery, no untoward features were noted.  We  obtained satisfactory reduction and placed a 135-degree four-hole Omega  plate with the fourth hole on the plate being unicortical to improve  transition from the stiff metal to the osteoporotic bone.  C-arm fluoroscopy  confirmed position of the lag screw on AP and lateral views as well as  position of the plate and fracture.   DESCRIPTION OF PROCEDURE:  With adequate anesthesia obtained using  endotracheal technique, 1 g Ancef given IV prophylaxis, the patient was  placed in the supine  position on the fracture table.  We then placed the  right leg in a stirrup.  The left leg had abduction and external rotation,  then longitudinal traction, and external rotation maneuver carried out, and  it was placed in traction on the [place] fracture table.  C-arm fluoroscopy  confirmed satisfactory reduction.  After standard prepping and draping of  the left hip, an incision was then made from the greater trochanter downward  along the femoral shaft.  Incision was deepened sharply with a knife, and  hemostasis was obtained using the Bovie electrocoagulator.  Dissection was  carried down through the layers including the iliotibial band, fascia lata,  and vastus lateralis, to bone.  We then placed the guide pin from the  subtrochanteric area into the head with a 135-degree angled guide, and we  passed another one a little more anterior, getting it int he posterior  portion of  the head slightly, and in the effective center of the head on the  AP view.  I then used the step-cut reamer to 110.  I then placed in a short  thread 100-mm lag screw.  Side plate was then affixed.  We then used the  compression mode on the first screw, and then placed additional screws in  place with C-arm fluoroscopy confirming positioning.  I then placed the  capture screw into the barrel, tightened it with the traction off, then took  that screw out with some compression effected.  There was very slight valgus  at the final reduction which I think will be stable.  The fracture fixation  was extremely tight and solid.  We then thoroughly irrigated the wound.  The  wound was closed in layers with #1 PDS running locking on the vastus  lateralis and fascia lata, 0 and 2-0 Vicryl on the subcutaneous tissue, and  skin staples.  A bulky sterile compressive dressing was applied.  The  patient then was awakened, and having tolerated the procedure well, was  taken to the recovery room in satisfactory condition for  routine  postoperative care and cardiology followup.                                               Deidre Ala, M.D.    VEP/MEDQ  D:  09/17/2002  T:  09/17/2002  Job:  [id number]   cc:   Thomas C. [name], M.D. [facility]   Jonelle Sidle, M.D. [facility]

## 2010-12-05 NOTE — Letter (Signed)
June 07, 2006    Kingsley Callander. Ouida Sills, MD  [address]  [place], [place] [address]   RE:  Jenna, [name]  MRN:  [id number]  /  DOB:  1927-02-06   Dear [name]:   [name] returns to the office for continued assessment and treatment  of coronary disease and chronic atrial fibrillation. As you know, her care  was previously managed by Dr. Dorethea Clan. She has done well with no further  manifestations of coronary disease since an angioplasty in 1998 for single-  vessel disease. She has been intolerant of a number of lipid-lowering agents  and does not wish to try any others. She has not used tobacco products.  Hypertension has been well-controlled.   She notes no palpitations with atrial fibrillation. Rate control was poor  when she was admitted to the hospital in March with acute bronchitis, but  has been good every since. Anticoagulation has been stable and therapeutic  with Warfarin. Her other medications including furosemide 40 mg daily,  digoxin 0.25 mg daily, diltiazem 240 mg daily, Kay Ciel 20 mEq b.i.d.   Recent laboratory was good with an essentially normal CBC. Hemoglobin was  slightly elevated and platelets were slightly depressed. Chemistry profile  was normal. Vaccinations are up to date. She underwent colonoscopy in  approximately 2002.   PHYSICAL EXAMINATION:  GENERAL:  A pleasant, overweight woman in no acute  distress. The weight is 186, 3 pounds more than last year.  VITAL SIGNS:  Blood pressure 135/80, heart rate 76 and irregular,  respirations 16.  NECK:  No jugular venous distention; normal carotid upstrokes without  bruits.  LUNGS:  Clear.  CARDIAC:  Normal first and second heart sounds; grade 1-2/6 holosystolic  apical murmur.  ABDOMEN:  Soft and nontender; no organomegaly.  EXTREMITIES:  1+ ankle edema.   IMPRESSION:  [name] is doing generally well. All that need to be done  is stools for Hemoccult testing. We will continue to manage  anticoagulation  therapy in our clinic and plan a return visit in one year.    Sincerely,      Gerrit Friends. Dietrich Pates, MD, [facility]  Electronically Signed    RMR/MedQ  DD: 06/07/2006  DT: 06/07/2006  Job #: [id number]

## 2010-12-05 NOTE — H&P (Signed)
[name], [name]              ACCOUNT NO.:  [id number]   MEDICAL RECORD NO.:  [id number]          PATIENT TYPE:  INP   LOCATION:  A220                          FACILITY:  [facility]   PHYSICIAN:  Kingsley Callander. Ouida Sills, MD       DATE OF BIRTH:  1926/12/25   DATE OF ADMISSION:  09/30/2005  DATE OF DISCHARGE:  LH                                HISTORY & PHYSICAL   CHIEF COMPLAINT:  Cough and difficulty breathing.   HISTORY OF PRESENT ILLNESS:  This patient is a 77 year old white female who  presented to the office complaining of cough and shortness of breath. She  had been evaluated in the emergency room at approximately 4:30 a.m. today.  She had been discharged on Zithromax. She had experienced a one-day history  of coughing with minimal sputum production. She felt increased shortness of  breath. She denied fever. She has a past history of pneumonia and heart  failure; in fact, had a bout of respiratory failure in 2002. She has  experienced congestive heart failure due to rapid atrial fibrillation. On  presentation to the office, she appeared dyspneic. She was found to have a  heart rate in the 140 range. She had previously had an EKG revealing atrial  fibrillation in the 90s. She was mildly hypokalemic at 3.4. She has not  experienced orthopnea or PND recently.   PAST MEDICAL HISTORY:  1.  Chronic atrial fibrillation.  2.  Pneumonia.  3.  Respiratory failure.  4.  Hypertension.  5.  Hyperlipidemia.  6.  Hyperparathyroidism.  7.  History of rhabdomyolysis with Baycol.  8.  Coronary artery disease status post LAD stent in 1998.  9.  Status post hysterectomy.  10. History of kidney stones.   MEDICATIONS:  1.  Coumadin 5 mg daily.  2.  Diltiazem ER 240 mg daily.  3.  Digoxin 0.25 mg daily.  4.  Lasix 40 mg daily.  5.  Potassium 20 mEq b.i.d.   ALLERGIES:  MACROBID.   SOCIAL HISTORY:  She does not smoke, drink or use drugs.   FAMILY HISTORY:  Noncontributory.   PHYSICAL  EXAMINATION:  VITAL SIGNS:  Pulse 104, temperature 98.6,  respirations 24, blood pressure 140/80.  GENERAL:  Dyspneic appearing but alert and oriented.  HEENT:  No scleral icterus. TMs and pharynx normal.  NECK:  No JVD, thyromegaly or lymphadenopathy.  LUNGS:  Clear. She coughs frequently, though.  HEART:  Irregularly irregular and tachycardic.  ABDOMEN:  Soft, nontender. No hepatosplenomegaly.  EXTREMITIES:  No clubbing, cyanosis, or edema.  NEUROLOGICAL:  Grossly intact.  LYMPH NODES:  No cervical or supraclavicular adenopathy.   LABORATORY DATA:  White count 14.4, hemoglobin 15.9, platelets 120,000, 71  segs, 8 lymphs, 18 monos. Sodium 139, potassium 3.4, glucose 148, BUN 13,  creatinine 0.8, calcium 8.5. Troponin I less than 0.05. Urinalysis revealed  positive nitrite, many squamous epithelial cells and many bacteria. The  chest x-ray revealed bronchitic changes with bibasilar atelectasis.   IMPRESSION:  1.  Rapid atrial fibrillation. She will be hospitalized for treatment with  IV diltiazem. Will check a digoxin level and an INR.  2.  Acute bronchitis. Treat with IV Rocephin. Will obtain blood cultures.  3.  History of congestive heart failure and respiratory failure.  4.  History of coronary artery disease.  5.  Hyperglycemia.  6.  Hypokalemia. Will supplement orally.  7.  Thrombocytopenia.  8.  Possible urinary tract infection. Check urine culture.      Kingsley Callander. Ouida Sills, MD  Electronically Signed     ROF/MEDQ  D:  10/01/2005  T:  10/01/2005  Job:  [id number]

## 2010-12-05 NOTE — Discharge Summary (Signed)
[name]  Patient:    [name], [name]                     MRN: [id number] Adm. Date:  [id number] Attending:  Merwyn Katos Dictator:   Earley Favor, RN, MSN, ACNP                           Discharge Summary  NO DICTATION. DD:  07/28/00 TD:  07/28/00 Job: [id number]

## 2010-12-17 ENCOUNTER — Ambulatory Visit (INDEPENDENT_AMBULATORY_CARE_PROVIDER_SITE_OTHER): Payer: Medicare Other | Admitting: *Deleted

## 2010-12-17 DIAGNOSIS — I4891 Unspecified atrial fibrillation: Secondary | ICD-10-CM

## 2010-12-17 DIAGNOSIS — Z7901 Long term (current) use of anticoagulants: Secondary | ICD-10-CM

## 2010-12-17 LAB — POCT INR: INR: 2.5

## 2011-01-14 ENCOUNTER — Ambulatory Visit (INDEPENDENT_AMBULATORY_CARE_PROVIDER_SITE_OTHER): Payer: Medicare Other | Admitting: *Deleted

## 2011-01-14 DIAGNOSIS — I4891 Unspecified atrial fibrillation: Secondary | ICD-10-CM

## 2011-01-14 DIAGNOSIS — Z7901 Long term (current) use of anticoagulants: Secondary | ICD-10-CM

## 2011-01-23 ENCOUNTER — Other Ambulatory Visit: Payer: Self-pay | Admitting: Cardiology

## 2011-02-09 ENCOUNTER — Ambulatory Visit (INDEPENDENT_AMBULATORY_CARE_PROVIDER_SITE_OTHER): Payer: Medicare Other | Admitting: *Deleted

## 2011-02-09 DIAGNOSIS — Z7901 Long term (current) use of anticoagulants: Secondary | ICD-10-CM

## 2011-02-09 DIAGNOSIS — I4891 Unspecified atrial fibrillation: Secondary | ICD-10-CM

## 2011-02-09 LAB — POCT INR: INR: 2.1

## 2011-02-12 ENCOUNTER — Other Ambulatory Visit: Payer: Self-pay | Admitting: Cardiology

## 2011-02-19 ENCOUNTER — Encounter (HOSPITAL_COMMUNITY): Payer: Medicare Other | Attending: Internal Medicine

## 2011-02-19 VITALS — BP 122/64 | HR 87 | Temp 97.7°F

## 2011-02-19 DIAGNOSIS — L0291 Cutaneous abscess, unspecified: Secondary | ICD-10-CM | POA: Insufficient documentation

## 2011-02-19 DIAGNOSIS — L0390 Cellulitis, unspecified: Secondary | ICD-10-CM

## 2011-02-19 MED ORDER — SODIUM CHLORIDE 0.9 % IJ SOLN
INTRAMUSCULAR | Status: AC
  Administered 2011-02-19: 10 mL via INTRAVENOUS
  Filled 2011-02-19: qty 20

## 2011-02-19 MED ORDER — HEPARIN SOD (PORK) LOCK FLUSH 100 UNIT/ML IV SOLN
INTRAVENOUS | Status: AC
  Administered 2011-02-19: 500 [IU] via INTRAVENOUS
  Filled 2011-02-19: qty 5

## 2011-02-19 MED ORDER — SODIUM CHLORIDE 0.9 % IV SOLN
Freq: Once | INTRAVENOUS | Status: AC
  Administered 2011-02-19: 500 mL via INTRAVENOUS

## 2011-02-19 MED ORDER — SODIUM CHLORIDE 0.9 % IJ SOLN
10.0000 mL | Freq: Once | INTRAMUSCULAR | Status: AC
  Administered 2011-02-19: 10 mL via INTRAVENOUS

## 2011-02-19 MED ORDER — VANCOMYCIN HCL IN DEXTROSE 1-5 GM/200ML-% IV SOLN
1000.0000 mg | INTRAVENOUS | Status: DC
  Administered 2011-02-19: 1000 mg via INTRAVENOUS
  Filled 2011-02-19: qty 200

## 2011-02-19 NOTE — Progress Notes (Signed)
Vancomycin for Cellulitis 1gm IV every 24 hours x 5 days.  No trough level needed unless therapy continued longer than initial order. 61.5 inches. 161 pounds.  Results for JAZIYAH, GRADEL (MRN [id number]) as of 02/19/2011 12:38  Ref. Range 01/03/2010 23:24  Sodium Latest Range: 135-145 mEq/L 141  Potassium Latest Range: 3.5-5.1 mEq/L 4.6  Chloride Latest Range: 96-112 mEq/L 101  CO2 Latest Range: 19-32 mEq/L 28  BUN Latest Range: 6-23 mg/dL 19  Creat Latest Range: 0.4-1.2 mg/dL 4.09  Calcium Latest Range: 8.4-10.5 mg/dL 9.3  GFR calc non Af Amer Latest Range: >60 mL/min   GFR calc Af Amer Latest Range: >60 mL/min   Glucose Latest Range: 70-99 mg/dL 92  Alkaline Phosphatase Latest Range: 39-117 U/L 94  Albumin Latest Range: 3.5-5.2 g/dL 4.7  AST Latest Range: 0-37 U/L 21  ALT Latest Range: 0-35 U/L 17  Total Protein Latest Range: 6.0-8.3 g/dL 7.6  Total Bilirubin Latest Range: 0.3-1.2 mg/dL 1.2

## 2011-02-20 ENCOUNTER — Encounter (HOSPITAL_BASED_OUTPATIENT_CLINIC_OR_DEPARTMENT_OTHER): Payer: Medicare Other

## 2011-02-20 DIAGNOSIS — L0390 Cellulitis, unspecified: Secondary | ICD-10-CM

## 2011-02-20 MED ORDER — VANCOMYCIN HCL IN DEXTROSE 1-5 GM/200ML-% IV SOLN
1000.0000 mg | INTRAVENOUS | Status: DC
  Administered 2011-02-20: 1000 mg via INTRAVENOUS
  Filled 2011-02-20: qty 200

## 2011-02-20 NOTE — Progress Notes (Signed)
vancomycin stopped at 1145  Mar will not allow a stop action to be put in.

## 2011-02-21 ENCOUNTER — Encounter (HOSPITAL_COMMUNITY): Payer: Medicare Other

## 2011-02-21 VITALS — BP 139/74 | HR 58 | Temp 97.6°F

## 2011-02-21 DIAGNOSIS — L0390 Cellulitis, unspecified: Secondary | ICD-10-CM

## 2011-02-21 MED ORDER — SODIUM CHLORIDE 0.9 % IJ SOLN
INTRAMUSCULAR | Status: AC
  Filled 2011-02-21: qty 10

## 2011-02-21 MED ORDER — VANCOMYCIN HCL IN DEXTROSE 1-5 GM/200ML-% IV SOLN
1000.0000 mg | Freq: Once | INTRAVENOUS | Status: AC
  Administered 2011-02-21: 1000 mg via INTRAVENOUS

## 2011-02-21 MED ORDER — SODIUM CHLORIDE 0.9 % IV SOLN
Freq: Once | INTRAVENOUS | Status: AC
  Administered 2011-02-21: 10:00:00 via INTRAVENOUS

## 2011-02-21 MED ORDER — HEPARIN SOD (PORK) LOCK FLUSH 100 UNIT/ML IV SOLN
INTRAVENOUS | Status: AC
  Filled 2011-02-21: qty 5

## 2011-02-21 NOTE — Progress Notes (Signed)
Pt tolerated vancomycin 1000mg  infusion without difficulty. Saline locked flushed with 10cc saline and 200 units of heparin.

## 2011-02-22 ENCOUNTER — Encounter (HOSPITAL_COMMUNITY): Payer: Medicare Other

## 2011-02-22 VITALS — BP 128/74 | HR 77 | Temp 97.9°F

## 2011-02-22 DIAGNOSIS — L0390 Cellulitis, unspecified: Secondary | ICD-10-CM

## 2011-02-22 MED ORDER — VANCOMYCIN HCL IN DEXTROSE 1-5 GM/200ML-% IV SOLN
1000.0000 mg | Freq: Once | INTRAVENOUS | Status: AC
  Administered 2011-02-22: 1000 mg via INTRAVENOUS

## 2011-02-22 MED ORDER — SODIUM CHLORIDE 0.9 % IV SOLN
INTRAVENOUS | Status: DC
  Administered 2011-02-22: 10:00:00 via INTRAVENOUS

## 2011-02-22 MED ORDER — SODIUM CHLORIDE 0.9 % IJ SOLN
INTRAMUSCULAR | Status: AC
  Administered 2011-02-22: 10 mL
  Filled 2011-02-22: qty 10

## 2011-02-22 MED ORDER — HEPARIN SOD (PORK) LOCK FLUSH 100 UNIT/ML IV SOLN
INTRAVENOUS | Status: AC
  Administered 2011-02-22: 500 [IU]
  Filled 2011-02-22: qty 5

## 2011-02-22 NOTE — Progress Notes (Signed)
Pt tolerated Vancomycin without problems.

## 2011-02-23 ENCOUNTER — Encounter (HOSPITAL_COMMUNITY): Payer: Medicare Other

## 2011-02-23 ENCOUNTER — Ambulatory Visit (HOSPITAL_COMMUNITY): Payer: Medicare Other

## 2011-02-23 VITALS — BP 113/69 | HR 61 | Temp 98.1°F

## 2011-02-23 DIAGNOSIS — L0390 Cellulitis, unspecified: Secondary | ICD-10-CM

## 2011-02-23 MED ORDER — HEPARIN SOD (PORK) LOCK FLUSH 100 UNIT/ML IV SOLN
INTRAVENOUS | Status: AC
  Filled 2011-02-23: qty 5

## 2011-02-23 MED ORDER — VANCOMYCIN HCL IN DEXTROSE 1-5 GM/200ML-% IV SOLN
1000.0000 mg | Freq: Once | INTRAVENOUS | Status: AC
  Administered 2011-02-23: 1000 mg via INTRAVENOUS
  Filled 2011-02-23: qty 200

## 2011-02-23 MED ORDER — SODIUM CHLORIDE 0.9 % IV SOLN
INTRAVENOUS | Status: DC
  Administered 2011-02-23: 13:00:00 via INTRAVENOUS

## 2011-02-23 MED ORDER — SODIUM CHLORIDE 0.45 % IV SOLN
INTRAVENOUS | Status: DC
  Filled 2011-02-23: qty 1000

## 2011-02-23 MED ORDER — HEPARIN SOD (PORK) LOCK FLUSH 100 UNIT/ML IV SOLN
500.0000 [IU] | Freq: Once | INTRAVENOUS | Status: AC
  Administered 2011-02-23: 200 [IU] via INTRAVENOUS

## 2011-02-23 MED ORDER — SODIUM CHLORIDE 0.9 % IJ SOLN
INTRAMUSCULAR | Status: AC
  Filled 2011-02-23: qty 10

## 2011-02-24 ENCOUNTER — Encounter (HOSPITAL_COMMUNITY): Payer: Medicare Other

## 2011-02-24 MED ORDER — SODIUM CHLORIDE 0.9 % IJ SOLN
INTRAMUSCULAR | Status: AC
  Filled 2011-02-24: qty 10

## 2011-02-24 MED ORDER — SODIUM CHLORIDE 0.9 % IV SOLN
INTRAVENOUS | Status: DC
  Administered 2011-02-24: 10:00:00 via INTRAVENOUS

## 2011-02-24 MED ORDER — HEPARIN SOD (PORK) LOCK FLUSH 100 UNIT/ML IV SOLN
500.0000 [IU] | Freq: Once | INTRAVENOUS | Status: AC
  Administered 2011-02-24: 200 [IU] via INTRAVENOUS

## 2011-02-24 MED ORDER — HEPARIN SOD (PORK) LOCK FLUSH 100 UNIT/ML IV SOLN
INTRAVENOUS | Status: AC
  Filled 2011-02-24: qty 5

## 2011-02-24 MED ORDER — SODIUM CHLORIDE 0.9 % IJ SOLN
10.0000 mL | INTRAMUSCULAR | Status: DC | PRN
  Administered 2011-02-24: 10 mL via INTRAVENOUS

## 2011-02-24 MED ORDER — VANCOMYCIN HCL IN DEXTROSE 1-5 GM/200ML-% IV SOLN
1000.0000 mg | INTRAVENOUS | Status: DC
  Filled 2011-02-24: qty 200

## 2011-02-24 MED ORDER — VANCOMYCIN HCL IN DEXTROSE 1-5 GM/200ML-% IV SOLN
1000.0000 mg | Freq: Once | INTRAVENOUS | Status: AC
  Administered 2011-02-24: 1000 mg via INTRAVENOUS
  Filled 2011-02-24: qty 200

## 2011-02-25 ENCOUNTER — Encounter (HOSPITAL_COMMUNITY): Payer: Medicare Other

## 2011-02-25 VITALS — BP 132/69 | HR 87 | Temp 98.0°F

## 2011-02-25 DIAGNOSIS — M109 Gout, unspecified: Secondary | ICD-10-CM

## 2011-02-25 MED ORDER — HEPARIN SOD (PORK) LOCK FLUSH 100 UNIT/ML IV SOLN
INTRAVENOUS | Status: AC
  Filled 2011-02-25: qty 5

## 2011-02-25 MED ORDER — SODIUM CHLORIDE 0.9 % IJ SOLN
3.0000 mL | INTRAMUSCULAR | Status: DC | PRN
  Administered 2011-02-25: 3 mL via INTRAVENOUS

## 2011-02-25 MED ORDER — SODIUM CHLORIDE 0.9 % IJ SOLN
INTRAMUSCULAR | Status: AC
  Filled 2011-02-25: qty 10

## 2011-02-25 MED ORDER — VANCOMYCIN HCL IN DEXTROSE 1-5 GM/200ML-% IV SOLN
1000.0000 mg | Freq: Once | INTRAVENOUS | Status: AC
  Administered 2011-02-25: 1000 mg via INTRAVENOUS
  Filled 2011-02-25: qty 200

## 2011-02-26 ENCOUNTER — Encounter (HOSPITAL_COMMUNITY): Payer: Medicare Other

## 2011-02-26 VITALS — BP 133/73 | HR 84 | Temp 98.1°F

## 2011-02-26 DIAGNOSIS — L0390 Cellulitis, unspecified: Secondary | ICD-10-CM

## 2011-02-26 MED ORDER — VANCOMYCIN HCL IN DEXTROSE 1-5 GM/200ML-% IV SOLN
1000.0000 mg | Freq: Once | INTRAVENOUS | Status: AC
  Administered 2011-02-26: 1000 mg via INTRAVENOUS
  Filled 2011-02-26: qty 200

## 2011-02-26 MED ORDER — SODIUM CHLORIDE 0.9 % IJ SOLN
10.0000 mL | INTRAMUSCULAR | Status: DC | PRN
  Administered 2011-02-26: 10 mL via INTRAVENOUS

## 2011-02-26 MED ORDER — SODIUM CHLORIDE 0.9 % IJ SOLN
INTRAMUSCULAR | Status: AC
  Administered 2011-02-26: 10 mL via INTRAVENOUS
  Filled 2011-02-26: qty 10

## 2011-02-26 MED ORDER — HEPARIN SOD (PORK) LOCK FLUSH 100 UNIT/ML IV SOLN
500.0000 [IU] | Freq: Once | INTRAVENOUS | Status: AC
  Administered 2011-02-26: 500 [IU] via INTRAVENOUS

## 2011-02-26 MED ORDER — SODIUM CHLORIDE 0.9 % IV SOLN
INTRAVENOUS | Status: DC
  Administered 2011-02-26: 10:00:00 via INTRAVENOUS

## 2011-02-26 MED ORDER — HEPARIN SOD (PORK) LOCK FLUSH 100 UNIT/ML IV SOLN
INTRAVENOUS | Status: AC
  Administered 2011-02-26: 500 [IU] via INTRAVENOUS
  Filled 2011-02-26: qty 5

## 2011-02-27 ENCOUNTER — Encounter (HOSPITAL_COMMUNITY): Payer: Medicare Other

## 2011-02-27 DIAGNOSIS — M109 Gout, unspecified: Secondary | ICD-10-CM

## 2011-02-27 MED ORDER — VANCOMYCIN HCL IN DEXTROSE 1-5 GM/200ML-% IV SOLN
1000.0000 mg | INTRAVENOUS | Status: DC
  Administered 2011-02-27: 1000 mg via INTRAVENOUS
  Filled 2011-02-27: qty 200

## 2011-02-27 MED ORDER — SODIUM CHLORIDE 0.9 % IJ SOLN
INTRAMUSCULAR | Status: AC
  Administered 2011-02-27: 10 mL via INTRAVENOUS
  Filled 2011-02-27: qty 10

## 2011-03-04 ENCOUNTER — Telehealth: Payer: Self-pay

## 2011-03-05 ENCOUNTER — Ambulatory Visit (INDEPENDENT_AMBULATORY_CARE_PROVIDER_SITE_OTHER): Payer: Medicare Other | Admitting: *Deleted

## 2011-03-05 DIAGNOSIS — I4891 Unspecified atrial fibrillation: Secondary | ICD-10-CM

## 2011-03-05 DIAGNOSIS — Z7901 Long term (current) use of anticoagulants: Secondary | ICD-10-CM

## 2011-03-05 LAB — POCT INR: INR: 2.8

## 2011-03-09 ENCOUNTER — Encounter: Payer: Medicare Other | Admitting: *Deleted

## 2011-03-19 ENCOUNTER — Ambulatory Visit (INDEPENDENT_AMBULATORY_CARE_PROVIDER_SITE_OTHER): Payer: Medicare Other | Admitting: *Deleted

## 2011-03-19 DIAGNOSIS — I4891 Unspecified atrial fibrillation: Secondary | ICD-10-CM

## 2011-03-19 DIAGNOSIS — Z7901 Long term (current) use of anticoagulants: Secondary | ICD-10-CM

## 2011-03-19 LAB — POCT INR: INR: 3

## 2011-03-27 ENCOUNTER — Other Ambulatory Visit: Payer: Self-pay | Admitting: Cardiology

## 2011-03-27 ENCOUNTER — Encounter: Payer: Self-pay | Admitting: Physician Assistant

## 2011-03-27 ENCOUNTER — Other Ambulatory Visit: Payer: Self-pay | Admitting: Physician Assistant

## 2011-03-27 ENCOUNTER — Ambulatory Visit (INDEPENDENT_AMBULATORY_CARE_PROVIDER_SITE_OTHER): Payer: Medicare Other | Admitting: Physician Assistant

## 2011-03-27 DIAGNOSIS — R0600 Dyspnea, unspecified: Secondary | ICD-10-CM

## 2011-03-27 DIAGNOSIS — L0390 Cellulitis, unspecified: Secondary | ICD-10-CM | POA: Insufficient documentation

## 2011-03-27 DIAGNOSIS — R609 Edema, unspecified: Secondary | ICD-10-CM

## 2011-03-27 DIAGNOSIS — R0989 Other specified symptoms and signs involving the circulatory and respiratory systems: Secondary | ICD-10-CM

## 2011-03-27 MED ORDER — METOPROLOL SUCCINATE ER 50 MG PO TB24: 50.0000 mg | ORAL_TABLET | Freq: Every day | ORAL | Status: DC

## 2011-03-27 NOTE — Patient Instructions (Signed)
Your physician recommends that you schedule a follow-up appointment in: 1 week with Dr Diona Browner or Herma Carson, PA-C  Your physician has recommended you make the following change in your medication: STOP Diltiazem --- START Metoprolol 50 mg daily --- INCREASE Lasix to 80 mg daily for 3 days and Potassium 20 mEq to 2 tabs twice daily for 3 days then go back to regular dose on both  Your physician recommends that you return for lab work in: today  Your physician has requested that you have a lower extremity venous duplex. This test is an ultrasound of the veins in the legs or arms. It looks at venous blood flow that carries blood from the heart to the legs or arms. Allow one hour for a Lower Venous exam. Allow thirty minutes for an Upper Venous exam. There are no restrictions or special instructions.  Your physician has requested that you have an echocardiogram. Echocardiography is a painless test that uses sound waves to create images of your heart. It provides your doctor with information about the size and shape of your heart and how well your heart's chambers and valves are working. This procedure takes approximately one hour. There are no restrictions for this procedure.

## 2011-03-27 NOTE — Assessment & Plan Note (Signed)
Patient's rate has been controlled on Cardizem and Lanoxin for 3 years. We will stop her Cardizem today in hopes that this will improve her lower extremity edema. I will give her Toprol 50 mg daily.

## 2011-03-27 NOTE — Assessment & Plan Note (Signed)
Patient has bilateral lower extremity edema associated with recent cellulitis. She states the edema has asked to worsen since they gave her the IV vancomycin over the past 10 days. She is on daily diuretics. She is becoming short of breath and has approximately a 10 pound weight gain. Dr. Chauncey Cruel would like her Cardizem stopped which we can do. I suspect she has fluid overload and will increase her Lasix to 80 mg daily for 3 days as well as her potassium. We'll check a 2-D echo as well as lower extremity venous Dopplers. We'll also check a BNP and be met. She has not had a 2-D echo in many years and at that time her ejection fraction was normal.

## 2011-03-27 NOTE — Progress Notes (Signed)
HPI: This is an 77 year old white female patient who is referred to [place] for lower extremity edema by Dr. Ouida Sills. She's been treated for  lower leg cellulitis with IV vancomycin for 10 days.Since then  the patient complains of worsening edema of her lower extremities and 10 pound weight gain. Dr. Ouida Sills wants to know if we can change her Cardizem to Toprol in hopes that this will help. The patient also complains of dyspnea on exertion. She denies chest pain, palpitations, dizziness or presyncope.  Patient has a history of coronary artery disease status post stent placement to the proximal LAD in 1998. Ejection fraction 60%. She also has chronic atrial fibrillation and is on Coumadin.  Allergies  Allergen Reactions  . Iohexol      Desc: PT GOT VERY SOB DURING CARDIAC CATH IN 1998/ STATES THEY HAD TO STOP DOING THE CATH DUE TO THIS/ ALSO PRODUCED SEVERE HEADACHE FOR WEEKS/ PT REFUSING FURTHER CONTRAST/ TSF     Current Outpatient Prescriptions on File Prior to Visit  Medication Sig Dispense Refill  . digoxin (LANOXIN) 0.125 MG tablet TAKE ONE (1) TABLET BY MOUTH EVERY      DAY  15 tablet  0  . furosemide (LASIX) 40 MG tablet Take 40 mg by mouth daily.        . potassium chloride SA (K-DUR,KLOR-CON) 20 MEQ tablet Take 20 mEq by mouth 2 (two) times daily.        Marland Kitchen warfarin (COUMADIN) 5 MG tablet Take 5 mg by mouth daily. As directed by Anticoagulation clinic.         Past Medical History  Diagnosis Date  . Hypertension   . Lower extremity edema     Past Surgical History  Procedure Date  . Thyroidectomy   . Vesicovaginal fistula closure w/ tah   . Appendectomy   . Orif hip fracture 2004    Left hip, following fracture  . Arthroscopic r knee surgery 2003  . Kidney stones     x 2    Family History  Problem Relation Age of Onset  . Heart attack Mother   . Heart attack Father     History   Social History  . Marital Status: Married    Spouse Name: N/A    Number of Children: N/A  .  Years of Education: N/A   Occupational History  . Not on file.   Social History Main Topics  . Smoking status: Never Smoker   . Smokeless tobacco: Not on file  . Alcohol Use: No  . Drug Use: No  . Sexually Active: Not on file   Other Topics Concern  . Not on file   Social History Narrative  . No narrative on file    ROS: See HPI Eyes: Negative Ears:Negative for hearing loss, tinnitus Cardiovascular: Negative for chest pain, palpitations,irregular heartbeat, dyspnea,  near-syncope, orthopnea, paroxysmal nocturnal dyspnia and syncope, claudication, cyanosis,.  Respiratory:   Negative for cough, hemoptysis, shortness of breath, sleep disturbances due to breathing, sputum production and wheezing.   Endocrine: Negative for cold intolerance and heat intolerance.  Hematologic/Lymphatic: Negative for adenopathy and bleeding problem. Does not bruise/bleed easily.  Musculoskeletal: Negative.   Gastrointestinal: Negative for nausea, vomiting, reflux, abdominal pain, diarrhea, constipation.   Neurological: Negative.  Allergic/Immunologic: Negative for environmental allergies.   PHYSICAL EXAM: Well-nournished, in no acute distress. Neck: No JVD, HJR, Bruit, or thyroid enlargement Lungs: No tachypnea, clear without wheezing, rales, or rhonchi Cardiovascular: irregular irregular, PMI not displaced, heart sounds  normal, no murmurs, gallops, bruit, thrill, or heave. Abdomen: BS normal. Soft without organomegaly, masses, lesions or tenderness. Extremities: 2+ edema in bilateral lower extremities with weeping, some erythema. Good distal pulses bilateral SKin: Warm, no lesions or rashes  Musculoskeletal: No deformities Neuro: no focal signs  BP 168/95  Pulse 84  Ht 5' (1.524 m)  Wt 161 lb (73.029 kg)  BMI 31.44 kg/m2  SpO2 92%

## 2011-03-27 NOTE — Assessment & Plan Note (Signed)
Patient denies chest pain

## 2011-03-28 LAB — BASIC METABOLIC PANEL
BUN: 23 mg/dL (ref 6–23)
Calcium: 8.9 mg/dL (ref 8.4–10.5)
Potassium: 3.5 mEq/L (ref 3.5–5.3)
Sodium: 145 mEq/L (ref 135–145)

## 2011-03-28 LAB — BRAIN NATRIURETIC PEPTIDE: Brain Natriuretic Peptide: 90.2 pg/mL (ref 0.0–100.0)

## 2011-03-30 ENCOUNTER — Ambulatory Visit (HOSPITAL_COMMUNITY)
Admission: RE | Admit: 2011-03-30 | Discharge: 2011-03-30 | Disposition: A | Discharge status: Home / Self Care | Payer: Medicare Other | Source: Ambulatory Visit | Attending: Physician Assistant | Admitting: Physician Assistant

## 2011-03-30 ENCOUNTER — Telehealth: Payer: Self-pay | Admitting: Physician Assistant

## 2011-03-30 ENCOUNTER — Ambulatory Visit (HOSPITAL_COMMUNITY)
Admission: RE | Admit: 2011-03-30 | Discharge: 2011-03-30 | Disposition: A | Discharge status: Home / Self Care | Payer: Medicare Other | Source: Ambulatory Visit | Attending: Cardiology | Admitting: Cardiology

## 2011-03-30 DIAGNOSIS — R609 Edema, unspecified: Secondary | ICD-10-CM

## 2011-03-30 DIAGNOSIS — I4891 Unspecified atrial fibrillation: Secondary | ICD-10-CM | POA: Insufficient documentation

## 2011-03-30 DIAGNOSIS — I319 Disease of pericardium, unspecified: Secondary | ICD-10-CM

## 2011-03-30 DIAGNOSIS — I2510 Atherosclerotic heart disease of native coronary artery without angina pectoris: Secondary | ICD-10-CM | POA: Insufficient documentation

## 2011-03-30 DIAGNOSIS — M79609 Pain in unspecified limb: Secondary | ICD-10-CM | POA: Insufficient documentation

## 2011-03-30 DIAGNOSIS — M7989 Other specified soft tissue disorders: Secondary | ICD-10-CM | POA: Insufficient documentation

## 2011-03-30 NOTE — Telephone Encounter (Signed)
DAUGHTER IS CALLING TODAY TO LET MICHELLE LINZE KNOW THAT SHE IS VERY UPSET THAT SHE PUT HER MOTHER ON TOPROL KNOWING THAT SHE WAS TOLD NOT TO TAKE THIS MEDICATION FROM DR Ouida Sills.  PATIENT DID TRY TO TAKE IT AGAIN TOOK TWO DOSES AND CAUSED SOB AND COUGHING.   STATES THAT THE PHARMACY EVEN CALLED TO VERIFY ABOUT DRUG BECAUSE THEY HAVE IT ON RECORD THAT SHE IS NOT TO TAKE IT.  DAUGHTER STATES SHE DOES NOT NEED A CALL BACK UNLESS MICHELLE JUST WANTS TO TALK TO HER.

## 2011-03-30 NOTE — Progress Notes (Signed)
*  PRELIMINARY RESULTS* Echocardiogram 2D Echocardiogram has been performed.  [facility] 03/30/2011, 2:38 PM

## 2011-03-31 NOTE — Telephone Encounter (Signed)
Spoke with dr Ouida Sills, he was fine with the pt starting the toprol. He does not have a record of the pt having a problem with toprol in the past. Explained to the pts dtr we were not aware of problems with the toprol. The pt is not taking anything currently and is not sure what to do next. Will forward for [employer] lawrence review [name]

## 2011-04-01 ENCOUNTER — Telehealth: Payer: Self-pay | Admitting: Physician Assistant

## 2011-04-01 NOTE — Telephone Encounter (Signed)
I called patient to check on her edema. She says it's much better since her Lasix was increased. She didn't tolerate the Toprol b/c it made her SOB. She took a Cardizem this am. She is back on Lasix 40mg  daily. Her echo and BNP look good. She will see Joni Reining on Friday for f/u.

## 2011-04-02 NOTE — Telephone Encounter (Signed)
Marcelino Duster addressed this by phone note on 04/01/11.  She is to continue cardizem until seen by me on the 9/14.  Forward the telephone note to [facility], please, just for [place] purposes.  Joni Reining NP

## 2011-04-03 ENCOUNTER — Ambulatory Visit (INDEPENDENT_AMBULATORY_CARE_PROVIDER_SITE_OTHER): Payer: Medicare Other | Admitting: Adult Health

## 2011-04-03 ENCOUNTER — Encounter: Payer: Self-pay | Admitting: Adult Health

## 2011-04-03 DIAGNOSIS — R0989 Other specified symptoms and signs involving the circulatory and respiratory systems: Secondary | ICD-10-CM

## 2011-04-03 DIAGNOSIS — R609 Edema, unspecified: Secondary | ICD-10-CM

## 2011-04-03 DIAGNOSIS — I4891 Unspecified atrial fibrillation: Secondary | ICD-10-CM

## 2011-04-03 DIAGNOSIS — I2789 Other specified pulmonary heart diseases: Secondary | ICD-10-CM

## 2011-04-03 DIAGNOSIS — I272 Pulmonary hypertension, unspecified: Secondary | ICD-10-CM

## 2011-04-03 DIAGNOSIS — R0600 Dyspnea, unspecified: Secondary | ICD-10-CM

## 2011-04-03 MED ORDER — FUROSEMIDE 40 MG PO TABS: ORAL_TABLET | ORAL | Status: DC

## 2011-04-03 MED ORDER — CEPHALEXIN 500 MG PO CAPS: 500.0000 mg | ORAL_CAPSULE | Freq: Two times a day (BID) | ORAL | Status: AC

## 2011-04-03 NOTE — Assessment & Plan Note (Signed)
She is on appropriate medications for PAH, to include digoxin, CCB.  She appears to be stable at this time, but may need further medical management with additional medications to include epoprostenol IV as she continues to have LEE and shortness of breath on occasion.  I would recommend referral to CHF clinic with Dr. Gala Romney on next evaluation by Dr. Dietrich Pates. In the interim, I will increase her lasix to 40 mg in the am and 20 mg in the pm for another 3 days.  She may need to be considered for right heart cardiac catheterization for better evaluation prior to referral and medication adjustments. She is to see Dr. Dietrich Pates in 2 weeks.

## 2011-04-03 NOTE — Patient Instructions (Signed)
Your physician recommends that you schedule a follow-up appointment on the 29th  Your physician has recommended you make the following change in your medication: INCREASE Lasix and Potassium for 4 days -- (half tablet in the afternoon and potassium also) -- START Keflex 500 mg twice daily for 7 days  Please wear your TED hose  You have been referred to Dr Juanetta Gosling for a pulmonary workup

## 2011-04-03 NOTE — Assessment & Plan Note (Signed)
In the interim with increased diuretic dosing short term, Will also have her start wearing TED hose to help with venous return. She has evidence of mild cellulitis of the Right LE. Will give her a course of Keflex 500 mg BID for one week.

## 2011-04-03 NOTE — Progress Notes (Signed)
HPI:  [name] is a very pleasant 77 y/o patient of Dr. Dietrich Pates and Dr. Ouida Sills. we are seeing on follow-up with history of hypertension, cellulitis of the LE. She was seen last by Herma Carson , PA on last visit where she was complaining of LEE. She has a history of atrial fibrillation, thrombocytopenia, coumadin therapy followed by our office.  She was given extra doses of furosemide for 3 days and extra doses of potassium to alleviate edema. She was already on 40 mg daily. She was also tried on metoprolol for HR control in Afib and cardizem was stopped. She was unable to tolerate BB which caused increased shortness of breath. She was returned to cardizem. She is here with continued complaints of LEE, but it is some better.  Her breathing status is about the same. She has lost only one pound.  In the interim, she had an echocardiogram for evaluation of LV fx. This demonstrated normal LV but high PA pressure of 74 mmHg.    Allergies  Allergen Reactions  . Iohexol      Desc: PT GOT VERY SOB DURING CARDIAC CATH IN 1998/ STATES THEY HAD TO STOP DOING THE CATH DUE TO THIS/ ALSO PRODUCED SEVERE HEADACHE FOR WEEKS/ PT REFUSING FURTHER CONTRAST/ TSF   . Toprol Xl (Metoprolol Succinate)     Cough and extreme SOB    Current Outpatient Prescriptions  Medication Sig Dispense Refill  . digoxin (LANOXIN) 0.125 MG tablet TAKE ONE (1) TABLET BY MOUTH EVERY      DAY  15 tablet  0  . diltiazem (CARDIZEM CD) 240 MG 24 hr capsule Take 1 tablet by mouth daily.      . fish oil-omega-3 fatty acids 1000 MG capsule Take 2 g by mouth daily.        . furosemide (LASIX) 40 MG tablet Take 1 tablet daily in the morning and extra as needed  45 tablet  6  . HYDROcodone-acetaminophen (VICODIN) 5-500 MG per tablet Take 1 tablet by mouth as needed.      . potassium chloride SA (K-DUR,KLOR-CON) 20 MEQ tablet Take 20 mEq by mouth 2 (two) times daily.        Marland Kitchen warfarin (COUMADIN) 5 MG tablet Take 5 mg by mouth daily. As  directed by Anticoagulation clinic.       . cephALEXin (KEFLEX) 500 MG capsule Take 1 capsule (500 mg total) by mouth 2 (two) times daily.  14 capsule  0    Past Medical History  Diagnosis Date  . Hypertension   . Lower extremity edema     Past Surgical History  Procedure Date  . Thyroidectomy   . Vesicovaginal fistula closure w/ tah   . Appendectomy   . Orif hip fracture 2004    Left hip, following fracture  . Arthroscopic r knee surgery 2003  . Kidney stones     x 2    EAV:WUJWJX of systems complete and found to be negative unless listed above PHYSICAL EXAM BP 140/69  Pulse 69  Ht 5' (1.524 m)  Wt 160 lb (72.576 kg)  BMI 31.25 kg/m2  SpO2 94% General: Well developed, well nourished, in no acute distress Head: Eyes PERRLA, No xanthomas.   Normal cephalic and atramatic  Lungs: Clear bilaterally to auscultation and percussion. Heart: HRRR S1 S2,1/6 systolic murmur at the LSB. Pulses are 2+ & equal.            No carotid bruit. No JVD.  No abdominal bruits.  No femoral bruits. Abdomen: Bowel sounds are positive, abdomen soft and non-tender without masses or                  Hernia's noted. Slightly distended. Msk:  Back normal, normal gait. Normal strength and tone for age. Extremities: No clubbing, cyanosis 1+ on the right, 2+ on the left edema.  DP +1 Neuro: Alert and oriented X 3. Psych:  Good affect, responds appropriately    ASSESSMENT AND PLAN

## 2011-04-03 NOTE — Assessment & Plan Note (Signed)
Rate is well controlled on current medications. She will continue in the coumadin clinic. She is to remind Susa Raring that she has been on Keflex.

## 2011-04-16 ENCOUNTER — Encounter: Payer: Medicare Other | Admitting: *Deleted

## 2011-04-17 LAB — DIFFERENTIAL
Basophils Absolute: 0
Basophils Relative: 0
Basophils Relative: 0
Basophils Relative: 0
Eosinophils Relative: 0
Eosinophils Relative: 1
Eosinophils Relative: 3
Eosinophils Relative: 4
Lymphocytes Relative: 4 — ABNORMAL LOW
Lymphs Abs: 1.5
Lymphs Abs: 1.9
Monocytes Absolute: 2.1 — ABNORMAL HIGH
Monocytes Absolute: 3.1 — ABNORMAL HIGH
Monocytes Absolute: 3.9 — ABNORMAL HIGH
Monocytes Absolute: 2.8 — ABNORMAL HIGH
Monocytes Relative: 14 — ABNORMAL HIGH
Monocytes Relative: 21 — ABNORMAL HIGH
Monocytes Relative: 26 — ABNORMAL HIGH
Monocytes Relative: 17 — ABNORMAL HIGH
Neutro Abs: 9.8 — ABNORMAL HIGH
Neutro Abs: 10.9 — ABNORMAL HIGH
Neutrophils Relative %: 82 — ABNORMAL HIGH

## 2011-04-17 LAB — BASIC METABOLIC PANEL
BUN: 29 — ABNORMAL HIGH
CO2: 26
CO2: 28
Chloride: 101
Chloride: 102
Creatinine, Ser: 1.17
GFR calc Af Amer: 51 — ABNORMAL LOW
GFR calc Af Amer: 54 — ABNORMAL LOW
Glucose, Bld: 122 — ABNORMAL HIGH
Potassium: 3.2 — ABNORMAL LOW
Potassium: 3.4 — ABNORMAL LOW
Sodium: 138

## 2011-04-17 LAB — COMPREHENSIVE METABOLIC PANEL
AST: 43 — ABNORMAL HIGH
Albumin: 3.2 — ABNORMAL LOW
Chloride: 101
Creatinine, Ser: 1.54 — ABNORMAL HIGH
GFR calc Af Amer: 39 — ABNORMAL LOW
Potassium: 4.1
Total Bilirubin: 2.4 — ABNORMAL HIGH
Total Protein: 6.6

## 2011-04-17 LAB — URINALYSIS, ROUTINE W REFLEX MICROSCOPIC
Glucose, UA: NEGATIVE
Nitrite: POSITIVE — AB
Nitrite: POSITIVE — AB
Protein, ur: 100 — AB
Specific Gravity, Urine: 1.015
Urobilinogen, UA: 1
pH: 5.5

## 2011-04-17 LAB — CBC
HCT: 37.1
HCT: 38.7
HCT: 37.1
Hemoglobin: 13.2
Hemoglobin: 12.3
MCHC: 33.6
MCHC: 34
MCV: 95.3
MCV: 96
MCV: 95.8
Platelets: 60 — ABNORMAL LOW
Platelets: 60 — ABNORMAL LOW
RBC: 3.9
RBC: 4.05
RBC: 3.87
RDW: 13.4
RDW: 13.4
WBC: 14.7 — ABNORMAL HIGH
WBC: 14.8 — ABNORMAL HIGH
WBC: 16.2 — ABNORMAL HIGH

## 2011-04-17 LAB — URINE MICROSCOPIC-ADD ON

## 2011-04-17 LAB — CULTURE, BLOOD (ROUTINE X 2): Culture: NO GROWTH

## 2011-04-17 LAB — DIGOXIN LEVEL: Digoxin Level: 2.2 — ABNORMAL HIGH

## 2011-04-17 LAB — PROTIME-INR
INR: 2 — ABNORMAL HIGH
INR: 2.6 — ABNORMAL HIGH
INR: 2.9 — ABNORMAL HIGH
Prothrombin Time: 24.1 — ABNORMAL HIGH

## 2011-04-17 LAB — URINE CULTURE: Colony Count: >=100000

## 2011-04-17 LAB — POTASSIUM: Potassium: 4.1

## 2011-04-18 ENCOUNTER — Emergency Department (HOSPITAL_COMMUNITY)
Admission: EM | Admit: 2011-04-18 | Discharge: 2011-04-18 | Disposition: A | Discharge status: Home / Self Care | Payer: Medicare Other | Attending: Emergency Medicine | Admitting: Emergency Medicine

## 2011-04-18 ENCOUNTER — Encounter (HOSPITAL_COMMUNITY): Payer: Self-pay | Admitting: *Deleted

## 2011-04-18 ENCOUNTER — Emergency Department (HOSPITAL_COMMUNITY): Payer: Medicare Other

## 2011-04-18 DIAGNOSIS — T148XXA Other injury of unspecified body region, initial encounter: Secondary | ICD-10-CM

## 2011-04-18 DIAGNOSIS — M7981 Nontraumatic hematoma of soft tissue: Secondary | ICD-10-CM | POA: Insufficient documentation

## 2011-04-18 DIAGNOSIS — R791 Abnormal coagulation profile: Secondary | ICD-10-CM | POA: Insufficient documentation

## 2011-04-18 DIAGNOSIS — Z7901 Long term (current) use of anticoagulants: Secondary | ICD-10-CM | POA: Insufficient documentation

## 2011-04-18 DIAGNOSIS — I10 Essential (primary) hypertension: Secondary | ICD-10-CM | POA: Insufficient documentation

## 2011-04-18 DIAGNOSIS — Z79899 Other long term (current) drug therapy: Secondary | ICD-10-CM | POA: Insufficient documentation

## 2011-04-18 LAB — BASIC METABOLIC PANEL
BUN: 26 mg/dL — ABNORMAL HIGH (ref 6–23)
CO2: 29 mEq/L (ref 19–32)
Calcium: 8.3 mg/dL — ABNORMAL LOW (ref 8.4–10.5)
Glucose, Bld: 129 mg/dL — ABNORMAL HIGH (ref 70–99)
Potassium: 3.2 mEq/L — ABNORMAL LOW (ref 3.5–5.1)
Sodium: 140 mEq/L (ref 135–145)

## 2011-04-18 LAB — PROTIME-INR: INR: 2.99 — ABNORMAL HIGH (ref 0.00–1.49)

## 2011-04-18 LAB — DIFFERENTIAL
Eosinophils Relative: 3 % (ref 0–5)
Lymphocytes Relative: 19 % (ref 12–46)
Lymphs Abs: 1.9 10*3/uL (ref 0.7–4.0)
Monocytes Relative: 23 % — ABNORMAL HIGH (ref 3–12)

## 2011-04-18 LAB — CBC
Hemoglobin: 13.1 g/dL (ref 12.0–15.0)
MCV: 98.2 fL (ref 78.0–100.0)
Platelets: 104 10*3/uL — ABNORMAL LOW (ref 150–400)
RBC: 3.98 MIL/uL (ref 3.87–5.11)
WBC: 9.9 10*3/uL (ref 4.0–10.5)

## 2011-04-18 NOTE — ED Provider Notes (Signed)
History     CSN: [id number] Arrival date & time: 04/18/2011  5:27 PM  Chief Complaint  Patient presents with  . bruising to left side torso     The history is provided by a relative.   77 year old female brought in by a relative with chief complaint of left flank hematoma. Patient apparently bent over to 3 days ago and subsequently developed a spontaneous left flank hematoma. Patient is on Coumadin because of coronary problems. Patient doesn't give a story of direct trauma. She does state that her Coumadin was increased in the last 2 weeks but she has not had her PT INR checked since the change in medication. Patient denies syncope near syncope.  Past Medical History  Diagnosis Date  . Hypertension   . Lower extremity edema   . Tachycardia   . Atrial fibrillation     Past Surgical History  Procedure Date  . Thyroidectomy   . Vesicovaginal fistula closure w/ tah   . Appendectomy   . Orif hip fracture 2004    Left hip, following fracture  . Arthroscopic r knee surgery 2003  . Kidney stones     x 2  . Abdominal hysterectomy   . Cardioversion     Family History  Problem Relation Age of Onset  . Heart attack Mother   . Heart attack Father     History  Substance Use Topics  . Smoking status: Never Smoker   . Smokeless tobacco: Not on file  . Alcohol Use: No    OB History    Grav Para Term Preterm Abortions TAB SAB Ect Mult Living                  Review of Systems  All other systems reviewed and are negative.    Allergies  Iohexol and Toprol xl  Home Medications   Current Outpatient Rx  Name Route Sig Dispense Refill  . DIGOXIN 0.125 MG PO TABS  TAKE ONE (1) TABLET BY MOUTH EVERY      DAY 15 tablet 0    PATIENT MUST CALL OFFICE AND SCHEDULE APPOINTMENT  ...  . DILTIAZEM HCL COATED BEADS 240 MG PO CP24 Oral Take 1 tablet by mouth daily.    . OMEGA-3 FATTY ACIDS 1000 MG PO CAPS Oral Take 2 g by mouth daily.      . FUROSEMIDE 40 MG PO TABS  Take 1 tablet  daily in the morning and extra as needed 45 tablet 6  . HYDROCODONE-ACETAMINOPHEN 5-500 MG PO TABS Oral Take 1 tablet by mouth as needed.    Marland Kitchen POTASSIUM CHLORIDE CRYS CR 20 MEQ PO TBCR Oral Take 20 mEq by mouth 2 (two) times daily.      . WARFARIN SODIUM 5 MG PO TABS Oral Take 5 mg by mouth daily. As directed by Anticoagulation clinic.       BP 138/60  Pulse 95  Temp(Src) 98.3 F (36.8 C) (Oral)  Resp 18  SpO2 95%  Physical Exam  Nursing note and vitals reviewed. Constitutional: She is oriented to person, place, and time. She appears well-developed and well-nourished. No distress.  HENT:  Head: Normocephalic and atraumatic.  Eyes: Pupils are equal, round, and reactive to light.  Neck: Normal range of motion.  Cardiovascular: Normal rate and intact distal pulses.   Pulmonary/Chest: No respiratory distress.  Abdominal: Normal appearance. She exhibits no distension.  Musculoskeletal: Normal range of motion.  Neurological: She is alert and oriented to person, place, and time.  No cranial nerve deficit.  Skin: Skin is warm and dry.       Large left flank hematoma noted left flank and left hemithorax extending into the left abdomen. No active bleeding appreciated.  Psychiatric: She has a normal mood and affect. Her behavior is normal.    ED Course  Procedures (including critical care time)   Labs Reviewed  CBC - Abnormal; Notable for the following:    Platelets 104 (*)    All other components within normal limits  DIFFERENTIAL - Abnormal; Notable for the following:    Monocytes Relative 23 (*)    Monocytes Absolute 2.2 (*)    All other components within normal limits  BASIC METABOLIC PANEL - Abnormal; Notable for the following:    Potassium 3.2 (*)    Glucose, Bld 129 (*)    BUN 26 (*)    Creatinine, Ser 1.34 (*)    Calcium 8.3 (*)    GFR calc non Af Amer 38 (*)    GFR calc Af Amer 46 (*)    All other components within normal limits  PROTIME-INR - Abnormal; Notable for the  following:    Prothrombin Time 31.5 (*)    INR 2.99 (*)    All other components within normal limits     Diagnosis:  #1 left flank hematoma  #2 borderline INR   MDM          Nelia Shi, MD 04/18/11 1916

## 2011-04-18 NOTE — ED Notes (Signed)
Pt states she was leaning over the side of a chair 3 days ago and felt a pop in her side from arm of chair. Pt has bruise to left side torso. Pt states site is tender but not really hurting.

## 2011-04-20 ENCOUNTER — Encounter: Payer: Self-pay | Admitting: Cardiology

## 2011-04-22 ENCOUNTER — Ambulatory Visit (INDEPENDENT_AMBULATORY_CARE_PROVIDER_SITE_OTHER): Payer: Medicare Other | Admitting: Cardiology

## 2011-04-22 ENCOUNTER — Encounter: Payer: Self-pay | Admitting: Cardiology

## 2011-04-22 ENCOUNTER — Encounter (INDEPENDENT_AMBULATORY_CARE_PROVIDER_SITE_OTHER): Payer: Medicare Other | Admitting: *Deleted

## 2011-04-22 DIAGNOSIS — I4891 Unspecified atrial fibrillation: Secondary | ICD-10-CM

## 2011-04-22 DIAGNOSIS — I10 Essential (primary) hypertension: Secondary | ICD-10-CM | POA: Insufficient documentation

## 2011-04-22 DIAGNOSIS — E213 Hyperparathyroidism, unspecified: Secondary | ICD-10-CM | POA: Insufficient documentation

## 2011-04-22 DIAGNOSIS — D696 Thrombocytopenia, unspecified: Secondary | ICD-10-CM

## 2011-04-22 DIAGNOSIS — E785 Hyperlipidemia, unspecified: Secondary | ICD-10-CM | POA: Insufficient documentation

## 2011-04-22 DIAGNOSIS — Z7901 Long term (current) use of anticoagulants: Secondary | ICD-10-CM

## 2011-04-22 DIAGNOSIS — I482 Chronic atrial fibrillation, unspecified: Secondary | ICD-10-CM

## 2011-04-22 DIAGNOSIS — M1990 Unspecified osteoarthritis, unspecified site: Secondary | ICD-10-CM

## 2011-04-22 DIAGNOSIS — I2510 Atherosclerotic heart disease of native coronary artery without angina pectoris: Secondary | ICD-10-CM

## 2011-04-22 MED ORDER — FUROSEMIDE 40 MG PO TABS: ORAL_TABLET | ORAL | Status: DC

## 2011-04-22 MED ORDER — POTASSIUM CHLORIDE CRYS ER 20 MEQ PO TBCR: EXTENDED_RELEASE_TABLET | ORAL | Status: DC

## 2011-04-22 NOTE — Patient Instructions (Signed)
**Note De-Identified  Obfuscation** Your physician recommends that you schedule a follow-up appointment in: 6 months  Your physician recommends that you return for lab work in: in 1 month and in 4 months. We will mail lab orders to you.  Your physician has recommended you make the following change in your medication: increase Potassium to 40 meq. In the morning and 20 meq. In the evenings and you may increase Furosemide (Lasix) to 80 mg daily for increased swelling (edema) or weight gain of 5 pounds until weight back to baseline

## 2011-04-23 NOTE — Assessment & Plan Note (Signed)
Platelets 104-115,000 over the past year.  Thrombocytopenia at this mild level probably did not contribute to her hematoma.  Continued observation is appropriate.

## 2011-04-23 NOTE — Assessment & Plan Note (Signed)
Blood pressure control is excellent; current medication will be continued. 

## 2011-04-23 NOTE — Assessment & Plan Note (Signed)
Heart rate in atrial fibrillation is well-controlled.  Previously concern was raised that diltiazem might be contributing to peripheral edema.  Substitution of metoprolol resulted in severe dyspnea prompting resumption of her previous medication on which she is now doing well.

## 2011-04-23 NOTE — Progress Notes (Signed)
HPI : Ms. Balliet is seen for continuing assessment and treatment of coronary artery disease, permanent atrial fibrillation and cardiovascular risk factors.  She was evaluated in the emergency department 3 days ago after developing a flank hematoma as the result of minor trauma.  Although she is somewhat unsteady on her feet, she has not suffered a recent fall.  She denies palpitations or loss of consciousness.  CBC in the emergency department showed a normal hemoglobin and hematocrit.  Patient has experienced only minor discomfort over her left flank and has noticed some improvement in discoloration of her skin.  INR was therapeutic when she presented to the emergency department.  Chest x-ray was normal.  She was advised to hold warfarin until she was seen here in the office.  Patient has had some peripheral edema but no definite congestive heart failure.  A recent BNP level was 90.  She has not monitored her weights at home.   Weight has been stable over the past 2 years at approximately 160 pounds, but is 5 pounds increased today.  Patient notes no increasing dyspnea and no orthopnea nor PND.  Current Outpatient Prescriptions on File Prior to Visit  Medication Sig Dispense Refill  . digoxin (LANOXIN) 0.125 MG tablet TAKE ONE (1) TABLET BY MOUTH EVERY      DAY  15 tablet  0  . diltiazem (CARDIZEM CD) 240 MG 24 hr capsule Take 1 tablet by mouth daily.      . furosemide (LASIX) 40 MG tablet Take 1 tablet daily in the morning and extra as needed  45 tablet  6  . HYDROcodone-acetaminophen (VICODIN) 5-500 MG per tablet Take 1 tablet by mouth daily.       . potassium chloride SA (K-DUR,KLOR-CON) 20 MEQ tablet Take 40 meq. In am and 20 meq. In pm/dose increase  120 tablet  6  . warfarin (COUMADIN) 5 MG tablet Take 5 mg by mouth daily. As directed by Anticoagulation clinic.          Allergies  Allergen Reactions  . Baycol (Cerivastatin Sodium) Other (See Comments)    Rhabdomyolysis   . Iohexol    Desc: PT GOT VERY SOB DURING CARDIAC CATH IN 1998/ STATES THEY HAD TO STOP DOING THE CATH DUE TO THIS/ ALSO PRODUCED SEVERE HEADACHE FOR WEEKS/ PT REFUSING FURTHER CONTRAST/ TSF   . Toprol Xl (Metoprolol Succinate)     Cough and extreme SOB      Past medical history, social history, and family history reviewed and updated.  ROS: See history of present illness.  PHYSICAL EXAM: BP 144/78  Pulse 93  Resp 18  Ht 5\' 3"  (1.6 m)  Wt 75.025 kg (165 lb 6.4 oz)  BMI 29.30 kg/m2  SpO2 91%  General-Well developed; no acute distress Body habitus-overweight Neck-No JVD; no carotid bruits Lungs-clear lung fields; resonant to percussion; normal I/E ratio Cardiovascular-normal PMI; normal S1 and S2; irregular rhythm Abdomen-normal bowel sounds; soft and non-tender without masses or organomegaly Musculoskeletal-No deformities, no cyanosis or clubbing Neurologic-Normal cranial nerves; symmetric strength and tone Skin-Warm, no significant lesions Extremities-distal pulses intact; 1-2+ edema  ASSESSMENT AND PLAN:

## 2011-04-23 NOTE — Assessment & Plan Note (Signed)
Patient has done well with warfarin therapy until this incident, which still represents a minor complication.  We will resume Coumadin and maintain a therapeutic INR.  Patient was reassured that bruising will resolve over the course of the next few weeks.  We will continue to monitor stool Hemoccults and CBC to exclude occult GI blood loss.

## 2011-04-23 NOTE — Assessment & Plan Note (Addendum)
Patient has had a remarkably stable course since percutaneous intervention for single vessel disease 14 years ago.  Our approach will continue to be optimal control of cardiovascular risk factors in an attempt to prevent recurrent symptomatic coronary disease.

## 2011-04-23 NOTE — Assessment & Plan Note (Signed)
No recent lipid testing is available to me.  A lipid profile will be obtained the next time diagnostic phlebotomy is performed.

## 2011-05-13 ENCOUNTER — Encounter: Payer: Medicare Other | Admitting: *Deleted

## 2011-05-18 ENCOUNTER — Ambulatory Visit (INDEPENDENT_AMBULATORY_CARE_PROVIDER_SITE_OTHER): Payer: Medicare Other | Admitting: *Deleted

## 2011-05-18 DIAGNOSIS — Z7901 Long term (current) use of anticoagulants: Secondary | ICD-10-CM

## 2011-05-18 DIAGNOSIS — I4891 Unspecified atrial fibrillation: Secondary | ICD-10-CM

## 2011-05-27 ENCOUNTER — Encounter (HOSPITAL_COMMUNITY): Payer: Self-pay

## 2011-05-27 ENCOUNTER — Other Ambulatory Visit: Payer: Self-pay

## 2011-05-27 ENCOUNTER — Emergency Department (HOSPITAL_COMMUNITY)
Admission: EM | Admit: 2011-05-27 | Discharge: 2011-05-27 | Disposition: A | Discharge status: Home / Self Care | Payer: Medicare Other | Attending: Emergency Medicine | Admitting: Emergency Medicine

## 2011-05-27 ENCOUNTER — Emergency Department (HOSPITAL_COMMUNITY): Payer: Medicare Other

## 2011-05-27 DIAGNOSIS — Z7901 Long term (current) use of anticoagulants: Secondary | ICD-10-CM | POA: Insufficient documentation

## 2011-05-27 DIAGNOSIS — R0602 Shortness of breath: Secondary | ICD-10-CM | POA: Insufficient documentation

## 2011-05-27 DIAGNOSIS — Z79899 Other long term (current) drug therapy: Secondary | ICD-10-CM | POA: Insufficient documentation

## 2011-05-27 DIAGNOSIS — R040 Epistaxis: Secondary | ICD-10-CM

## 2011-05-27 DIAGNOSIS — T45515A Adverse effect of anticoagulants, initial encounter: Secondary | ICD-10-CM

## 2011-05-27 DIAGNOSIS — I4891 Unspecified atrial fibrillation: Secondary | ICD-10-CM | POA: Insufficient documentation

## 2011-05-27 DIAGNOSIS — E785 Hyperlipidemia, unspecified: Secondary | ICD-10-CM | POA: Insufficient documentation

## 2011-05-27 DIAGNOSIS — I10 Essential (primary) hypertension: Secondary | ICD-10-CM | POA: Insufficient documentation

## 2011-05-27 MED ORDER — PREDNISONE 10 MG PO TABS: 20.0000 mg | ORAL_TABLET | Freq: Every day | ORAL | Status: AC

## 2011-05-27 MED ORDER — PREDNISONE 20 MG PO TABS
40.0000 mg | ORAL_TABLET | Freq: Once | ORAL | Status: AC
  Administered 2011-05-27: 40 mg via ORAL
  Filled 2011-05-27: qty 2

## 2011-05-27 MED ORDER — ALBUTEROL SULFATE (5 MG/ML) 0.5% IN NEBU
2.5000 mg | INHALATION_SOLUTION | Freq: Once | RESPIRATORY_TRACT | Status: AC
  Administered 2011-05-27: 2.5 mg via RESPIRATORY_TRACT
  Filled 2011-05-27: qty 0.5

## 2011-05-27 NOTE — ED Notes (Signed)
Pt reports hx of chf.  Pt reports difficulty breathing since 0200.  Pt states that she was unable to sleep d/t her sob.  Pt also reports having a nosebleed this a.m that she had difficulty getting to stop. Pt does report taking coumadin at home.  Bleeding is controlled now.  Pt has swelling to both lower extremities.  Pt denies any chest pain.

## 2011-05-27 NOTE — ED Provider Notes (Signed)
History   This chart was scribed for [employer]. Colon Branch, MD by Clarita Crane. The patient was seen in room APA14/[facility]14 and the patient's care was started at 7:28AM.   CSN: [id number] Arrival date & time: 05/27/2011  6:56 AM   First MD Initiated Contact with Patient 05/27/11 0725      Chief Complaint  Patient presents with  . Shortness of Breath  . Epistaxis   HPI [name] is a 77 y.o. female who presents to the Emergency Department complaining of constant moderate SOB which awoke her from her sleep while lying flat on bed 5.5 hours ago and has been persistent since with associated wheezing and an episode of epistaxis this morning after awaking which was relieved with application of pressure and a cold compress. States SOB is worse with lying flat and mildly relieved with use of albuterol inhaler and significantly relieved with sitting upright. Denies fever, chills, vomiting, diarrhea, chest pain, abdominal pain. Patient also notes having a persistent cough for 1 week with associated hoarseness which was initially evaluated by Dr. Ouida Sills who prescribed patient a 1 week course of Levaquin but patient notes cough not relieved after finishing full course 2 days ago. Patient also notes having her Coumadin level checked by Dr. Dietrich Pates recently with levels within therapeutic range. Patient with h/o hypertension, hyperlipidemia arteriosclerotic cardiovascular disease, atrial fibrillation.  Past Medical History  Diagnosis Date  . Hypertension   . Arteriosclerotic cardiovascular disease (ASCVD) 1998    80% LAD->PTCA;no significant disease in circumflex or RCA-1998; negative pharmacologic stress nuclear in 8/05  . Chronic atrial fibrillation 1998    DC cardioversion->  Recurrent AF  . Chronic anticoagulation   . Hyperlipidemia   . Hyperparathyroidism     parathyroidectomy  . Nephrolithiasis   . Degenerative joint disease     Chronic low back pain; knee pain  . Gout     Past Surgical History   Procedure Date  . Parathyroidectomy   . Appendectomy   . Orif hip fracture 2004    Left hip, following fracture  . Knee arthroscopy 2003    Left  . Kidney stone surgery     x 2  . Abdominal hysterectomy     Family History  Problem Relation Age of Onset  . Heart attack Mother   . Heart attack Father     History  Substance Use Topics  . Smoking status: Never Smoker   . Smokeless tobacco: Not on file  . Alcohol Use: No    OB History    Grav Para Term Preterm Abortions TAB SAB Ect Mult Living                  Review of Systems 10 Systems reviewed and are negative for acute change except as noted in the HPI.  Allergies  Baycol; Iohexol; and Toprol xl  Home Medications   Current Outpatient Rx  Name Route Sig Dispense Refill  . DIGOXIN 0.125 MG PO TABS  TAKE ONE (1) TABLET BY MOUTH EVERY      DAY 15 tablet 0    PATIENT MUST CALL OFFICE AND SCHEDULE APPOINTMENT  ...  . DILTIAZEM HCL COATED BEADS 240 MG PO CP24 Oral Take 1 tablet by mouth daily.    . FUROSEMIDE 40 MG PO TABS  Take 1 tablet daily in the morning and extra as needed 45 tablet 6  . HYDROCODONE-ACETAMINOPHEN 5-500 MG PO TABS Oral Take 1 tablet by mouth daily.     Marland Kitchen  POTASSIUM CHLORIDE CRYS CR 20 MEQ PO TBCR  Take 40 meq. In am and 20 meq. In pm/dose increase 120 tablet 6  . PROCTOSOL HC 2.5 % RE CREA      . WARFARIN SODIUM 5 MG PO TABS Oral Take 5 mg by mouth daily. As directed by Anticoagulation clinic.       BP 144/68  Pulse 93  Temp(Src) 97.6 F (36.4 C) (Oral)  Resp 18  SpO2 97%  Physical Exam  Nursing note and vitals reviewed. Constitutional: She is oriented to person, place, and time. She appears well-developed and well-nourished. No distress.  HENT:  Head: Normocephalic and atraumatic.  Right Ear: Tympanic membrane normal.  Left Ear: Tympanic membrane normal.  Mouth/Throat: Oropharynx is clear and moist. No oropharyngeal exudate.       Right nare with dry blood. Left nare clear.   Eyes:  EOM are normal.  Neck: Neck supple. No tracheal deviation present.  Cardiovascular: Normal rate and normal heart sounds.  An irregularly irregular rhythm present. Exam reveals no gallop and no friction rub.   No murmur heard. Pulmonary/Chest: Effort normal. No respiratory distress. She has no wheezes.  Abdominal: Soft. Bowel sounds are normal. She exhibits no distension. There is no tenderness.  Musculoskeletal: Normal range of motion. She exhibits edema (brawny, left worse than right).  Neurological: She is alert and oriented to person, place, and time. No sensory deficit.  Skin: Skin is warm and dry.  Psychiatric: She has a normal mood and affect. Her behavior is normal.    ED Course  Procedures (including critical care time)  DIAGNOSTIC STUDIES: Oxygen Saturation is 96% on [place]-2L, normal by my interpretation.    COORDINATION OF CARE: 8:31AM- Patient reports she is "unsure" whether her SOB is improved following administration of first breathing treatment. Patient informed of current lab and imaging results. Patient recommended to use albuterol inhaler 4x daily for the next several days and to follow up with Dr. Ouida Sills within the next several days. Will also prescribe Prednisone to reduce inflammation. Patient agrees with current plan set forth at this time.    Labs Reviewed  PROTIME-INR - Abnormal; Notable for the following:    Prothrombin Time 30.6 (*)    INR 2.88 (*)    All other components within normal limits   Dg Chest Portable 1 View  05/27/2011  *RADIOLOGY REPORT*  Clinical Data: Short of breath.  PORTABLE CHEST - 1 VIEW  Comparison: 04/18/2011  Findings: There likely is a component of mild interstitial edema. No overt airspace edema, pleural fluid or focal infiltrate is identified.  Heart is enlarged and stable in appearance.  IMPRESSION: Probable mild interstitial edema.  Original Report Authenticated By: Reola Calkins, M.D.      Date: 05/27/2011  4782   Rate: 102   Rhythm: atrial fibrillation and premature ventricular contractions (PVC)  QRS Axis: right  Intervals: atrial fibrillation  ST/T Wave abnormalities: nonspecific ST changes  Conduction Disutrbances:none and atrial fibrillation  Narrative Interpretation:   Old EKG Reviewed: unchanged from previous 07/28/10   MDM  Patient with h/o atrial fibrillation and recent treatment for a bronchitis with Levaquin, woke with shortness of breath and a nose bleed. She is on chronic coumadin and had a n INR check on 05/18/11 which was 3.4. Her coumadin had been held for one day. Repeat INR 2.88, therapeutic. No further bleeding. Given albulterol with improvement. Pt feels improved after observation and/or treatment in ED.Pt stable in ED with no significant deterioration  in condition.The patient appears reasonably screened and/or stabilized for discharge and I doubt any other medical condition or other [facility] requiring further screening, evaluation, or treatment in the ED at this time prior to discharge.  Medications Provided in the Emergency Department  Medications  albuterol (PROVENTIL) (5 MG/ML) 0.5% nebulizer solution 2.5 mg (2.5 mg Nebulization Given 05/27/11 0801)  predniSONE (DELTASONE) tablet 40 mg (40 mg Oral Given 05/27/11 0752)     New Prescriptions   No medications on file   I personally performed the services described in this documentation, which was scribed in my presence. The recorded information has been reviewed and considered.    Nicoletta Dress. Colon Branch, MD 05/27/11 [phone]

## 2011-06-08 ENCOUNTER — Ambulatory Visit (INDEPENDENT_AMBULATORY_CARE_PROVIDER_SITE_OTHER): Payer: Medicare Other | Admitting: *Deleted

## 2011-06-08 DIAGNOSIS — Z7901 Long term (current) use of anticoagulants: Secondary | ICD-10-CM

## 2011-06-08 DIAGNOSIS — I4891 Unspecified atrial fibrillation: Secondary | ICD-10-CM

## 2011-06-25 ENCOUNTER — Ambulatory Visit (INDEPENDENT_AMBULATORY_CARE_PROVIDER_SITE_OTHER): Payer: Medicare Other | Admitting: *Deleted

## 2011-06-25 DIAGNOSIS — I482 Chronic atrial fibrillation, unspecified: Secondary | ICD-10-CM

## 2011-06-25 DIAGNOSIS — Z7901 Long term (current) use of anticoagulants: Secondary | ICD-10-CM

## 2011-06-25 DIAGNOSIS — I4891 Unspecified atrial fibrillation: Secondary | ICD-10-CM

## 2011-06-25 LAB — POCT INR: INR: 4.5

## 2011-07-09 ENCOUNTER — Ambulatory Visit (INDEPENDENT_AMBULATORY_CARE_PROVIDER_SITE_OTHER): Payer: Medicare Other | Admitting: *Deleted

## 2011-07-09 DIAGNOSIS — I482 Chronic atrial fibrillation, unspecified: Secondary | ICD-10-CM

## 2011-07-09 DIAGNOSIS — I4891 Unspecified atrial fibrillation: Secondary | ICD-10-CM

## 2011-07-09 DIAGNOSIS — Z7901 Long term (current) use of anticoagulants: Secondary | ICD-10-CM

## 2011-07-23 ENCOUNTER — Ambulatory Visit (HOSPITAL_COMMUNITY)
Admission: RE | Admit: 2011-07-23 | Discharge: 2011-07-23 | Disposition: A | Discharge status: Home / Self Care | Payer: Medicare Other | Source: Ambulatory Visit | Attending: Internal Medicine | Admitting: Internal Medicine

## 2011-07-23 ENCOUNTER — Other Ambulatory Visit (HOSPITAL_COMMUNITY): Payer: Self-pay | Admitting: Internal Medicine

## 2011-07-23 DIAGNOSIS — I10 Essential (primary) hypertension: Secondary | ICD-10-CM | POA: Insufficient documentation

## 2011-07-23 DIAGNOSIS — R0602 Shortness of breath: Secondary | ICD-10-CM

## 2011-07-24 ENCOUNTER — Telehealth: Payer: Self-pay | Admitting: Pulmonary Disease

## 2011-07-24 NOTE — Telephone Encounter (Signed)
I spoke with beth w/ Dr. Concha Norway office and she states Dr. Randel Pigg wants to speak with [facility] regarding this pt. She is scheduled w/ [facility] as a new consult on 07/28/11 at 3:00. Dr. Ouida Sills can be reached at [phone] until 5:00 and after that he can be reached at [phone]. Please advise Dr. Shelle Iron, thanks

## 2011-07-28 NOTE — Telephone Encounter (Signed)
Done.  Spoke with Dr. Ouida Sills.

## 2011-08-06 ENCOUNTER — Ambulatory Visit (INDEPENDENT_AMBULATORY_CARE_PROVIDER_SITE_OTHER): Payer: Medicare Other | Admitting: *Deleted

## 2011-08-06 DIAGNOSIS — I4891 Unspecified atrial fibrillation: Secondary | ICD-10-CM

## 2011-08-06 DIAGNOSIS — I482 Chronic atrial fibrillation, unspecified: Secondary | ICD-10-CM

## 2011-08-06 DIAGNOSIS — Z7901 Long term (current) use of anticoagulants: Secondary | ICD-10-CM

## 2011-08-06 LAB — POCT INR: INR: 4

## 2011-08-07 ENCOUNTER — Encounter: Payer: Self-pay | Admitting: Pulmonary Disease

## 2011-08-07 ENCOUNTER — Ambulatory Visit (INDEPENDENT_AMBULATORY_CARE_PROVIDER_SITE_OTHER): Payer: Medicare Other | Admitting: Pulmonary Disease

## 2011-08-07 VITALS — BP 140/80 | HR 92 | Temp 98.1°F | Ht 60.0 in | Wt 167.0 lb

## 2011-08-07 DIAGNOSIS — R0989 Other specified symptoms and signs involving the circulatory and respiratory systems: Secondary | ICD-10-CM

## 2011-08-07 DIAGNOSIS — J449 Chronic obstructive pulmonary disease, unspecified: Secondary | ICD-10-CM | POA: Insufficient documentation

## 2011-08-07 NOTE — Patient Instructions (Addendum)
Will check oxygen level overnight to see if you need oxygen Will schedule for breathing studies, and see you back same day to discuss. Will start on oxygen with any exertional activities and at rest, but do not need if at rest and feeling ok.

## 2011-08-07 NOTE — Assessment & Plan Note (Signed)
The patient has significant dyspnea on exertion and with lying down that I suspect is multifactorial.  She is very debilitated and deconditioned, and also has significant cardiac disease.  She has been diuresed more aggressively with only a partial response.  She continues to have very significant lower extremity edema, but has been on Coumadin chronically with no DVT on ultrasound study last fall.  It is unclear whether she has a pulmonary issue that is contributing to this.  I would like to check pulmonary function studies to evaluate for obstructive disease, and we'll also check an overnight oximetry to see if she would benefit from nocturnal oxygen.  She did desaturate today with minimal walking distance, and will benefit from oxygen with any kind of activity.  Will arrange for PFTs, and see her back the same day to review.

## 2011-08-07 NOTE — Progress Notes (Signed)
  Subjective:    Patient ID: [name], female    DOB: [birth year], 78 y.o.   MRN: [id number]  HPI The patient is an 78 year old female who I've been asked to see for dyspnea on exertion.  The patient has had long-standing dyspnea, but has noticed worsening shortness of breath over the last 2 months, especially when lying down at night.  The patient states that she can get winded just walking through her house, but she is not short of breath at rest if she is sitting up right.  She has a history of significant cardiac disease, as well as chronic congestive heart failure.  She has been diuresed by her primary care physician with a normal BNP being documented, and although improved, she is not back to her baseline.  She has chronic lower extremity edema that is much worse than 6 months ago, and has had a recent Doppler study that showed no DVT.  The patient already is on chronic Coumadin.  The patient has developed an intermittent dry hacking cough, but admits she has significant postnasal drip.  She has never smoked, and has no history of asthma.  She is very unsteady on her feet, and must use a walker to ambulate.  She has had a chest x-ray which shows a mild interstitial prominence, as well as right basilar scarring.  There were no acute infiltrates.  She had an echocardiogram in September of last year that showed moderate left atrial dilatation, a normal ejection fraction, and a systolic pulmonary artery pressure of 74 mm.  Her right ventricle was not overly abnormal.  Review of Systems  Constitutional: Positive for appetite change. Negative for fever and unexpected weight change.  HENT: Negative for ear pain, nosebleeds, congestion, sore throat, rhinorrhea, sneezing, trouble swallowing, dental problem, postnasal drip and sinus pressure.   Eyes: Negative for redness and itching.  Respiratory: Positive for cough and shortness of breath. Negative for chest tightness and wheezing.   Cardiovascular:  Positive for palpitations. Negative for leg swelling.  Gastrointestinal: Negative for nausea and vomiting.  Genitourinary: Negative for dysuria.  Musculoskeletal: Negative for joint swelling.  Skin: Negative for rash.  Neurological: Negative for headaches.  Hematological: Does not bruise/bleed easily.  Psychiatric/Behavioral: Negative for dysphoric mood. The patient is not nervous/anxious.        Objective:   Physical Exam Constitutional:  Overweight female, no acute distress  HENT:  Nares patent without discharge  Oropharynx without exudate, palate and uvula are normal  Eyes:  Perrla, eomi, no scleral icterus  Neck:  No JVD, no TMG  Cardiovascular:  Irregular rhythm with CVR, no rubs or gallops.  No murmurs       decreased distal pulses  Pulmonary :  Mildly decreased BS, no stridor or respiratory distress   No rhonchi, or wheezing.  +bibasilar crackles.  Abdominal:  Soft, nondistended, bowel sounds present.  No tenderness noted.   Musculoskeletal:  3+ lower extremity edema noted.  Lymph Nodes:  No cervical lymphadenopathy noted  Skin:  No cyanosis noted  Neurologic:  Alert, appropriate, moves all 4 extremities without obvious deficit.         Assessment & Plan:

## 2011-08-18 LAB — PULMONARY FUNCTION TEST

## 2011-08-25 ENCOUNTER — Other Ambulatory Visit: Payer: Medicare Other

## 2011-08-25 ENCOUNTER — Encounter: Payer: Self-pay | Admitting: Pulmonary Disease

## 2011-08-25 ENCOUNTER — Ambulatory Visit (INDEPENDENT_AMBULATORY_CARE_PROVIDER_SITE_OTHER): Payer: Medicare Other | Admitting: Pulmonary Disease

## 2011-08-25 DIAGNOSIS — R0609 Other forms of dyspnea: Secondary | ICD-10-CM

## 2011-08-25 DIAGNOSIS — J449 Chronic obstructive pulmonary disease, unspecified: Secondary | ICD-10-CM

## 2011-08-25 DIAGNOSIS — R0989 Other specified symptoms and signs involving the circulatory and respiratory systems: Secondary | ICD-10-CM

## 2011-08-25 LAB — PULMONARY FUNCTION TEST

## 2011-08-25 NOTE — Assessment & Plan Note (Signed)
The patient was surprisingly found to have at least moderate airflow obstruction with air trapping by her pulmonary function studies.  She has never smoked, and has no history to suggest asthma.  I suspect she may have had occult asthma over the years, and has a component of senile emphysema as well.  Will check an alpha-1 antitrypsin level today for completeness.  We'll start the patient on a good bronchodilator regimen to see if she has improvement, and I also asked her to wear oxygen at HS and with sustained activity.  I have also told the pt and family that her overall shortness of breath is really multifactorial, and related to her underlying lung issue, heart disease, debility, and deconditioning.

## 2011-08-25 NOTE — Patient Instructions (Signed)
Wear oxygen all night every night while sleeping no matter what.  Also, use oxygen with any sustained activity Will start on dulera 100/5  2 inhalations am and pm everyday.  Keep mouth rinsed well.  Will check bloodwork today for hereditary form of emphysema. Please call me in 4 weeks with how things are going with new inhaler. Would like to see you back in 3 mos to check on progress.

## 2011-08-25 NOTE — Progress Notes (Signed)
PFT done today. 

## 2011-08-25 NOTE — Progress Notes (Signed)
  Subjective:    Patient ID: [name], female    DOB: 07/23/1926, 78 y.o.   MRN: [id number]  HPI The patient comes in today for followup of her recent very function studies.  She was surprisingly found to have moderate to severe airflow obstruction, as well as significant air trapping.  She did not have any restriction, and her diffusion capacity was severely reduced.  She also had an overnight oximetry done which showed desaturation as low as 74%, with over 7 hours spent less than 89% during the night.  I have reviewed the studies with her and her family in detail, and answered all of their questions.   Review of Systems  Constitutional: Negative for fever and unexpected weight change.  HENT: Positive for rhinorrhea and sneezing. Negative for ear pain, nosebleeds, congestion, sore throat, trouble swallowing, dental problem, postnasal drip and sinus pressure.   Eyes: Negative for redness and itching.  Respiratory: Positive for cough, chest tightness, shortness of breath and wheezing.   Cardiovascular: Positive for leg swelling. Negative for palpitations.  Gastrointestinal: Negative for nausea and vomiting.  Genitourinary: Negative for dysuria.  Musculoskeletal: Negative for joint swelling.  Skin: Negative for rash.  Neurological: Negative for headaches.  Hematological: Bruises/bleeds easily.  Psychiatric/Behavioral: Negative for dysphoric mood. The patient is not nervous/anxious.        Objective:   Physical Exam frail appearing female in no acute distress Nose without purulent discharge noted Chest with decreased breath sounds, no wheezing Cardiac exam with controlled ventricular response Alert and oriented, moves all 4 extremities.       Assessment & Plan:

## 2011-08-26 ENCOUNTER — Ambulatory Visit (INDEPENDENT_AMBULATORY_CARE_PROVIDER_SITE_OTHER): Payer: Medicare Other | Admitting: *Deleted

## 2011-08-26 DIAGNOSIS — I4891 Unspecified atrial fibrillation: Secondary | ICD-10-CM

## 2011-08-26 DIAGNOSIS — I482 Chronic atrial fibrillation, unspecified: Secondary | ICD-10-CM

## 2011-08-26 DIAGNOSIS — Z7901 Long term (current) use of anticoagulants: Secondary | ICD-10-CM

## 2011-08-26 LAB — POCT INR: INR: 2.4

## 2011-09-02 LAB — ALPHA-1 ANTITRYPSIN PHENOTYPE: A-1 Antitrypsin: 166 mg/dL (ref 83–199)

## 2011-09-04 ENCOUNTER — Encounter: Payer: Self-pay | Admitting: Pulmonary Disease

## 2011-09-08 ENCOUNTER — Encounter: Payer: Self-pay | Admitting: Pulmonary Disease

## 2011-09-23 ENCOUNTER — Ambulatory Visit (INDEPENDENT_AMBULATORY_CARE_PROVIDER_SITE_OTHER): Payer: Medicare Other | Admitting: *Deleted

## 2011-09-23 DIAGNOSIS — Z7901 Long term (current) use of anticoagulants: Secondary | ICD-10-CM

## 2011-09-23 DIAGNOSIS — I4891 Unspecified atrial fibrillation: Secondary | ICD-10-CM

## 2011-09-23 DIAGNOSIS — I482 Chronic atrial fibrillation, unspecified: Secondary | ICD-10-CM

## 2011-09-23 LAB — POCT INR: INR: 3

## 2011-10-21 ENCOUNTER — Ambulatory Visit (INDEPENDENT_AMBULATORY_CARE_PROVIDER_SITE_OTHER): Payer: Medicare Other | Admitting: *Deleted

## 2011-10-21 DIAGNOSIS — I482 Chronic atrial fibrillation, unspecified: Secondary | ICD-10-CM

## 2011-10-21 DIAGNOSIS — Z7901 Long term (current) use of anticoagulants: Secondary | ICD-10-CM

## 2011-10-21 DIAGNOSIS — I4891 Unspecified atrial fibrillation: Secondary | ICD-10-CM

## 2011-10-21 LAB — POCT INR: INR: 2.1

## 2011-11-10 ENCOUNTER — Encounter: Payer: Self-pay | Admitting: Cardiology

## 2011-11-18 ENCOUNTER — Ambulatory Visit (INDEPENDENT_AMBULATORY_CARE_PROVIDER_SITE_OTHER): Payer: Medicare Other | Admitting: *Deleted

## 2011-11-18 DIAGNOSIS — Z7901 Long term (current) use of anticoagulants: Secondary | ICD-10-CM

## 2011-11-18 DIAGNOSIS — I482 Chronic atrial fibrillation, unspecified: Secondary | ICD-10-CM

## 2011-11-18 DIAGNOSIS — I4891 Unspecified atrial fibrillation: Secondary | ICD-10-CM

## 2011-11-23 ENCOUNTER — Ambulatory Visit: Payer: Medicare Other | Admitting: Pulmonary Disease

## 2011-12-16 ENCOUNTER — Ambulatory Visit (INDEPENDENT_AMBULATORY_CARE_PROVIDER_SITE_OTHER): Payer: Medicare Other | Admitting: *Deleted

## 2011-12-16 ENCOUNTER — Encounter: Payer: Self-pay | Admitting: Cardiology

## 2011-12-16 ENCOUNTER — Ambulatory Visit (INDEPENDENT_AMBULATORY_CARE_PROVIDER_SITE_OTHER): Payer: Medicare Other | Admitting: Cardiology

## 2011-12-16 VITALS — BP 157/76 | HR 82 | Resp 16 | Ht 60.0 in | Wt 161.0 lb

## 2011-12-16 DIAGNOSIS — I4891 Unspecified atrial fibrillation: Secondary | ICD-10-CM

## 2011-12-16 DIAGNOSIS — J449 Chronic obstructive pulmonary disease, unspecified: Secondary | ICD-10-CM

## 2011-12-16 DIAGNOSIS — I482 Chronic atrial fibrillation, unspecified: Secondary | ICD-10-CM

## 2011-12-16 DIAGNOSIS — I272 Pulmonary hypertension, unspecified: Secondary | ICD-10-CM

## 2011-12-16 DIAGNOSIS — I2789 Other specified pulmonary heart diseases: Secondary | ICD-10-CM

## 2011-12-16 DIAGNOSIS — N200 Calculus of kidney: Secondary | ICD-10-CM

## 2011-12-16 DIAGNOSIS — Z7901 Long term (current) use of anticoagulants: Secondary | ICD-10-CM

## 2011-12-16 DIAGNOSIS — I10 Essential (primary) hypertension: Secondary | ICD-10-CM

## 2011-12-16 DIAGNOSIS — E785 Hyperlipidemia, unspecified: Secondary | ICD-10-CM

## 2011-12-16 DIAGNOSIS — J4489 Other specified chronic obstructive pulmonary disease: Secondary | ICD-10-CM

## 2011-12-16 DIAGNOSIS — D696 Thrombocytopenia, unspecified: Secondary | ICD-10-CM

## 2011-12-16 MED ORDER — FUROSEMIDE 40 MG PO TABS: 60.0000 mg | ORAL_TABLET | Freq: Two times a day (BID) | ORAL | Status: DC

## 2011-12-16 NOTE — Progress Notes (Deleted)
Name: [name]    DOB: 02-07-1927  Age: 78 y.o.  MR#: [id number]       PCP:  Carylon Perches, MD, MD      Insurance: @PAYORNAME @   CC:    Chief Complaint  Patient presents with  . Appointment    pt c/o her feet hurting +med list    VS BP 157/76  Pulse 82  Resp 16  Ht 5' (1.524 m)  Wt 161 lb (73.029 kg)  BMI 31.44 kg/m2  Weights Current Weight  12/16/11 161 lb (73.029 kg)  08/25/11 164 lb (74.39 kg)  08/07/11 167 lb (75.751 kg)    Blood Pressure  BP Readings from Last 3 Encounters:  12/16/11 157/76  08/25/11 120/62  08/07/11 140/80     Admit date:  (Not on file) Last encounter with RMR:  Visit date not found   Allergy Allergies  Allergen Reactions  . Baycol (Cerivastatin Sodium) Other (See Comments)    Rhabdomyolysis   . Iohexol      Desc: PT GOT VERY SOB DURING CARDIAC CATH IN 1998/ STATES THEY HAD TO STOP DOING THE CATH DUE TO THIS/ ALSO PRODUCED SEVERE HEADACHE FOR WEEKS/ PT REFUSING FURTHER CONTRAST/ TSF   . Shrimp (Shellfish Allergy) Other (See Comments)    gout  . Toprol Xl (Metoprolol Succinate)     Cough and extreme SOB    Current Outpatient Prescriptions  Medication Sig Dispense Refill  . digoxin (LANOXIN) 0.125 MG tablet TAKE ONE (1) TABLET BY MOUTH EVERY      DAY  15 tablet  0  . diltiazem (CARDIZEM CD) 240 MG 24 hr capsule Take 1 tablet by mouth daily.      . furosemide (LASIX) 40 MG tablet Take 40 mg by mouth 2 (two) times daily.      Marland Kitchen HYDROcodone-acetaminophen (VICODIN) 5-500 MG per tablet Take 1 tablet by mouth daily.       . potassium chloride SA (K-DUR,KLOR-CON) 20 MEQ tablet Take 20 mEq by mouth 2 (two) times daily. .      . warfarin (COUMADIN) 5 MG tablet Take 7.5-10 mg by mouth daily. 10 mg Monday, Wednesday & Friday, and takes 7.5 mg all other days        Discontinued Meds:   There are no discontinued medications.  Patient Active Problem List  Diagnoses  . GOUT  . THROMBOCYTOPENIA  . NEPHROLITHIASIS  . Chronic anticoagulation  .  Pulmonary hypertension  . Hypertension  . Arteriosclerotic cardiovascular disease (ASCVD)  . Chronic atrial fibrillation  . Hyperlipidemia  . Hyperparathyroidism  . Degenerative joint disease  . COPD (chronic obstructive pulmonary disease)    LABS Anti-coag visit on 10/21/2011  Component Date Value  . INR 11/18/2011 1.9   Anti-coag visit on 09/23/2011  Component Date Value  . INR 10/21/2011 2.1      Results for this Opt Visit:     Results for orders placed in visit on 10/21/11  POCT INR      Component Value Range   INR 1.9      EKG Orders placed in visit on 05/27/11  . EKG 12-LEAD     Prior Assessment and Plan Problem List as of 12/16/2011          Cardiology Problems   Pulmonary hypertension   Last Assessment & Plan Note   04/03/2011 Office Visit Signed 04/03/2011  4:35 PM by Jodelle Gross, NP    She is on appropriate medications for [facility], to include digoxin,  CCB.  She appears to be stable at this time, but may need further medical management with additional medications to include epoprostenol IV as she continues to have LEE and shortness of breath on occasion.  I would recommend referral to CHF clinic with Dr. Gala Romney on next evaluation by Dr. Dietrich Pates. In the interim, I will increase her lasix to 40 mg in the am and 20 mg in the pm for another 3 days.  She may need to be considered for right heart cardiac catheterization for better evaluation prior to referral and medication adjustments. She is to see Dr. Dietrich Pates in 2 weeks.                                                                                                                                                                                                                                                                                                                                                                                                                                                        Hypertension    Last Assessment & Plan Note   04/22/2011 Office Visit Signed 04/23/2011  6:42 PM by Kathlen Brunswick, MD    Blood pressure control is excellent; current medication will be continued.    Arteriosclerotic cardiovascular disease (ASCVD)   Last Assessment & Plan Note   04/22/2011 Office  Visit Addendum 04/26/2011 10:51 PM by Kathlen Brunswick, MD    Patient has had a remarkably stable course since percutaneous intervention for single vessel disease 14 years ago.  Our approach will continue to be optimal control of cardiovascular risk factors in an attempt to prevent recurrent symptomatic coronary disease.    Chronic atrial fibrillation   Last Assessment & Plan Note   04/22/2011 Office Visit Signed 04/23/2011  6:40 PM by Kathlen Brunswick, MD    Heart rate in atrial fibrillation is well-controlled.  Previously concern was raised that diltiazem might be contributing to peripheral edema.  Substitution of metoprolol resulted in severe dyspnea prompting resumption of her previous medication on which she is now doing well.    Hyperlipidemia   Last Assessment & Plan Note   04/22/2011 Office Visit Signed 04/23/2011  6:41 PM by Kathlen Brunswick, MD    No recent lipid testing is available to me.  A lipid profile will be obtained the next time diagnostic phlebotomy is performed.      Other   GOUT   THROMBOCYTOPENIA   Last Assessment & Plan Note   04/22/2011 Office Visit Signed 04/23/2011  6:43 PM by Kathlen Brunswick, MD    Platelets 104-115,000 over the past year.  Thrombocytopenia at this mild level probably did not contribute to her hematoma.  Continued observation is appropriate.    NEPHROLITHIASIS   Chronic anticoagulation   Last Assessment & Plan Note   04/22/2011 Office Visit Signed 04/23/2011  6:39 PM by Kathlen Brunswick, MD    Patient has done well with warfarin therapy until this incident, which still represents a minor complication.  We will resume Coumadin and maintain a therapeutic INR.   Patient was reassured that bruising will resolve over the course of the next few weeks.  We will continue to monitor stool Hemoccults and CBC to exclude occult GI blood loss.    Hyperparathyroidism   Degenerative joint disease   COPD (chronic obstructive pulmonary disease)   Last Assessment & Plan Note   08/25/2011 Office Visit Signed 08/25/2011 12:51 PM by Barbaraann Share, MD    The patient was surprisingly found to have at least moderate airflow obstruction with air trapping by her pulmonary function studies.  She has never smoked, and has no history to suggest asthma.  I suspect she may have had occult asthma over the years, and has a component of senile emphysema as well.  Will check an alpha-1 antitrypsin level today for completeness.  We'll start the patient on a good bronchodilator regimen to see if she has improvement, and I also asked her to wear oxygen at HS and with sustained activity.  I have also told the pt and family that her overall shortness of breath is really multifactorial, and related to her underlying lung issue, heart disease, debility, and deconditioning.        Imaging: No results found.   FRS Calculation: Score not calculated

## 2011-12-16 NOTE — Patient Instructions (Signed)
Your physician recommends that you schedule a follow-up appointment in: 8 months  Schedule follow up with Dr Shelle Iron as soon as possible  Your physician has recommended you make the following change in your medication:  1 - INCREASE Lasix to 60 mg twice a day  Your physician recommends that you return for lab work in: On or around July 1st  Stool cards x 3 and return to office as soon as possible

## 2011-12-16 NOTE — Progress Notes (Signed)
Patient ID: [name], female   DOB: Mar 31, 1927, 78 y.o.   MRN: [id number]  HPI: Scheduled return visit for this very nice woman with a history of hypertension, coronary artery disease and chronic atrial fibrillation.  She has tolerated long-term anticoagulation without serious complications.  She was recently evaluated by Dr. Shelle Iron, who prescribed bronchodilators, which have improved exertional dyspnea, and continuous oxygen, which she is using minimally.  Chronic edema is unchanged.  Prior to Admission medications   Medication Sig Start Date End Date Taking? Authorizing Provider  digoxin (LANOXIN) 0.125 MG tablet TAKE ONE (1) TABLET BY MOUTH EVERY      DAY 02/12/11  Yes Kathlen Brunswick, MD  diltiazem (CARDIZEM CD) 240 MG 24 hr capsule Take 1 tablet by mouth daily. 03/18/11  Yes Historical Provider, MD  furosemide (LASIX) 40 MG tablet Take 1.5 tablets (60 mg total) by mouth 2 (two) times daily. 12/16/11  Yes Kathlen Brunswick, MD  HYDROcodone-acetaminophen (VICODIN) 5-500 MG per tablet Take 1 tablet by mouth daily.  01/28/11  Yes Historical Provider, MD  potassium chloride SA (K-DUR,KLOR-CON) 20 MEQ tablet Take 20 mEq by mouth 2 (two) times daily. . 04/22/11  Yes Kathlen Brunswick, MD  warfarin (COUMADIN) 5 MG tablet Take 7.5-10 mg by mouth daily. 10 mg Monday, Wednesday & Friday, and takes 7.5 mg all other days   Yes Historical Provider, MD   Allergies  Allergen Reactions  . Baycol (Cerivastatin Sodium) Other (See Comments)    Rhabdomyolysis   . Iohexol      Desc: PT GOT VERY SOB DURING CARDIAC CATH IN 1998/ STATES THEY HAD TO STOP DOING THE CATH DUE TO THIS/ ALSO PRODUCED SEVERE HEADACHE FOR WEEKS/ PT REFUSING FURTHER CONTRAST/ TSF   . Shrimp (Shellfish Allergy) Other (See Comments)    gout  . Toprol Xl (Metoprolol Succinate)     Cough and extreme SOB     Past medical history, social history, and family history reviewed and updated.  ROS: Mail or PND.  She has class III exertional  dyspnea without wheezing.  She denies sputum production, febrile illnesses or recent apparent exacerbations of chronic obstructive pulmonary disease.  All other systems reviewed and are negative.  PHYSICAL EXAM: BP 157/76  Pulse 82  Resp 16  Ht 5' (1.524 m)  Wt 73.029 kg (161 lb)  BMI 31.44 kg/m2; weight is down 3 pounds since previous visit 4 months ago General-Well developed; no acute distress Body habitus-proportionate weight and height Neck-No JVD; no carotid bruits Lungs-clear lung fields; resonant to percussion; mild kyphosis Cardiovascular-normal PMI; normal S1 and S2 Abdomen-normal bowel sounds; soft and non-tender without masses or organomegaly Musculoskeletal-No deformities, no cyanosis or clubbing Neurologic-Normal cranial nerves; symmetric strength and tone Skin-Warm, no significant lesions Extremities-distal pulses intact; 1-2+ pitting edema below the knees  ASSESSMENT AND PLAN:  Jenna Bing, MD 12/16/2011 12:15 PM

## 2011-12-16 NOTE — Assessment & Plan Note (Signed)
Heart rate is well controlled in atrial fibrillation.  Current medication will be continued.

## 2011-12-16 NOTE — Assessment & Plan Note (Signed)
Functional status is relatively good at present.  Pulmonary hypertension unlikely to be primary.  The benefit of an extensive evaluation and high cost therapy, such as prostaglandin infusion, is uncertain.  We will continue basic therapy, which has proven effective to date.

## 2011-12-16 NOTE — Assessment & Plan Note (Addendum)
CBC normal in 06/2011; stool Hemoccult testing is pending.  Patient does not believe she has undergone colonoscopy within the last 10 years.  Polypectomy may have been required in the previous study.  The patient will discuss the advisability of colonoscopy with her PCP, Dr. Ouida Sills.

## 2011-12-18 ENCOUNTER — Ambulatory Visit (INDEPENDENT_AMBULATORY_CARE_PROVIDER_SITE_OTHER): Payer: Medicare Other | Admitting: Pulmonary Disease

## 2011-12-18 ENCOUNTER — Encounter: Payer: Self-pay | Admitting: Pulmonary Disease

## 2011-12-18 VITALS — BP 132/62 | HR 75 | Temp 97.7°F | Ht 60.0 in | Wt 161.4 lb

## 2011-12-18 DIAGNOSIS — J449 Chronic obstructive pulmonary disease, unspecified: Secondary | ICD-10-CM

## 2011-12-18 MED ORDER — MOMETASONE FURO-FORMOTEROL FUM 100-5 MCG/ACT IN AERO: 2.0000 | INHALATION_SPRAY | Freq: Two times a day (BID) | RESPIRATORY_TRACT | Status: DC

## 2011-12-18 MED ORDER — ALBUTEROL SULFATE HFA 108 (90 BASE) MCG/ACT IN AERS: 2.0000 | INHALATION_SPRAY | Freq: Four times a day (QID) | RESPIRATORY_TRACT | Status: DC | PRN

## 2011-12-18 NOTE — Patient Instructions (Signed)
Stay on oxygen at night everynight Stay on dulera 2 puffs am and pm everyday.  Rinse mouth well. Can use albuterol inhaler 2 puffs up to every 6hrs if needed for an emergency. If doing well, followup with me in 6mos.

## 2011-12-18 NOTE — Progress Notes (Signed)
  Subjective:    Patient ID: [name], female    DOB: Apr 03, 1927, 78 y.o.   MRN: [id number]  HPI The patient in today for followup of her known COPD.  She was started on dulera at the last visit, and has seen improvement in her breathing.  She feels that it has helped with her exertional tolerance, and denies any significant cough.  She has had some issues with worsening edema, and her diuretic dose is being adjusted by cardiology.   Review of Systems  Constitutional: Negative.  Negative for fever and unexpected weight change.  HENT: Positive for rhinorrhea, sneezing and postnasal drip. Negative for ear pain, nosebleeds, congestion, sore throat, trouble swallowing, dental problem and sinus pressure.   Eyes: Positive for itching. Negative for redness.  Respiratory: Positive for cough. Negative for chest tightness, shortness of breath and wheezing.   Cardiovascular: Positive for leg swelling. Negative for palpitations.  Gastrointestinal: Negative.  Negative for nausea and vomiting.  Genitourinary: Negative.  Negative for dysuria.  Musculoskeletal: Negative.  Negative for joint swelling.  Skin: Negative.  Negative for rash.  Neurological: Positive for headaches.  Hematological: Bruises/bleeds easily.  Psychiatric/Behavioral: Negative.  Negative for dysphoric mood. The patient is not nervous/anxious.        Objective:   Physical Exam Well-developed female in no acute distress Nose without purulence or discharge noted Chest with decreased breath sounds, a few basal crackles, no wheezing Cardiac exam with irregular rhythm, but controlled response. Lower extremities with 2+ edema bilaterally, no cyanosis Alert and oriented, moves all 4 extremities.       Assessment & Plan:

## 2011-12-18 NOTE — Assessment & Plan Note (Signed)
The patient feels that her breathing is better on dulera, and therefore we will continue her on this.  She is also following closely with cardiology for management of her volume status.  If she is continuing to do well, we'll see her back in 6 months.

## 2012-01-10 IMAGING — CR DG RIBS W/ CHEST 3+V*L*
4 series · 4 of 4 positions shown · non-contrast
Comparison: Chest radiograph dated 11/27/2010

CLINICAL DATA: Pain/bruising to left chest/abdomen

LEFT RIBS AND CHEST - 3+ VIEW

[view not recorded (1 of 4)]
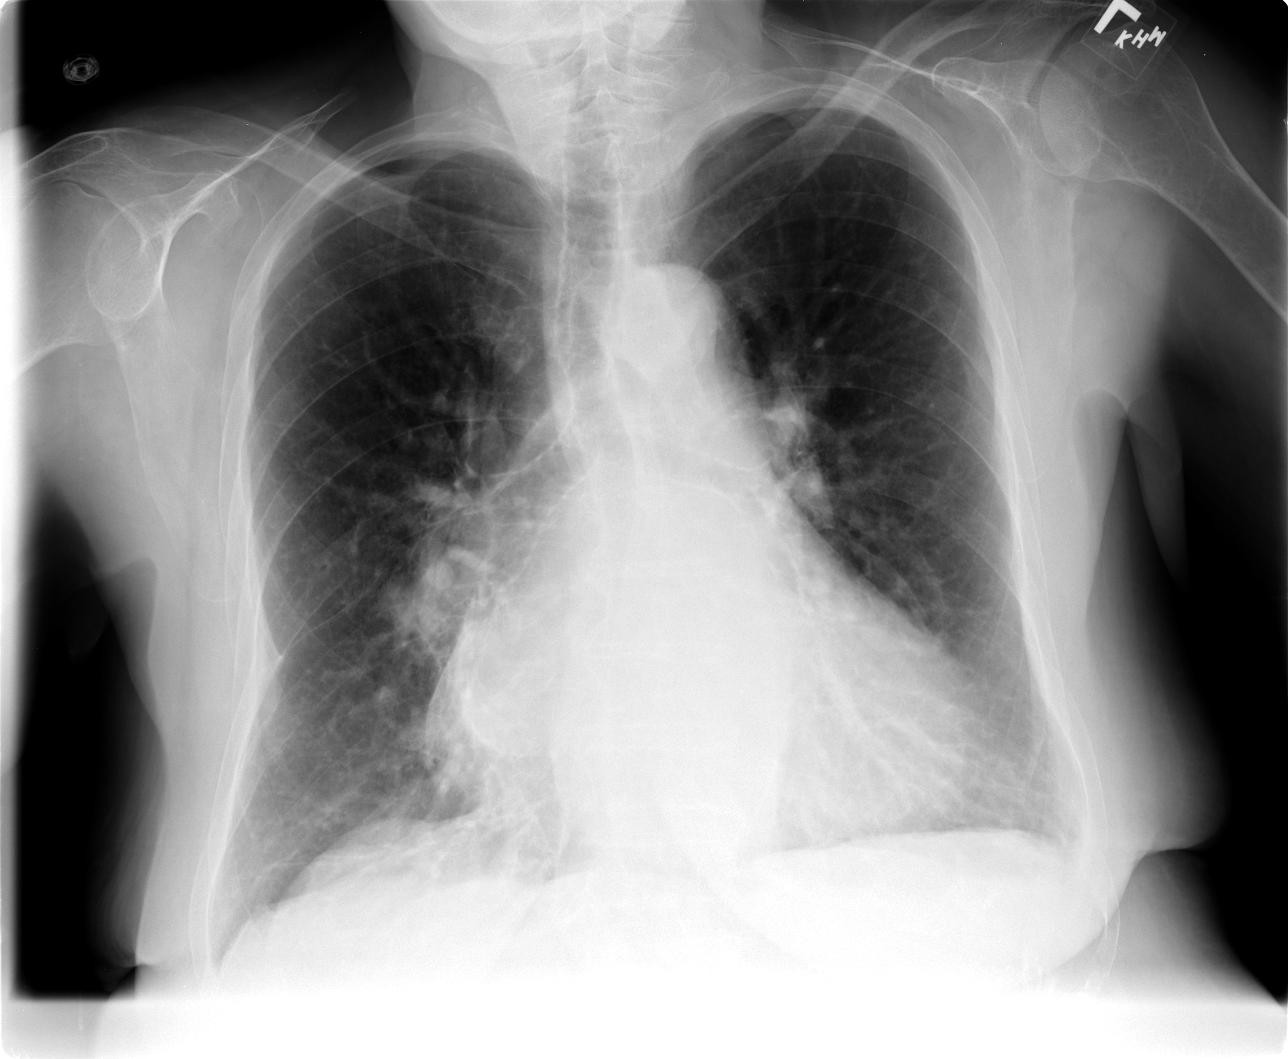

[view not recorded (2 of 4)]
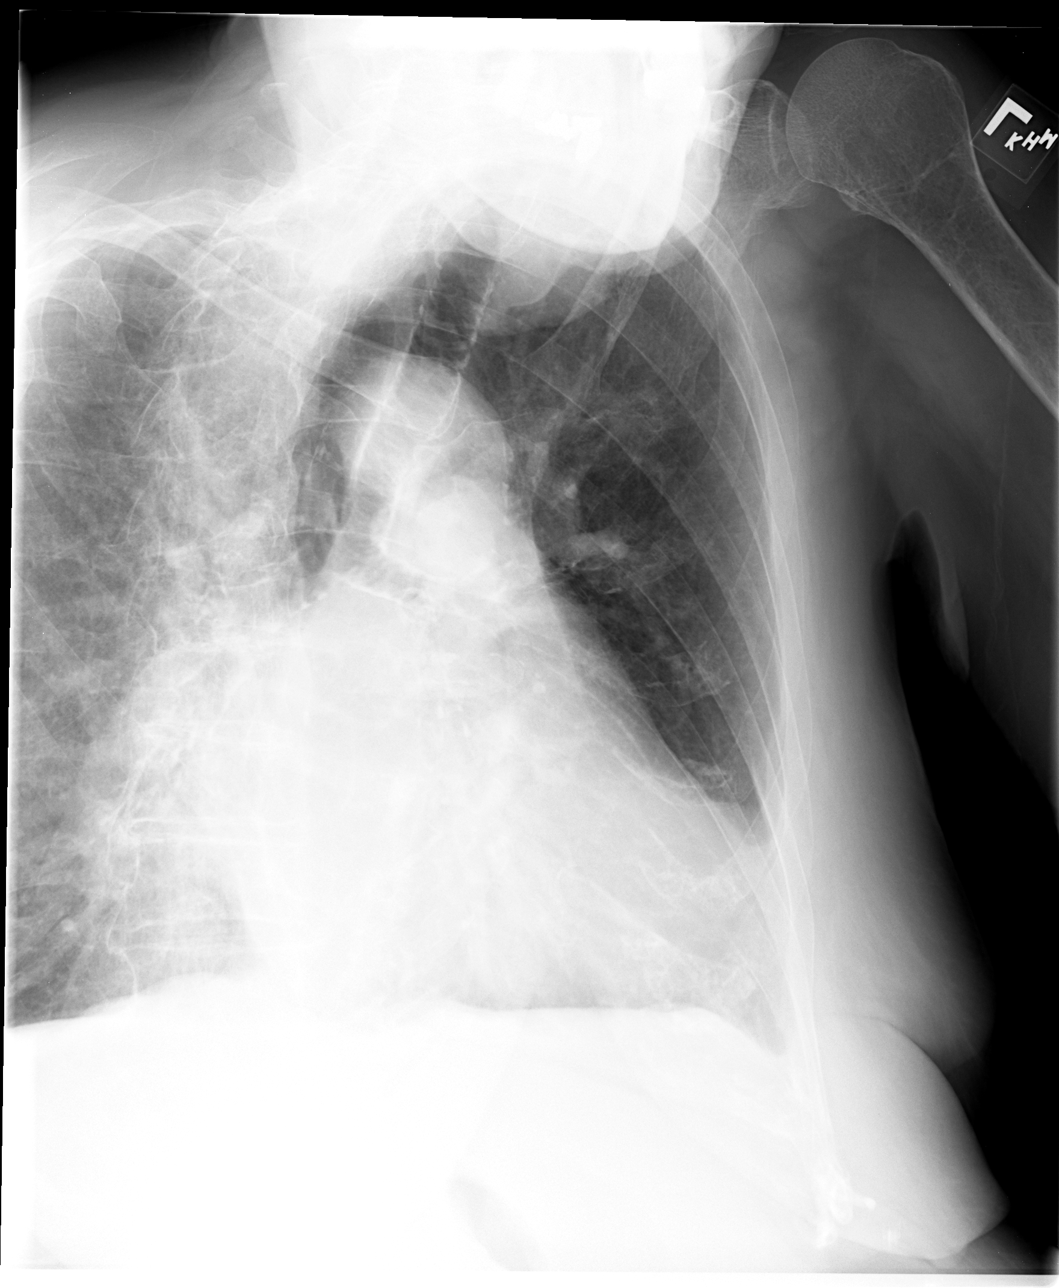

[view not recorded (3 of 4)]
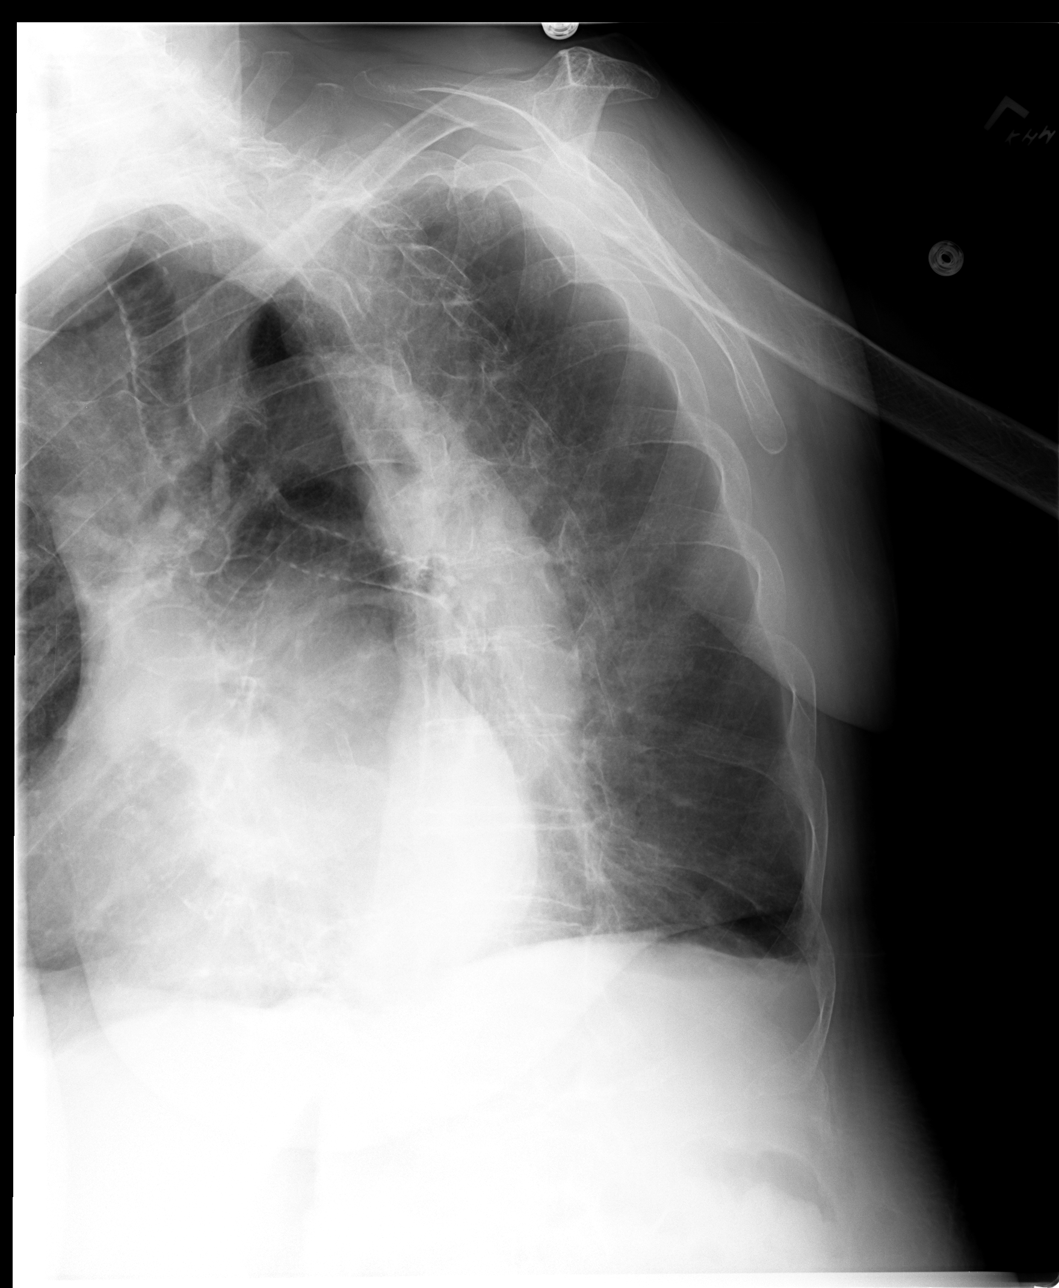

[view not recorded (4 of 4)]
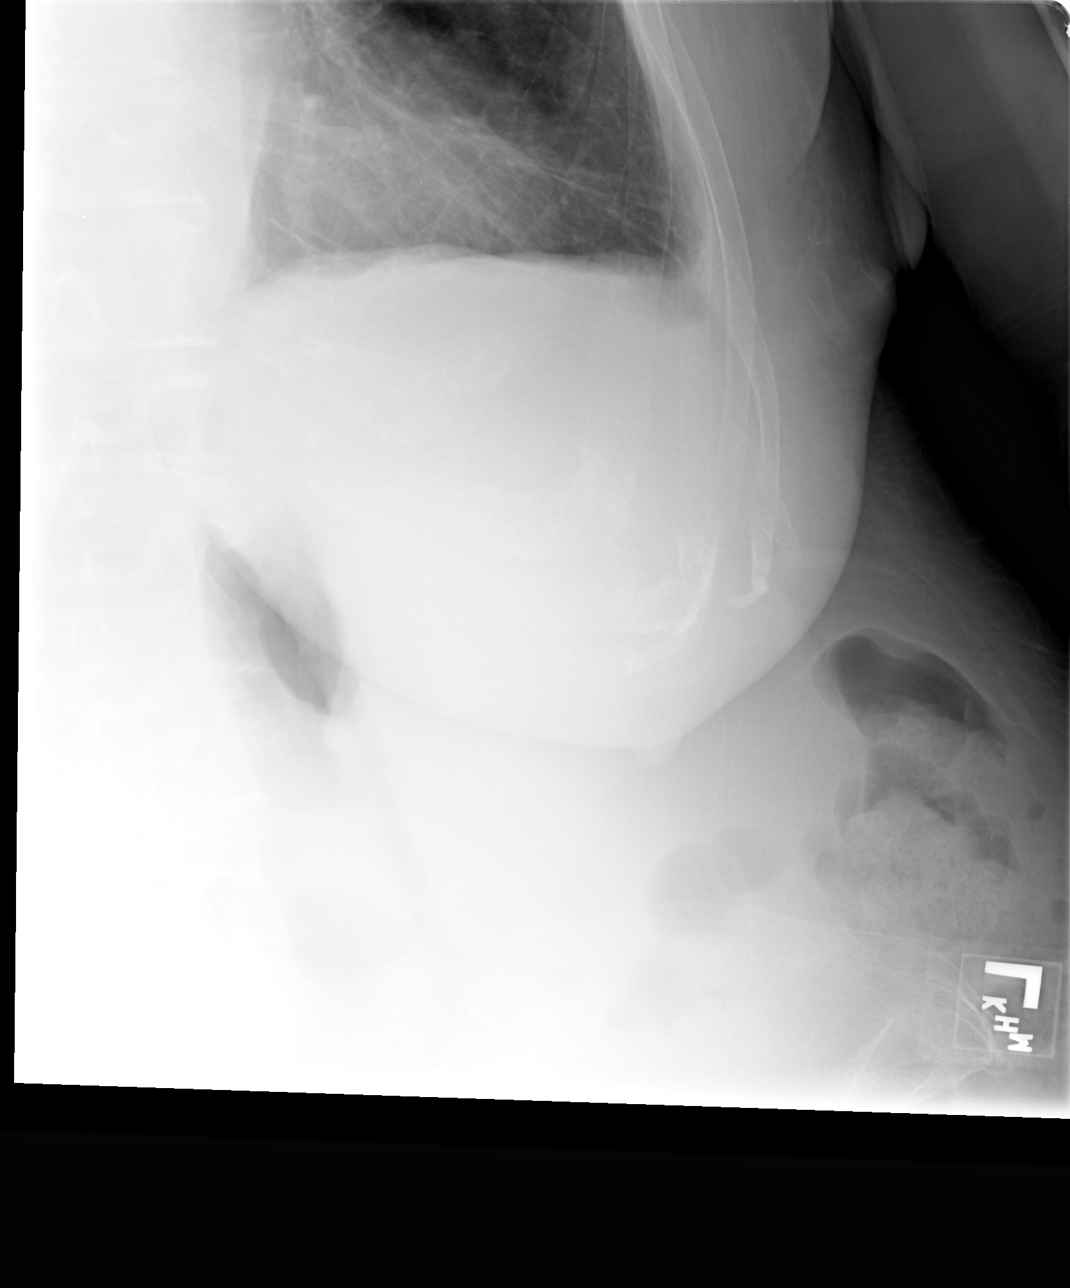

[4 of 4 positions shown; findings below may reference images not displayed]

FINDINGS: Chronic interstitial markings. No pleural effusion or
pneumothorax.

Stable cardiomegaly.

No left rib fracture is seen.
IMPRESSION: No evidence of acute cardiopulmonary disease.

No left rib fracture is seen.

## 2012-01-13 ENCOUNTER — Ambulatory Visit (INDEPENDENT_AMBULATORY_CARE_PROVIDER_SITE_OTHER): Payer: Medicare Other | Admitting: *Deleted

## 2012-01-13 DIAGNOSIS — Z7901 Long term (current) use of anticoagulants: Secondary | ICD-10-CM

## 2012-01-13 DIAGNOSIS — I4891 Unspecified atrial fibrillation: Secondary | ICD-10-CM

## 2012-01-13 DIAGNOSIS — I482 Chronic atrial fibrillation, unspecified: Secondary | ICD-10-CM

## 2012-01-13 LAB — POCT INR: INR: 2

## 2012-01-15 ENCOUNTER — Encounter (INDEPENDENT_AMBULATORY_CARE_PROVIDER_SITE_OTHER): Payer: Medicare Other

## 2012-01-15 DIAGNOSIS — Z7901 Long term (current) use of anticoagulants: Secondary | ICD-10-CM

## 2012-01-20 ENCOUNTER — Other Ambulatory Visit: Payer: Self-pay

## 2012-01-20 DIAGNOSIS — Z7901 Long term (current) use of anticoagulants: Secondary | ICD-10-CM

## 2012-02-04 ENCOUNTER — Encounter: Payer: Self-pay | Admitting: *Deleted

## 2012-02-04 ENCOUNTER — Other Ambulatory Visit: Payer: Self-pay | Admitting: *Deleted

## 2012-02-04 DIAGNOSIS — I10 Essential (primary) hypertension: Secondary | ICD-10-CM

## 2012-02-09 LAB — COMPREHENSIVE METABOLIC PANEL
BUN: 23 mg/dL (ref 6–23)
CO2: 31 mEq/L (ref 19–32)
Calcium: 8.8 mg/dL (ref 8.4–10.5)
Chloride: 99 mEq/L (ref 96–112)
Creat: 0.88 mg/dL (ref 0.50–1.10)

## 2012-02-10 ENCOUNTER — Ambulatory Visit (INDEPENDENT_AMBULATORY_CARE_PROVIDER_SITE_OTHER): Payer: Medicare Other | Admitting: *Deleted

## 2012-02-10 DIAGNOSIS — Z7901 Long term (current) use of anticoagulants: Secondary | ICD-10-CM

## 2012-02-10 DIAGNOSIS — I4891 Unspecified atrial fibrillation: Secondary | ICD-10-CM

## 2012-02-10 DIAGNOSIS — I482 Chronic atrial fibrillation, unspecified: Secondary | ICD-10-CM

## 2012-02-25 ENCOUNTER — Ambulatory Visit (INDEPENDENT_AMBULATORY_CARE_PROVIDER_SITE_OTHER): Payer: Medicare Other | Admitting: *Deleted

## 2012-02-25 DIAGNOSIS — Z7901 Long term (current) use of anticoagulants: Secondary | ICD-10-CM

## 2012-02-25 DIAGNOSIS — I4891 Unspecified atrial fibrillation: Secondary | ICD-10-CM

## 2012-02-25 DIAGNOSIS — I482 Chronic atrial fibrillation, unspecified: Secondary | ICD-10-CM

## 2012-02-25 LAB — POCT INR: INR: 2.8

## 2012-03-10 ENCOUNTER — Inpatient Hospital Stay (HOSPITAL_COMMUNITY)
Admission: EM | Admit: 2012-03-10 | Discharge: 2012-03-12 | DRG: 292 | Disposition: A | Discharge status: Home / Self Care | Payer: Medicare Other | Attending: Internal Medicine | Admitting: Internal Medicine

## 2012-03-10 ENCOUNTER — Emergency Department (HOSPITAL_COMMUNITY): Payer: Medicare Other

## 2012-03-10 ENCOUNTER — Other Ambulatory Visit: Payer: Self-pay

## 2012-03-10 ENCOUNTER — Encounter (HOSPITAL_COMMUNITY): Payer: Self-pay | Admitting: Emergency Medicine

## 2012-03-10 DIAGNOSIS — Z7901 Long term (current) use of anticoagulants: Secondary | ICD-10-CM

## 2012-03-10 DIAGNOSIS — M1710 Unilateral primary osteoarthritis, unspecified knee: Secondary | ICD-10-CM | POA: Diagnosis present

## 2012-03-10 DIAGNOSIS — Z23 Encounter for immunization: Secondary | ICD-10-CM

## 2012-03-10 DIAGNOSIS — Z87442 Personal history of urinary calculi: Secondary | ICD-10-CM

## 2012-03-10 DIAGNOSIS — Z888 Allergy status to other drugs, medicaments and biological substances status: Secondary | ICD-10-CM

## 2012-03-10 DIAGNOSIS — M47817 Spondylosis without myelopathy or radiculopathy, lumbosacral region: Secondary | ICD-10-CM | POA: Diagnosis present

## 2012-03-10 DIAGNOSIS — Z9089 Acquired absence of other organs: Secondary | ICD-10-CM

## 2012-03-10 DIAGNOSIS — Z825 Family history of asthma and other chronic lower respiratory diseases: Secondary | ICD-10-CM

## 2012-03-10 DIAGNOSIS — E785 Hyperlipidemia, unspecified: Secondary | ICD-10-CM | POA: Diagnosis present

## 2012-03-10 DIAGNOSIS — E212 Other hyperparathyroidism: Secondary | ICD-10-CM | POA: Diagnosis present

## 2012-03-10 DIAGNOSIS — I482 Chronic atrial fibrillation, unspecified: Secondary | ICD-10-CM

## 2012-03-10 DIAGNOSIS — I2510 Atherosclerotic heart disease of native coronary artery without angina pectoris: Secondary | ICD-10-CM | POA: Diagnosis present

## 2012-03-10 DIAGNOSIS — Z9861 Coronary angioplasty status: Secondary | ICD-10-CM

## 2012-03-10 DIAGNOSIS — Z8249 Family history of ischemic heart disease and other diseases of the circulatory system: Secondary | ICD-10-CM

## 2012-03-10 DIAGNOSIS — IMO0002 Reserved for concepts with insufficient information to code with codable children: Secondary | ICD-10-CM | POA: Diagnosis present

## 2012-03-10 DIAGNOSIS — I509 Heart failure, unspecified: Principal | ICD-10-CM

## 2012-03-10 DIAGNOSIS — J441 Chronic obstructive pulmonary disease with (acute) exacerbation: Secondary | ICD-10-CM | POA: Diagnosis present

## 2012-03-10 DIAGNOSIS — Z7902 Long term (current) use of antithrombotics/antiplatelets: Secondary | ICD-10-CM

## 2012-03-10 DIAGNOSIS — I10 Essential (primary) hypertension: Secondary | ICD-10-CM

## 2012-03-10 DIAGNOSIS — Z90710 Acquired absence of both cervix and uterus: Secondary | ICD-10-CM

## 2012-03-10 DIAGNOSIS — I4891 Unspecified atrial fibrillation: Secondary | ICD-10-CM

## 2012-03-10 DIAGNOSIS — Z79899 Other long term (current) drug therapy: Secondary | ICD-10-CM

## 2012-03-10 DIAGNOSIS — J449 Chronic obstructive pulmonary disease, unspecified: Secondary | ICD-10-CM

## 2012-03-10 DIAGNOSIS — Z9981 Dependence on supplemental oxygen: Secondary | ICD-10-CM

## 2012-03-10 LAB — CBC WITH DIFFERENTIAL/PLATELET
Hemoglobin: 13.9 g/dL (ref 12.0–15.0)
Lymphs Abs: 1.6 10*3/uL (ref 0.7–4.0)
Monocytes Relative: 20 % — ABNORMAL HIGH (ref 3–12)
Neutro Abs: 5 10*3/uL (ref 1.7–7.7)
Neutrophils Relative %: 56 % (ref 43–77)
RBC: 4.31 MIL/uL (ref 3.87–5.11)

## 2012-03-10 LAB — BASIC METABOLIC PANEL
BUN: 19 mg/dL (ref 6–23)
Chloride: 99 mEq/L (ref 96–112)
GFR calc Af Amer: 66 mL/min — ABNORMAL LOW (ref >90)
Glucose, Bld: 107 mg/dL — ABNORMAL HIGH (ref 70–99)
Potassium: 3.5 mEq/L (ref 3.5–5.1)

## 2012-03-10 LAB — PROTIME-INR: INR: 2.22 — ABNORMAL HIGH (ref 0.00–1.49)

## 2012-03-10 LAB — DIGOXIN LEVEL: Digoxin Level: 1 ng/mL (ref 0.8–2.0)

## 2012-03-10 LAB — PRO B NATRIURETIC PEPTIDE: Pro B Natriuretic peptide (BNP): 1012 pg/mL — ABNORMAL HIGH (ref 0–450)

## 2012-03-10 MED ORDER — ONDANSETRON HCL 4 MG/2ML IJ SOLN: 4.0000 mg | Freq: Four times a day (QID) | INTRAMUSCULAR | Status: DC | PRN

## 2012-03-10 MED ORDER — NITROGLYCERIN 2 % TD OINT
1.0000 [in_us] | TOPICAL_OINTMENT | Freq: Once | TRANSDERMAL | Status: AC
  Administered 2012-03-10: 1 [in_us] via TOPICAL
  Filled 2012-03-10: qty 1

## 2012-03-10 MED ORDER — SODIUM CHLORIDE 0.9 % IV SOLN
INTRAVENOUS | Status: AC
  Administered 2012-03-10: 18:00:00 via INTRAVENOUS

## 2012-03-10 MED ORDER — METHYLPREDNISOLONE SODIUM SUCC 125 MG IJ SOLR
125.0000 mg | Freq: Four times a day (QID) | INTRAMUSCULAR | Status: DC
  Administered 2012-03-10 – 2012-03-11 (×3): 125 mg via INTRAVENOUS
  Filled 2012-03-10 (×3): qty 2

## 2012-03-10 MED ORDER — SODIUM CHLORIDE 0.9 % IJ SOLN
3.0000 mL | INTRAMUSCULAR | Status: DC | PRN
  Filled 2012-03-10: qty 3

## 2012-03-10 MED ORDER — ACETAMINOPHEN 325 MG PO TABS: 650.0000 mg | ORAL_TABLET | Freq: Four times a day (QID) | ORAL | Status: DC | PRN

## 2012-03-10 MED ORDER — FUROSEMIDE 10 MG/ML IJ SOLN
40.0000 mg | Freq: Three times a day (TID) | INTRAMUSCULAR | Status: DC
  Administered 2012-03-10 – 2012-03-11 (×2): 40 mg via INTRAVENOUS
  Filled 2012-03-10 (×2): qty 4

## 2012-03-10 MED ORDER — WARFARIN - PHYSICIAN DOSING INPATIENT: Freq: Every day | Status: DC

## 2012-03-10 MED ORDER — ALBUTEROL SULFATE (5 MG/ML) 0.5% IN NEBU
2.5000 mg | INHALATION_SOLUTION | Freq: Four times a day (QID) | RESPIRATORY_TRACT | Status: DC
  Administered 2012-03-10: 2.5 mg via RESPIRATORY_TRACT
  Filled 2012-03-10: qty 0.5

## 2012-03-10 MED ORDER — FUROSEMIDE 10 MG/ML IJ SOLN
80.0000 mg | Freq: Once | INTRAMUSCULAR | Status: AC
  Administered 2012-03-10: 80 mg via INTRAMUSCULAR
  Filled 2012-03-10: qty 8

## 2012-03-10 MED ORDER — WARFARIN SODIUM 5 MG PO TABS
7.5000 mg | ORAL_TABLET | ORAL | Status: DC
  Administered 2012-03-10: 7.5 mg via ORAL
  Filled 2012-03-10 (×2): qty 2

## 2012-03-10 MED ORDER — SODIUM CHLORIDE 0.9 % IV SOLN: 250.0000 mL | INTRAVENOUS | Status: DC | PRN

## 2012-03-10 MED ORDER — HYDROCODONE-ACETAMINOPHEN 5-325 MG PO TABS
1.0000 | ORAL_TABLET | ORAL | Status: DC | PRN
  Administered 2012-03-10 – 2012-03-11 (×2): 1 via ORAL
  Filled 2012-03-10 (×2): qty 1

## 2012-03-10 MED ORDER — POTASSIUM CHLORIDE CRYS ER 20 MEQ PO TBCR: 30.0000 meq | EXTENDED_RELEASE_TABLET | Freq: Two times a day (BID) | ORAL | Status: DC

## 2012-03-10 MED ORDER — MOMETASONE FURO-FORMOTEROL FUM 100-5 MCG/ACT IN AERO
2.0000 | INHALATION_SPRAY | Freq: Two times a day (BID) | RESPIRATORY_TRACT | Status: DC
  Administered 2012-03-11 – 2012-03-12 (×3): 2 via RESPIRATORY_TRACT
  Filled 2012-03-10: qty 13

## 2012-03-10 MED ORDER — ALBUTEROL SULFATE (5 MG/ML) 0.5% IN NEBU
2.5000 mg | INHALATION_SOLUTION | Freq: Four times a day (QID) | RESPIRATORY_TRACT | Status: DC
  Administered 2012-03-11 – 2012-03-12 (×5): 2.5 mg via RESPIRATORY_TRACT
  Filled 2012-03-10 (×5): qty 0.5

## 2012-03-10 MED ORDER — ALUM & MAG HYDROXIDE-SIMETH 200-200-20 MG/5ML PO SUSP: 30.0000 mL | Freq: Four times a day (QID) | ORAL | Status: DC | PRN

## 2012-03-10 MED ORDER — POTASSIUM CHLORIDE CRYS ER 20 MEQ PO TBCR
40.0000 meq | EXTENDED_RELEASE_TABLET | Freq: Three times a day (TID) | ORAL | Status: DC
  Administered 2012-03-10 (×2): 40 meq via ORAL
  Filled 2012-03-10 (×2): qty 2

## 2012-03-10 MED ORDER — PNEUMOCOCCAL VAC POLYVALENT 25 MCG/0.5ML IJ INJ
0.5000 mL | INJECTION | INTRAMUSCULAR | Status: AC
  Filled 2012-03-10: qty 0.5

## 2012-03-10 MED ORDER — BIOTENE DRY MOUTH MT LIQD: 15.0000 mL | OROMUCOSAL | Status: DC | PRN

## 2012-03-10 MED ORDER — ONDANSETRON HCL 4 MG PO TABS: 4.0000 mg | ORAL_TABLET | Freq: Four times a day (QID) | ORAL | Status: DC | PRN

## 2012-03-10 MED ORDER — ACETAMINOPHEN 650 MG RE SUPP: 650.0000 mg | Freq: Four times a day (QID) | RECTAL | Status: DC | PRN

## 2012-03-10 MED ORDER — WARFARIN SODIUM 5 MG PO TABS
5.0000 mg | ORAL_TABLET | ORAL | Status: DC
  Administered 2012-03-11: 5 mg via ORAL

## 2012-03-10 MED ORDER — SODIUM CHLORIDE 0.9 % IJ SOLN
3.0000 mL | Freq: Two times a day (BID) | INTRAMUSCULAR | Status: DC
  Administered 2012-03-10 – 2012-03-12 (×4): 3 mL via INTRAVENOUS
  Filled 2012-03-10 (×3): qty 3

## 2012-03-10 MED ORDER — ALBUTEROL SULFATE (5 MG/ML) 0.5% IN NEBU: 2.5000 mg | INHALATION_SOLUTION | RESPIRATORY_TRACT | Status: DC | PRN

## 2012-03-10 MED ORDER — DILTIAZEM HCL ER COATED BEADS 240 MG PO CP24
240.0000 mg | ORAL_CAPSULE | Freq: Every day | ORAL | Status: DC
  Administered 2012-03-11 – 2012-03-12 (×2): 240 mg via ORAL
  Filled 2012-03-10 (×2): qty 1

## 2012-03-10 MED ORDER — DIGOXIN 125 MCG PO TABS
0.1250 mg | ORAL_TABLET | Freq: Every day | ORAL | Status: DC
  Administered 2012-03-11 – 2012-03-12 (×2): 0.125 mg via ORAL
  Filled 2012-03-10 (×3): qty 1

## 2012-03-10 NOTE — ED Notes (Signed)
Attempted to call report to [facility], states that she will call back soon for report

## 2012-03-10 NOTE — ED Provider Notes (Signed)
History     CSN: [id number]  Arrival date & time 03/10/12  1317   First MD Initiated Contact with Patient 03/10/12 1322      Chief Complaint  Patient presents with  . Chest Pain    (Consider location/radiation/quality/duration/timing/severity/associated sxs/prior treatment) HPI Comments: The patient is an 78 year old woman who called her daughter today at work because she was very short of breath and her chest felt heavy. She used her inhalers and nasal oxygen without relief. She was therefore brought to the hospital for evaluation. Review of prior records shows a prior history of both congestive heart failure and atrial fibrillation as well as COPD.  Patient is a 78 y.o. female presenting with shortness of breath. The history is provided by the patient, a relative and medical records. No language interpreter was used.  Shortness of Breath  The current episode started today. The problem occurs occasionally. The problem has been rapidly worsening. The problem is severe. Nothing relieves the symptoms. Nothing aggravates the symptoms. Associated symptoms include chest pain and shortness of breath. Past medical history comments: COPD, atrial  fibrillation, peripheral edema.. Recently, medical care has been given by a specialist and at another facility. Services received include medications given.    Past Medical History  Diagnosis Date  . Hypertension   . Arteriosclerotic cardiovascular disease (ASCVD) 1998    80% LAD->PTCA;no significant disease in circumflex or RCA-1998; negative pharmacologic stress nuclear in 8/05  . Chronic atrial fibrillation 1998    DC cardioversion->  Recurrent AF  . Chronic anticoagulation   . Hyperlipidemia   . Hyperparathyroidism     parathyroidectomy  . Nephrolithiasis   . Degenerative joint disease     Chronic low back pain; knee pain  . Gout     Past Surgical History  Procedure Date  . Parathyroidectomy 1970s  . Orif hip fracture 2004    Left hip,  following fracture  . Knee arthroscopy     bilat.   . Kidney stone surgery     x 2  . Total abdominal hysterectomy late 1960s  . Coronary stent placement 1998  . Colonoscopy Prior to 2003    Family History  Problem Relation Age of Onset  . Heart attack Mother   . Heart attack Father   . Heart disease Mother   . Heart disease Father   . Emphysema Father   . Asthma Daughter     History  Substance Use Topics  . Smoking status: Never Smoker   . Smokeless tobacco: Not on file  . Alcohol Use: No    OB History    Grav Para Term Preterm Abortions TAB SAB Ect Mult Living                  Review of Systems  Constitutional: Negative.   Eyes: Negative.   Respiratory: Positive for chest tightness and shortness of breath.   Cardiovascular: Positive for chest pain and leg swelling.  Gastrointestinal: Negative.   Genitourinary: Negative.   Musculoskeletal: Negative.   Skin: Negative.   Neurological: Negative.   Psychiatric/Behavioral: Negative.     Allergies  Baycol; Iohexol; Shrimp; and Toprol xl  Home Medications   Current Outpatient Rx  Name Route Sig Dispense Refill  . ALBUTEROL SULFATE HFA 108 (90 BASE) MCG/ACT IN AERS Inhalation Inhale 2 puffs into the lungs every 6 (six) hours as needed for wheezing. 1 Inhaler 6  . CARBOXYMETHYLCELLULOSE SODIUM 0.5 % OP SOLN  1 drop as needed.    Marland Kitchen  DIGOXIN 0.125 MG PO TABS  TAKE ONE (1) TABLET BY MOUTH EVERY      DAY 15 tablet 0    PATIENT MUST CALL OFFICE AND SCHEDULE APPOINTMENT  ...  . DILTIAZEM HCL ER COATED BEADS 240 MG PO CP24 Oral Take 1 tablet by mouth daily.    . FUROSEMIDE 40 MG PO TABS Oral Take 1.5 tablets (60 mg total) by mouth 2 (two) times daily. 90 tablet 12  . HYDROCODONE-ACETAMINOPHEN 5-500 MG PO TABS Oral Take 1 tablet by mouth daily.     . MOMETASONE FURO-FORMOTEROL FUM 100-5 MCG/ACT IN AERO Inhalation Inhale 2 puffs into the lungs 2 (two) times daily. 1 Inhaler 6  . POTASSIUM CHLORIDE CRYS ER 20 MEQ PO TBCR  Oral Take 20 mEq by mouth 2 (two) times daily. .    Barron Alvine SODIUM 5 MG PO TABS Oral Take 7.5-10 mg by mouth daily. 10 mg Monday, Wednesday & Friday, and takes 7.5 mg all other days      Ht 5' (1.524 m)  Wt 160 lb (72.576 kg)  BMI 31.25 kg/m2  Physical Exam  Nursing note and vitals reviewed. Constitutional: She is oriented to person, place, and time. She appears well-developed and well-nourished.       Patient breathing oxygen, sitting up at 90, no distress in that position.  HENT:  Head: Normocephalic and atraumatic.  Right Ear: External ear normal.  Left Ear: External ear normal.  Mouth/Throat: Oropharynx is clear and moist.  Eyes: Conjunctivae and EOM are normal. Pupils are equal, round, and reactive to light.  Neck: Normal range of motion. Neck supple.  Cardiovascular: Normal rate and normal heart sounds.        And regular rhythm.  Pulmonary/Chest:       Bibasilar rales.  Abdominal: Soft. Bowel sounds are normal.  Musculoskeletal: Normal range of motion.       3+ edema, of both feet extending up to both mid calves.  Lymphadenopathy:    She has no cervical adenopathy.  Neurological: She is alert and oriented to person, place, and time.       No sensory or motor deficit.  Skin: Skin is warm and dry.  Psychiatric: She has a normal mood and affect. Her behavior is normal.    ED Course  Procedures (including critical care time)   Labs Reviewed  CBC WITH DIFFERENTIAL  BASIC METABOLIC PANEL  TROPONIN I   1:23 PM  Date: 03/10/2012  Rate: 97  Rhythm: atrial fibrillation and premature ventricular contractions (PVC)  QRS Axis: right  Intervals: normal QRS:  Poor R wave progression in the precordial leads suggests possible old anterior myocardial infarction.  ST/T Wave abnormalities: ST depressions laterally  Conduction Disutrbances:none  Narrative Interpretation: Abnormal EKG  Old EKG Reviewed: unchanged   3:41 PM Patient was seen and had physical examination.  She was given intravenous Lasix 6 and topical nitroglycerin, and feels better after diuresing some. Laboratory tests suggested congestive heart failure, as did physical exam. I recommended that patient be admitted for overnight observation for exacerbation of congestive heart failure.  3:54 PM Case discussed with Dr. Ouida Sills, who accepts pt for admission to a telemetry floor.   1. Congestive heart failure   2. COPD (chronic obstructive pulmonary disease)   3. Atrial fibrillation        Carleene Cooper III, MD 03/10/12 1555  Carleene Cooper III, MD 03/10/12 [phone]

## 2012-03-10 NOTE — ED Notes (Signed)
Pt c/o chest heaviness today with sob/wheezing.

## 2012-03-10 NOTE — ED Notes (Signed)
Dr. Ouida Sills to be contacted for this patient.

## 2012-03-10 NOTE — ED Notes (Signed)
Patient ambulated to restroom with assistance from family and staff. Patient placed back in bed and placed back on ER monitor. No needs voiced at this time.

## 2012-03-11 LAB — BASIC METABOLIC PANEL
Calcium: 9.2 mg/dL (ref 8.4–10.5)
Creatinine, Ser: 0.87 mg/dL (ref 0.50–1.10)
GFR calc non Af Amer: 60 mL/min — ABNORMAL LOW (ref >90)
Sodium: 145 mEq/L (ref 135–145)

## 2012-03-11 LAB — PROTIME-INR
INR: 1.93 — ABNORMAL HIGH (ref 0.00–1.49)
Prothrombin Time: 22.4 seconds — ABNORMAL HIGH (ref 11.6–15.2)

## 2012-03-11 MED ORDER — POTASSIUM CHLORIDE CRYS ER 20 MEQ PO TBCR
20.0000 meq | EXTENDED_RELEASE_TABLET | Freq: Three times a day (TID) | ORAL | Status: DC
  Administered 2012-03-11 – 2012-03-12 (×4): 20 meq via ORAL
  Filled 2012-03-11 (×4): qty 1

## 2012-03-11 MED ORDER — METHYLPREDNISOLONE SODIUM SUCC 125 MG IJ SOLR
80.0000 mg | Freq: Three times a day (TID) | INTRAMUSCULAR | Status: DC
  Administered 2012-03-11 – 2012-03-12 (×3): 80 mg via INTRAVENOUS
  Filled 2012-03-11 (×3): qty 2

## 2012-03-11 MED ORDER — FUROSEMIDE 10 MG/ML IJ SOLN
40.0000 mg | Freq: Two times a day (BID) | INTRAMUSCULAR | Status: DC
  Administered 2012-03-11 – 2012-03-12 (×3): 40 mg via INTRAVENOUS
  Filled 2012-03-11 (×3): qty 4

## 2012-03-11 NOTE — Progress Notes (Signed)
UR Chart Review Completed  

## 2012-03-11 NOTE — Progress Notes (Signed)
NAMEDAZIYAH, COGAN NO.:  [id number]  MEDICAL RECORD NO.:  [id number]  LOCATION:                                 FACILITY:  PHYSICIAN:  Kingsley Callander. Ouida Sills, MD       DATE OF BIRTH:  [birth year]  DATE OF PROCEDURE:  03/11/2012 DATE OF DISCHARGE:                                PROGRESS NOTE   HISTORY OF PRESENT ILLNESS:  Mrs. Chipley is feeling much better.  She has diuresed overnight.  Her Is and Os are recorded at a +1020.  She is able to sleep much better last night without orthopnea or PND.  PHYSICAL EXAMINATION:  VITAL SIGNS:  Temperature is 98, pulse 92, respirations 18, blood pressure 152/74, oxygen saturation 96% on supplemental oxygen. LUNGS:  Basilar rales.  Wheezing has resolved. HEART:  Irregularly irregular. EXTREMITIES:  Decreased edema.  IMPRESSION/PLAN: 1. Congestive heart failure.  Continue diuresis.  We will modify her     Lasix to every 12 hours. 2. Chronic obstructive pulmonary disease.  Continue nebulizers.     Decrease Solu-Medrol. 3. Chronic atrial fibrillation.  INR was 2.53 yesterday and she     appears to have another INR done which was 2.22.  She is now down     to 1.93.  Continue Coumadin.     Kingsley Callander. Ouida Sills, MD     ROF/MEDQ  D:  03/11/2012  T:  03/11/2012  Job:  [id number]

## 2012-03-11 NOTE — Care Management Note (Unsigned)
    Page 1 of 1   03/11/2012     1:22:18 PM   CARE MANAGEMENT NOTE 03/11/2012  Patient:  [name], [name]   Account Number:  [id number]  Date Initiated:  03/11/2012  Documentation initiated by:  Sharrie Rothman  Subjective/Objective Assessment:   Pt admitted from home with CHF. Pt lives with husband and will return home at discharge. Pt has O2, lift chair, cane, walker, life line for home use.     Action/Plan:   Pt is agreeable to [facility] if MD agrees. CM will arrange prior to discharge with [facility] of pts choosing.   Anticipated DC Date:  03/14/2012   Anticipated DC Plan:  HOME W HOME HEALTH SERVICES      DC Planning Services  CM consult      Choice offered to / List presented to:             Status of service:  In process, will continue to follow Medicare Important Message given?   (If response is "NO", the following Medicare IM given date fields will be blank) Date Medicare IM given:   Date Additional Medicare IM given:    Discharge Disposition:    Per UR Regulation:    If discussed at Long Length of Stay Meetings, dates discussed:    Comments:  03/11/12 1321 Arlyss Queen, RN BSN CM

## 2012-03-11 NOTE — Clinical Documentation Improvement (Signed)
CHF DOCUMENTATION CLARIFICATION QUERY  THIS DOCUMENT IS NOT A PERMANENT PART OF THE MEDICAL RECORD  TO RESPOND TO THE THIS QUERY, FOLLOW THE INSTRUCTIONS BELOW:  1. If needed, update documentation for the patient's encounter via the notes activity.  2. Access this query again and click edit on the In [employer].  3. After updating, or not, click F2 to complete all highlighted (required) fields concerning your review. Select "additional documentation in the medical record" OR "no additional documentation provided".  4. Click Sign note button.  5. The deficiency will fall out of your In Basket *Please let us know if you are not able to complete this workflow by phone or e-mail (listed below).  Please update your documentation within the medical record to reflect your response to this query.                                                                                    03/11/12  Dear [facility]. [facility],  In a better effort to capture your patient's severity of illness, reflect appropriate length of stay and utilization of resources, a review of the patient medical record has revealed the following indicators the diagnosis of Heart Failure.  Based on your clinical judgment, please clarify and document in a progress note and/or discharge summary the clinical condition associated with the following supporting information.  In responding to this query please exercise your independent judgment.  The fact that a query is asked, does not imply that any particular answer is desired or expected.  Possible Clinical Conditions?  Chronic Systolic Congestive Heart Failure Chronic Diastolic Congestive Heart Failure Chronic Systolic & Diastolic Congestive Heart Failure Acute Systolic Congestive Heart Failure Acute Diastolic Congestive Heart Failure Acute Systolic & Diastolic Congestive Heart Failure Acute on Chronic Systolic Congestive Heart Failure Acute on Chronic Diastolic Congestive Heart  Failure Acute on Chronic Systolic & Diastolic  Congestive Heart Failure Other Condition________________________________________ Cannot Clinically Determine  Clinical Information:  Risk Factors: History of CHF Admitting diagnosis = CHF exacerbation  Signs & Symptoms: 1+ peripheral edema cardiomegaly dyspnea Irreg irreg heart beat Diagnostics: Lab: BNP: 1012  Echo results: EF: 03/30/11=50-55%  EKG: Chronic A Fib  Radiology: 8/22 CXR IMPRESSION: 1. Cardiomegaly with chronic mild right atrial prominence. 2. Chronic mild interstitial accentuation, with stable scarring at the right lung base  Treatment: Monitor Daily weights and Qshift I & O Diuretics: Lasix 80mg  IV initially IV Lasix 40mg  bid  Reviewed: No response from MD/Coder queried Thank You,  Harless Litten RN, MSN Clinical Documentation Specialist: Office# [phone] [facility] Health Information Management [place]

## 2012-03-11 NOTE — H&P (Signed)
[name], [name]:  [id number]  MEDICAL RECORD NO.:  [id number]  LOCATION:                                 FACILITY:  PHYSICIAN:  Kingsley Callander. Ouida Sills, MD       DATE OF BIRTH:  08/26/1926  DATE OF ADMISSION:  03/10/2012 DATE OF DISCHARGE:  LH                             HISTORY & PHYSICAL   CHIEF COMPLAINT:  Difficulty breathing.  HISTORY OF PRESENT ILLNESS:  This patient is an 78 year old white female who presented to the emergency room with increasing shortness of breath over the past 3 days.  She has had wheezing.  She has had orthopnea and PND.  She has had swelling in her legs.  She is on oxygen at home.  She is chronically on Lasix for heart failure.  She also has COPD and uses albuterol as needed.  She has chronic atrial fib and is anticoagulated with Coumadin.  She is followed by Dr. Shelle Iron with [facility]. She has a prior history of respiratory failure.  PAST MEDICAL HISTORY: 1. CHF. 2. COPD. 3. Chronic atrial fibrillation. 4. Coronary artery disease status post LAD stent in 1998. 5. Hypertension. 6. Hyperlipidemia. 7. History of rhabdomyolysis with Baycol. 8. Hyperparathyroidism status post parathyroidectomy. 9. Hysterectomy. 10.Nephrolithiasis with 2 pyelolithotomies. 11.Status post left hip fracture repair.  MEDICATIONS: 1. Digoxin 0.125 mg daily. 2. Lasix 60 mg b.i.d. 3. Albuterol metered-dose inhaler 2 puffs p.r.n. 4. Cardizem CD 240 mg daily. 5. Vicodin daily p.r.n. 6. Dulera 2 puffs b.i.d. 7. Potassium 20 mEq b.i.d. 8. Coumadin 7.5 mg daily with 5 mg on Friday.  ALLERGIES:  BAYCOL, SHRIMP, TOPROL-XL, IOHEXOL, and MACROBID.  SOCIAL HISTORY:  She does not smoke, drink, or use drugs.  FAMILY HISTORY:  Her father had COPD and died of an MI.  Mother had CHF.  REVIEW OF SYSTEMS:  No chest pain, syncope, fever, purulent sputum production, or difficulty voiding.  PHYSICAL EXAMINATION:  VITAL SIGNS:  Temperature 98.8, pulse  92, respirations 22, blood pressure 159/62, oxygen saturation 95%. GENERAL:  Alert and mildly dyspneic initially, although she reportedly looks much better now than she did on presentation.  She felt much better after receiving her initial IV Lasix dose in the ER. HEENT:  No scleral icterus.  Nose and oropharynx are unremarkable. NECK:  No JVD or thyromegaly. LUNGS:  Bilateral wheezes. HEART:  Irregularly irregular with no audible murmurs. ABDOMEN:  Soft and nontender with no hepatosplenomegaly. EXTREMITIES:  1+ edema with skin thickening and flaking skin in the lower legs and feet. NEURO:  No focal weakness.  She is alert and oriented. LYMPH NODES:  No cervical or supraclavicular enlargement.  LABORATORY DATA:  Troponin-I less than 0.30.  BNP 1012.  Sodium 138, potassium 3.5, bicarb 31, BUN and creatinine 19 and 0.91, glucose 107. White count 8.8, hemoglobin 13.9, platelets 111,000.  INR 2.53.  Digoxin level 1.0.  Chest x-ray reveals cardiomegaly with mild chronic interstitial accentuation.  There is stable scarring in the right base.  IMPRESSION/PLAN: 1. Congestive heart failure.  She has improved initially with 80 mg of     IV Lasix.  We will continue IV Lasix. 2. She is  currently wheezy.  She has used albuterol earlier.  Today we     will continue albuterol nebulizers and we will add IV Solu-Medrol.     She has no evidence of pneumonia at this point. 3. Chronic atrial fibrillation.  Her rate is well controlled with     digoxin and diltiazem.  These will be continued.  Coumadin will be     continued.  She is therapeutically anticoagulated. 4. Coronary artery disease, stable.     Kingsley Callander. Ouida Sills, MD     ROF/MEDQ  D:  03/11/2012  T:  03/11/2012  Job:  [id number]

## 2012-03-12 LAB — PROTIME-INR: INR: 2.83 — ABNORMAL HIGH (ref 0.00–1.49)

## 2012-03-12 LAB — BASIC METABOLIC PANEL
CO2: 29 mEq/L (ref 19–32)
GFR calc non Af Amer: 51 mL/min — ABNORMAL LOW (ref >90)
Glucose, Bld: 149 mg/dL — ABNORMAL HIGH (ref 70–99)
Potassium: 4.9 mEq/L (ref 3.5–5.1)
Sodium: 140 mEq/L (ref 135–145)

## 2012-03-12 MED ORDER — PREDNISONE 20 MG PO TABS: ORAL_TABLET | ORAL | Status: AC

## 2012-03-12 NOTE — Discharge Summary (Signed)
[name], CLOSSER NO.:  [id number]  MEDICAL RECORD NO.:  [id number]  LOCATION:  A325                          FACILITY:  [facility]  PHYSICIAN:  Melvyn Novas, MDDATE OF BIRTH:  04-Oct-1926  DATE OF ADMISSION:  03/10/2012 DATE OF DISCHARGE:  LH                              DISCHARGE SUMMARY   The patient is an 78 year old white female, patient of Dr. Ouida Sills who was admitted with a combination of chronic atrial fibrillation with some exacerbation of congestive heart failure, some exacerbation of asthmatic bronchitis.  She was vigorously diuresed, placed on IV Solu-Medrol, given nebulizer therapy, and seemed to respond over 48-hour.  She had no dyspnea, orthopnea, PND, or cough.  Chest x-ray was essentially clear. The patient denied angina, anginal equivalents.  Blood pressure was 145/75, temperature 97.9, pulse is 92 and regular, respiratory rate is 18.  Creatinine 0.99, potassium 4.9.  INR was 2.83, on Coumadin. Hemoglobin 13.9.  Chest x-ray just revealed some chronic mild interstitial accentuation and no definite infiltrate.  The patient was diuresed and given Solu-Medrol, had no further symptomatology from a cardiopulmonary perspective, was subsequently discharged on the following medicines: 1. Prednisone 20 mg per day p.o. daily for 8 additional days. 2. Albuterol inhaler 2 puffs q.i.d. 3. Digoxin 0.125 mg p.o. daily. 4. Diltiazem 240 p.o. daily. 5. Lasix 60 mg p.o. b.i.d. 6. Hydrocodone 5/500 one tablet daily p.r.n. 7. Formoterol inhaler 100/5 two puffs q.12 h. 8. Potassium chloride 20 mEq p.o. b.i.d. 9. Coumadin 7.5 mg 6 days a week and 5 mg 1 day a week.  She is to follow up with Dr. Ouida Sills within 4-5 days' time post discharge. Check a PT/INR and respiratory status.     Melvyn Novas, MD     RMD/MEDQ  D:  03/12/2012  T:  03/12/2012  Job:  [id number]

## 2012-03-12 NOTE — Progress Notes (Signed)
D/c instructions reviewed with patient, husband, and daughter.   Verbalized understanding.  Pt dc'd to home with family.  Schonewitz, Candelaria Stagers 03/12/2012

## 2012-03-12 NOTE — Discharge Summary (Signed)
[id number] 

## 2012-03-24 ENCOUNTER — Ambulatory Visit (INDEPENDENT_AMBULATORY_CARE_PROVIDER_SITE_OTHER): Payer: Medicare Other | Admitting: *Deleted

## 2012-03-24 DIAGNOSIS — Z7901 Long term (current) use of anticoagulants: Secondary | ICD-10-CM

## 2012-03-24 DIAGNOSIS — I482 Chronic atrial fibrillation, unspecified: Secondary | ICD-10-CM

## 2012-03-24 DIAGNOSIS — I4891 Unspecified atrial fibrillation: Secondary | ICD-10-CM

## 2012-03-24 LAB — POCT INR: INR: 1.6

## 2012-03-31 ENCOUNTER — Telehealth: Payer: Self-pay | Admitting: *Deleted

## 2012-03-31 ENCOUNTER — Other Ambulatory Visit: Payer: Self-pay | Admitting: Cardiology

## 2012-03-31 MED ORDER — POTASSIUM CHLORIDE CRYS ER 20 MEQ PO TBCR: 20.0000 meq | EXTENDED_RELEASE_TABLET | Freq: Two times a day (BID) | ORAL | Status: DC

## 2012-03-31 NOTE — Telephone Encounter (Signed)
Pt is to call for appt

## 2012-04-06 ENCOUNTER — Ambulatory Visit (INDEPENDENT_AMBULATORY_CARE_PROVIDER_SITE_OTHER): Payer: Medicare Other | Admitting: *Deleted

## 2012-04-06 DIAGNOSIS — Z7901 Long term (current) use of anticoagulants: Secondary | ICD-10-CM

## 2012-04-06 DIAGNOSIS — I482 Chronic atrial fibrillation, unspecified: Secondary | ICD-10-CM

## 2012-04-06 DIAGNOSIS — I4891 Unspecified atrial fibrillation: Secondary | ICD-10-CM

## 2012-04-06 LAB — POCT INR: INR: 3.4

## 2012-04-27 ENCOUNTER — Ambulatory Visit (INDEPENDENT_AMBULATORY_CARE_PROVIDER_SITE_OTHER): Payer: Medicare Other | Admitting: *Deleted

## 2012-04-27 DIAGNOSIS — Z7901 Long term (current) use of anticoagulants: Secondary | ICD-10-CM

## 2012-04-27 DIAGNOSIS — I4891 Unspecified atrial fibrillation: Secondary | ICD-10-CM

## 2012-04-27 DIAGNOSIS — I482 Chronic atrial fibrillation, unspecified: Secondary | ICD-10-CM

## 2012-04-27 LAB — POCT INR: INR: 1.6

## 2012-04-28 ENCOUNTER — Other Ambulatory Visit: Payer: Self-pay | Admitting: Cardiology

## 2012-05-11 ENCOUNTER — Ambulatory Visit (INDEPENDENT_AMBULATORY_CARE_PROVIDER_SITE_OTHER): Payer: Medicare Other | Admitting: *Deleted

## 2012-05-11 DIAGNOSIS — I482 Chronic atrial fibrillation, unspecified: Secondary | ICD-10-CM

## 2012-05-11 DIAGNOSIS — Z7901 Long term (current) use of anticoagulants: Secondary | ICD-10-CM

## 2012-05-11 DIAGNOSIS — I4891 Unspecified atrial fibrillation: Secondary | ICD-10-CM

## 2012-05-11 LAB — POCT INR: INR: 4.1

## 2012-05-30 ENCOUNTER — Ambulatory Visit (INDEPENDENT_AMBULATORY_CARE_PROVIDER_SITE_OTHER): Payer: Medicare Other | Admitting: *Deleted

## 2012-05-30 DIAGNOSIS — I482 Chronic atrial fibrillation, unspecified: Secondary | ICD-10-CM

## 2012-05-30 DIAGNOSIS — Z7901 Long term (current) use of anticoagulants: Secondary | ICD-10-CM

## 2012-05-30 DIAGNOSIS — I4891 Unspecified atrial fibrillation: Secondary | ICD-10-CM

## 2012-06-15 ENCOUNTER — Ambulatory Visit (INDEPENDENT_AMBULATORY_CARE_PROVIDER_SITE_OTHER): Payer: Medicare Other | Admitting: *Deleted

## 2012-06-15 DIAGNOSIS — Z7901 Long term (current) use of anticoagulants: Secondary | ICD-10-CM

## 2012-06-15 DIAGNOSIS — I4891 Unspecified atrial fibrillation: Secondary | ICD-10-CM

## 2012-06-15 DIAGNOSIS — I482 Chronic atrial fibrillation, unspecified: Secondary | ICD-10-CM

## 2012-06-20 ENCOUNTER — Encounter: Payer: Self-pay | Admitting: Pulmonary Disease

## 2012-06-20 ENCOUNTER — Ambulatory Visit (INDEPENDENT_AMBULATORY_CARE_PROVIDER_SITE_OTHER): Payer: Medicare Other | Admitting: Pulmonary Disease

## 2012-06-20 VITALS — BP 110/72 | HR 71 | Temp 97.0°F | Ht 60.0 in | Wt 159.8 lb

## 2012-06-20 DIAGNOSIS — J4489 Other specified chronic obstructive pulmonary disease: Secondary | ICD-10-CM

## 2012-06-20 DIAGNOSIS — J449 Chronic obstructive pulmonary disease, unspecified: Secondary | ICD-10-CM

## 2012-06-20 MED ORDER — MOMETASONE FURO-FORMOTEROL FUM 100-5 MCG/ACT IN AERO: 2.0000 | INHALATION_SPRAY | Freq: Two times a day (BID) | RESPIRATORY_TRACT | Status: DC

## 2012-06-20 NOTE — Patient Instructions (Addendum)
Stay on DULERA 100/5  2 puffs am and pm EVERYDAY, no matter how you feel.  Rinse mouth well after using. Can use albuterol for rescue, only when having a difficult time. followup with me in 6mos.

## 2012-06-20 NOTE — Progress Notes (Signed)
  Subjective:    Patient ID: [name], female    DOB: April 15, 1927, 78 y.o.   MRN: [id number]  HPI The patient comes in today for followup of her known moderate to severe COPD.  She's been taking her dulera as needed rather than b.i.d., and I have counseled her on this.  She apparently has had a few episodes of bronchitis since the last visit, and just recently finished a round of antibiotics.  Hopefully, if she stays on the dulera regularly, we can avoid this.  She feels that her breathing is at her usual baseline, and denies any significant chest congestion at this time.   Review of Systems  Constitutional: Negative for fever and unexpected weight change.  HENT: Positive for nosebleeds, congestion, rhinorrhea and postnasal drip. Negative for ear pain, sore throat, sneezing, trouble swallowing, dental problem and sinus pressure.   Eyes: Negative for redness and itching.  Respiratory: Negative for cough, chest tightness, shortness of breath and wheezing.   Cardiovascular: Positive for leg swelling. Negative for palpitations.  Gastrointestinal: Negative for nausea and vomiting.  Genitourinary: Negative for dysuria.  Musculoskeletal: Positive for joint swelling.  Skin: Negative for rash.  Neurological: Negative for headaches.  Hematological: Bruises/bleeds easily.  Psychiatric/Behavioral: Negative for dysphoric mood. The patient is not nervous/anxious.        Objective:   Physical Exam Overweight female in no acute distress Nose without purulent discharge noted Neck without lymphadenopathy or thyromegaly Chest with mild decrease in breath sounds, no wheezes Cardiac exam with mild irregularity but controlled ventricular response Lower extremities with 1+ edema bilaterally, no cyanosis Alert and oriented, moves all 4 extremities.       Assessment & Plan:

## 2012-06-20 NOTE — Assessment & Plan Note (Signed)
The patient overall has done well since the last visit, but has had a few episodes of what sounds like acute bronchitis.  She has just recently finished a round of antibiotics.  It turns out, the patient is only using her dulera as needed instead of twice a day for maintenance.  I have reviewed this with her again, and she will try to stay on it regularly.  I have also asked her to try and stay as active as possible.

## 2012-06-20 NOTE — Addendum Note (Signed)
Addended by: Alecia Lemming on: 06/20/2012 09:46 AM   Modules accepted: Orders

## 2012-07-07 ENCOUNTER — Ambulatory Visit (INDEPENDENT_AMBULATORY_CARE_PROVIDER_SITE_OTHER): Payer: Medicare Other | Admitting: *Deleted

## 2012-07-07 DIAGNOSIS — Z7901 Long term (current) use of anticoagulants: Secondary | ICD-10-CM

## 2012-07-07 DIAGNOSIS — I4891 Unspecified atrial fibrillation: Secondary | ICD-10-CM

## 2012-07-07 DIAGNOSIS — I482 Chronic atrial fibrillation, unspecified: Secondary | ICD-10-CM

## 2012-07-07 LAB — POCT INR: INR: 2.4

## 2012-08-04 ENCOUNTER — Ambulatory Visit (INDEPENDENT_AMBULATORY_CARE_PROVIDER_SITE_OTHER): Payer: Medicare Other | Admitting: *Deleted

## 2012-08-04 DIAGNOSIS — I4891 Unspecified atrial fibrillation: Secondary | ICD-10-CM

## 2012-08-04 DIAGNOSIS — Z7901 Long term (current) use of anticoagulants: Secondary | ICD-10-CM

## 2012-08-04 DIAGNOSIS — I482 Chronic atrial fibrillation, unspecified: Secondary | ICD-10-CM

## 2012-08-11 ENCOUNTER — Telehealth: Payer: Self-pay | Admitting: *Deleted

## 2012-08-11 NOTE — Telephone Encounter (Signed)
Patient has started antibiotic.Marland Kitchen Patient is on Cefuroxineaxetil and also on Prednisone.  Please call with instructions. / tgs

## 2012-08-11 NOTE — Telephone Encounter (Signed)
Pt saw Dr Ouida Sills 1/17.  He started her on ceftin bid x 7 days and prednisone 10mg  taper (40mg  x 3, 30mg  x 3 10mg  x 3 5mg  x 3)  She finishes ceftin 1/24 and Prednisone 1/29.  Pt has taken coumadin today.  Told her to hold coumadin tomorrow, take 1/2 tablet 1/25, take 1 tablet 1/26 and have INR checked at Dr Dietrich Pates appt 1/27.  She verbalized understanding.  Pt to come to ED for s/s of bleeding.

## 2012-08-15 ENCOUNTER — Ambulatory Visit (INDEPENDENT_AMBULATORY_CARE_PROVIDER_SITE_OTHER): Payer: Medicare Other | Admitting: Cardiology

## 2012-08-15 ENCOUNTER — Encounter: Payer: Self-pay | Admitting: Cardiology

## 2012-08-15 ENCOUNTER — Ambulatory Visit (INDEPENDENT_AMBULATORY_CARE_PROVIDER_SITE_OTHER): Payer: Medicare Other | Admitting: *Deleted

## 2012-08-15 VITALS — BP 149/76 | HR 80 | Ht 60.0 in | Wt 160.0 lb

## 2012-08-15 DIAGNOSIS — M109 Gout, unspecified: Secondary | ICD-10-CM

## 2012-08-15 DIAGNOSIS — Z7901 Long term (current) use of anticoagulants: Secondary | ICD-10-CM

## 2012-08-15 DIAGNOSIS — I4891 Unspecified atrial fibrillation: Secondary | ICD-10-CM

## 2012-08-15 DIAGNOSIS — I10 Essential (primary) hypertension: Secondary | ICD-10-CM

## 2012-08-15 DIAGNOSIS — I482 Chronic atrial fibrillation, unspecified: Secondary | ICD-10-CM

## 2012-08-15 DIAGNOSIS — D696 Thrombocytopenia, unspecified: Secondary | ICD-10-CM

## 2012-08-15 MED ORDER — RIVAROXABAN 20 MG PO TABS: 20.0000 mg | ORAL_TABLET | Freq: Every day | ORAL | Status: DC

## 2012-08-15 NOTE — Progress Notes (Deleted)
Name: [name]    DOB: 1927-05-26  Age: 79 y.o.  MR#: [id number]       PCP:  Carylon Perches, MD      Insurance: @PAYORNAME @   CC:   No chief complaint on file.  MEDICATION LIST AND CLARIFIED WITH PHARMACY RECENTLY DX WITH COPD PER DR CLANCE O2 @ 2 LITERS   VS BP 149/76  Pulse 80  Ht 5' (1.524 m)  Wt 160 lb (72.576 kg)  BMI 31.25 kg/m2  SpO2 97%  Weights Current Weight  08/15/12 160 lb (72.576 kg)  06/20/12 159 lb 12.8 oz (72.485 kg)  03/12/12 162 lb 0.6 oz (73.5 kg)    Blood Pressure  BP Readings from Last 3 Encounters:  08/15/12 149/76  06/20/12 110/72  03/12/12 145/74     Admit date:  (Not on file) Last encounter with RMR:  04/28/2012   Allergy Allergies  Allergen Reactions  . Baycol (Cerivastatin Sodium) Other (See Comments)    Rhabdomyolysis   . Iohexol      Desc: PT GOT VERY SOB DURING CARDIAC CATH IN 1998/ STATES THEY HAD TO STOP DOING THE CATH DUE TO THIS/ ALSO PRODUCED SEVERE HEADACHE FOR WEEKS/ PT REFUSING FURTHER CONTRAST/ TSF   . Shrimp (Shellfish Allergy) Other (See Comments)    gout  . Statins   . Toprol Xl (Metoprolol Succinate)     Cough and extreme SOB    Current Outpatient Prescriptions  Medication Sig Dispense Refill  . albuterol (PROVENTIL HFA;VENTOLIN HFA) 108 (90 BASE) MCG/ACT inhaler Inhale 2 puffs into the lungs every 6 (six) hours as needed for wheezing.  1 Inhaler  6  . carboxymethylcellulose (REFRESH PLUS) 0.5 % SOLN 1 drop as needed.      . cefUROXime (CEFTIN) 250 MG tablet Take 250 mg by mouth 2 (two) times daily.       . digoxin (LANOXIN) 0.125 MG tablet TAKE ONE (1) TABLET BY MOUTH EVERY      DAY  15 tablet  0  . diltiazem (CARDIZEM CD) 240 MG 24 hr capsule Take 1 tablet by mouth daily.      . furosemide (LASIX) 40 MG tablet Take 60 mg by mouth 2 (two) times daily.      Marland Kitchen HYDROcodone-acetaminophen (VICODIN) 5-500 MG per tablet Take 1 tablet by mouth daily.       Marland Kitchen KLOR-CON M20 20 MEQ tablet TAKE ONE TABLET TWICE DAILY  60 tablet   11  . mometasone-formoterol (DULERA) 100-5 MCG/ACT AERO Inhale 2 puffs into the lungs 2 (two) times daily.  1 Inhaler  6  . predniSONE (DELTASONE) 10 MG tablet Take 10 mg by mouth daily.       Marland Kitchen warfarin (COUMADIN) 5 MG tablet Take 5-7.5 mg by mouth daily. Patient takes 1&1/2 tablet(7.5mg ) daily and on Friday patient takes 1 tablet(5mg )        Discontinued Meds:    Medications Discontinued During This Encounter  Medication Reason  . torsemide (DEMADEX) 100 MG tablet     Patient Active Problem List  Diagnosis  . GOUT  . THROMBOCYTOPENIA  . NEPHROLITHIASIS  . Chronic anticoagulation  . Pulmonary hypertension  . Hypertension  . Arteriosclerotic cardiovascular disease (ASCVD)  . Chronic atrial fibrillation  . Hyperlipidemia  . Hyperparathyroidism  . Degenerative joint disease  . COPD (chronic obstructive pulmonary disease)    LABS Anti-coag visit on 08/04/2012  Component Date Value  . INR 08/15/2012 1.2   Anti-coag visit on 07/07/2012  Component Date Value  .  INR 08/04/2012 2.9   Anti-coag visit on 06/15/2012  Component Date Value  . INR 07/07/2012 2.4   Anti-coag visit on 05/30/2012  Component Date Value  . INR 06/15/2012 3.0      Results for this Opt Visit:     Results for orders placed in visit on 08/04/12  POCT INR      Component Value Range   INR 1.2      EKG Orders placed during the hospital encounter of 03/10/12  . EKG     Prior Assessment and Plan Problem List as of 08/15/2012            Cardiology Problems   Pulmonary hypertension   Last Assessment & Plan Note   12/16/2011 Office Visit Signed 12/16/2011 12:23 PM by Kathlen Brunswick, MD    Functional status is relatively good at present.  Pulmonary hypertension unlikely to be primary.  The benefit of an extensive evaluation and high cost therapy, such as prostaglandin infusion, is uncertain.  We will continue basic therapy, which has proven effective to date.    Hypertension   Last Assessment &  Plan Note   04/22/2011 Office Visit Signed 04/23/2011  6:42 PM by Kathlen Brunswick, MD    Blood pressure control is excellent; current medication will be continued.    Arteriosclerotic cardiovascular disease (ASCVD)   Last Assessment & Plan Note   04/22/2011 Office Visit Addendum 04/26/2011 10:51 PM by Kathlen Brunswick, MD    Patient has had a remarkably stable course since percutaneous intervention for single vessel disease 14 years ago.  Our approach will continue to be optimal control of cardiovascular risk factors in an attempt to prevent recurrent symptomatic coronary disease.    Chronic atrial fibrillation   Last Assessment & Plan Note   12/16/2011 Office Visit Signed 12/16/2011 12:29 PM by Kathlen Brunswick, MD    Heart rate is well controlled in atrial fibrillation.  Current medication will be continued.    Hyperlipidemia   Last Assessment & Plan Note   04/22/2011 Office Visit Signed 04/23/2011  6:41 PM by Kathlen Brunswick, MD    No recent lipid testing is available to me.  A lipid profile will be obtained the next time diagnostic phlebotomy is performed.      Other   GOUT   THROMBOCYTOPENIA   Last Assessment & Plan Note   04/22/2011 Office Visit Signed 04/23/2011  6:43 PM by Kathlen Brunswick, MD    Platelets 104-115,000 over the past year.  Thrombocytopenia at this mild level probably did not contribute to her hematoma.  Continued observation is appropriate.    NEPHROLITHIASIS   Chronic anticoagulation   Last Assessment & Plan Note   12/16/2011 Office Visit Addendum 12/21/2011  7:31 AM by Kathlen Brunswick, MD    CBC normal in 06/2011; stool Hemoccult testing is pending.  Patient does not believe she has undergone colonoscopy within the last 10 years.  Polypectomy may have been required in the previous study.  The patient will discuss the advisability of colonoscopy with her PCP, Dr. Ouida Sills.    Hyperparathyroidism   Degenerative joint disease   COPD (chronic obstructive pulmonary  disease)   Last Assessment & Plan Note   06/20/2012 Office Visit Signed 06/20/2012  9:40 AM by Barbaraann Share, MD    The patient overall has done well since the last visit, but has had a few episodes of what sounds like acute bronchitis.  She has just recently finished  a round of antibiotics.  It turns out, the patient is only using her dulera as needed instead of twice a day for maintenance.  I have reviewed this with her again, and she will try to stay on it regularly.  I have also asked her to try and stay as active as possible.        Imaging: No results found.   FRS Calculation: Score not calculated

## 2012-08-15 NOTE — Patient Instructions (Addendum)
Your physician recommends that you schedule a follow-up appointment in:  1 - 11 months with [facility] 2 - 6 months with Misty Stanley  Your physician has recommended you make the following change in your medication:  1 - STOP Coumadin 2 - START Xarelto tonight 20 mg each evening with your evening meal  Your physician recommends that you return for lab work in: Within the next week

## 2012-08-16 LAB — CBC
MCH: 31.5 pg (ref 26.0–34.0)
MCHC: 33.9 g/dL (ref 30.0–36.0)
Platelets: 121 10*3/uL — ABNORMAL LOW (ref 150–400)

## 2012-08-16 NOTE — Progress Notes (Signed)
Patient ID: [name], female   DOB: 09/13/1926, 79 y.o.   MRN: [id number]  HPI: Scheduled return visit for this nice woman with hypertension, coronary artery disease, atrial fibrillation and COPD in the absence of significant prior tobacco use. She reports a recent exacerbation of pulmonary symptoms treated effectively on an outpatient basis by Dr. Ouida Sills. Respiratory symptoms are now returning to baseline. She has been maintained on warfarin with stable and therapeutic anticoagulation until her most recent assessment at which time INR was 1.2. Change in sensitivities to warfarin is likely related to one of the more medications recently added to her regime.  Prior to Admission medications   Medication Sig Start Date End Date Taking? Authorizing Provider  albuterol (PROVENTIL HFA;VENTOLIN HFA) 108 (90 BASE) MCG/ACT inhaler Inhale 2 puffs into the lungs every 6 (six) hours as needed for wheezing. 12/18/11 12/17/12 Yes Barbaraann Share, MD  carboxymethylcellulose (REFRESH PLUS) 0.5 % SOLN 1 drop as needed.   Yes Historical Provider, MD  cefUROXime (CEFTIN) 250 MG tablet Take 250 mg by mouth 2 (two) times daily.  08/05/12  Yes Historical Provider, MD  digoxin (LANOXIN) 0.125 MG tablet TAKE ONE (1) TABLET BY MOUTH EVERY      DAY 02/12/11  Yes Kathlen Brunswick, MD  diltiazem (CARDIZEM CD) 240 MG 24 hr capsule Take 1 tablet by mouth daily. 03/18/11  Yes Historical Provider, MD  furosemide (LASIX) 40 MG tablet Take 60 mg by mouth 2 (two) times daily. 12/16/11  Yes Kathlen Brunswick, MD  HYDROcodone-acetaminophen (VICODIN) 5-500 MG per tablet Take 1 tablet by mouth daily.  01/28/11  Yes Historical Provider, MD  KLOR-CON M20 20 MEQ tablet TAKE ONE TABLET TWICE DAILY 04/28/12  Yes Kathlen Brunswick, MD  mometasone-formoterol [facility]) 100-5 MCG/ACT AERO Inhale 2 puffs into the lungs 2 (two) times daily. 06/20/12  Yes Barbaraann Share, MD  predniSONE (DELTASONE) 10 MG tablet Take 10 mg by mouth daily.  08/05/12 08/22/12 Yes  Historical Provider, MD  Rivaroxaban (XARELTO) 20 MG TABS Take 1 tablet (20 mg total) by mouth daily. 08/15/12   Kathlen Brunswick, MD    Allergies  Allergen Reactions  . Baycol (Cerivastatin Sodium) Other (See Comments)    Rhabdomyolysis   . Iohexol      Desc: PT GOT VERY SOB DURING CARDIAC CATH IN 1998/ STATES THEY HAD TO STOP DOING THE CATH DUE TO THIS/ ALSO PRODUCED SEVERE HEADACHE FOR WEEKS/ PT REFUSING FURTHER CONTRAST/ TSF   . Shrimp (Shellfish Allergy) Other (See Comments)    gout  . Statins   . Toprol Xl (Metoprolol Succinate)     Cough and extreme SOB  Past medical history, social history, and family history reviewed and updated.  ROS:  Denies chest pain, orthopnea, PND, pedal edema, palpitations, lightheadedness or syncope.   PHYSICAL EXAM: BP 149/76  Pulse 80  Ht 5' (1.524 m)  Wt 72.576 kg (160 lb)  BMI 31.25 kg/m2  SpO2 97%  General-Well developed; no acute distress Body habitus-proportionate weight and height Neck-No JVD; right  carotid bruit Lungs-few basilar rales ; resonant to percussion; 1+ kyphosis  Cardiovascular-normal PMI; normal S1 and S2; modest systolic murmur; irregular  Abdomen-normal bowel sounds; soft and non-tender without masses or organomegaly Musculoskeletal-No deformities, no cyanosis or clubbing Neurologic-Normal cranial nerves; symmetric strength and tone Skin-Warm, no significant lesions Extremities-distal pulses intact; 1/2+ edema  ASSESSMENT AND PLAN:  Lily [place] Bing, MD 08/16/2012 8:36 PM

## 2012-08-16 NOTE — Assessment & Plan Note (Signed)
Warfarin has been effective and well-tolerated except for a flank hematoma in 2012. We will readjust dosing and continue to monitor stool Hemoccult and hemoglobin to exclude occult blood loss.

## 2012-08-16 NOTE — Assessment & Plan Note (Signed)
Blood pressure control has been borderline in recent months. Patient and PCP will continue to monitor.

## 2012-08-16 NOTE — Assessment & Plan Note (Signed)
Mild chronic thrombocytopenia. He has never previously evaluated, basic testing might be worthwhile. Selection of tests will be left to Dr. Alonza Smoker discretion.

## 2012-09-15 ENCOUNTER — Ambulatory Visit: Payer: Self-pay | Admitting: *Deleted

## 2012-09-15 DIAGNOSIS — Z7901 Long term (current) use of anticoagulants: Secondary | ICD-10-CM

## 2012-09-15 DIAGNOSIS — I482 Chronic atrial fibrillation, unspecified: Secondary | ICD-10-CM

## 2012-12-19 ENCOUNTER — Ambulatory Visit (INDEPENDENT_AMBULATORY_CARE_PROVIDER_SITE_OTHER): Payer: Medicare Other | Admitting: Pulmonary Disease

## 2012-12-19 ENCOUNTER — Encounter: Payer: Self-pay | Admitting: Pulmonary Disease

## 2012-12-19 VITALS — BP 130/72 | HR 86 | Temp 97.7°F | Ht 60.0 in | Wt 158.8 lb

## 2012-12-19 DIAGNOSIS — J209 Acute bronchitis, unspecified: Secondary | ICD-10-CM

## 2012-12-19 DIAGNOSIS — J441 Chronic obstructive pulmonary disease with (acute) exacerbation: Secondary | ICD-10-CM

## 2012-12-19 MED ORDER — PREDNISONE 10 MG PO TABS: ORAL_TABLET | ORAL | Status: DC

## 2012-12-19 MED ORDER — LEVOFLOXACIN 750 MG PO TABS: 750.0000 mg | ORAL_TABLET | Freq: Every day | ORAL | Status: DC

## 2012-12-19 NOTE — Assessment & Plan Note (Signed)
The patient has had increasing shortness of breath that I suspect is related to her acute bronchitis and early COPD flare.  We'll treat her with a short course of prednisone.

## 2012-12-19 NOTE — Patient Instructions (Addendum)
Will treat with an antibiotic and short course of prednisone.  Please call me if you are not improving. Take your dulera 2 puffs twice a day religiously followup with me in 6mos if doing well, but call if having breathing issues.

## 2012-12-19 NOTE — Addendum Note (Signed)
Addended by: Caryl Ada on: 12/19/2012 11:54 AM   Modules accepted: Orders

## 2012-12-19 NOTE — Progress Notes (Signed)
  Subjective:    Patient ID: [name], female    DOB: 1927-03-26, 79 y.o.   MRN: [id number]  HPI The patient comes in today for followup of her known COPD, but she has been getting sick over the last 2 weeks.  She describes increased dyspnea on exertion, as well as increasing cough with significant chest congestion.  She is unable to produce mucus to know if it is purulent or not.  She denies any chest discomfort, fever, or worsening lower extremity edema.  Her weight is stable from the last visit.   Review of Systems  Constitutional: Negative for fever and unexpected weight change.  HENT: Positive for congestion, rhinorrhea, sneezing and postnasal drip. Negative for ear pain, nosebleeds, sore throat, trouble swallowing, dental problem and sinus pressure.   Eyes: Positive for redness and itching. Negative for discharge.  Respiratory: Positive for cough, chest tightness, shortness of breath and wheezing.   Cardiovascular: Positive for leg swelling. Negative for palpitations.  Gastrointestinal: Negative for nausea and vomiting.  Genitourinary: Negative for dysuria.  Musculoskeletal: Positive for joint swelling.  Skin: Positive for rash.  Neurological: Negative for headaches.  Hematological: Bruises/bleeds easily.  Psychiatric/Behavioral: Negative for dysphoric mood. The patient is not nervous/anxious.        Objective:   Physical Exam Frail-appearing female in no acute distress Nose without purulence or discharge noted Neck without lymphadenopathy or thyromegaly Chest with decreased breath sounds, and basilar crackles, and a few rhonchi noted. Cardiac exam with irregular rhythm with controlled ventricular response. 2/ 6 systolic murmur Lower extremities with 2+ edema, no cyanosis Alert and oriented, moves all 4 extremities.      Assessment & Plan:

## 2012-12-19 NOTE — Assessment & Plan Note (Signed)
The patient's history is most consistent with an episode of acute bronchitis.  She will need treatment with a course of antibiotics to get her through this given her lack of reserve and high risk of decompensation.

## 2012-12-22 ENCOUNTER — Other Ambulatory Visit: Payer: Self-pay | Admitting: Cardiology

## 2012-12-23 NOTE — Telephone Encounter (Signed)
rx sent to pharmacy by e-script  

## 2012-12-26 ENCOUNTER — Telehealth: Payer: Self-pay | Admitting: *Deleted

## 2012-12-26 NOTE — Telephone Encounter (Signed)
Called pt to clarify how she is taking the medication, pt noted that she was told when you increase the Lasix you need to also increase the Potassium, pt states she is taking Potassium one and a half tabs in the AM and one and half tabs in the PM the same dosage as her Lasix per notes she takes One a half tab of 40mg  Lasix in the am and One and a half tab in the PM, unable to locate recent lab changes or OV that changed the dosage, noted pt currently listed as taking 60mg  Lasix (one and half tab of the 40mg  tablet) BID, pt to be taking Potassium one tablet twice daily, noted pt granddaughter works at pharmacy and has been giving pt extra to supply when pt runs out, please advise:  Cala Bradford A. Sebastien Jackson L.P.N.  Pt stated the lady doctor told her when you increase your lasix you have to increase your potassium noted pt was advised of lasix/potassium protocol on her OV with KL noted 04-03-2011 per D/C summary below:  Your physician recommends that you schedule a follow-up appointment on the 29th  Your physician has recommended you make the following change in your medication: INCREASE Lasix and Potassium for 4 days -- (half tablet in the afternoon and potassium also) -- START Keflex 500 mg twice daily for 7 days  Please wear your TED hose  You have been referred to Dr Juanetta Gosling for a pulmonary workup

## 2012-12-26 NOTE — Telephone Encounter (Signed)
[facility] [phone] IS CALLING FOR PATIENT KLOR-CON RX. PT IS TELLING THEM THAT HER DOSE WAS INCREASED BUT SHE DOESN'T THINK IT WAS NOTED IN HER CHART.  I LOOK OVER LAST TWO OFFICE VISIT AND DIDN'T SEE AN INCREASE. PLEASE LOOK BEHIND ME AND CALL PATIENT AND PHARMACY BACK.

## 2012-12-28 NOTE — Telephone Encounter (Signed)
Please schedule appointment with [name] to determine exactly what patient is taking, who ordered it, and what she should be taking.

## 2013-01-03 NOTE — Telephone Encounter (Signed)
Called pt to schedule appt with kl on 01/18/13. Pt states she just went back to taking it the way she always had and will call us if she has problems. She does not want to come in for a visit. ----- Message ----- From: Kathlen Brunswick, MD Sent: 12/28/2012 11:00 AM To: Faythe Ghee

## 2013-01-18 ENCOUNTER — Ambulatory Visit: Payer: Medicare Other | Admitting: Adult Health

## 2013-01-25 ENCOUNTER — Telehealth: Payer: Self-pay | Admitting: Pulmonary Disease

## 2013-01-25 MED ORDER — ALBUTEROL SULFATE HFA 108 (90 BASE) MCG/ACT IN AERS: 2.0000 | INHALATION_SPRAY | Freq: Four times a day (QID) | RESPIRATORY_TRACT | Status: AC | PRN

## 2013-01-25 NOTE — Telephone Encounter (Signed)
Patient Instructions    Will treat with an antibiotic and short course of prednisone. Please call me if you are not improving.  Take your dulera 2 puffs twice a day religiously  followup with me in 6mos if doing well, but call if having breathing issues  ----  RX has been sent for the ventolin. I made her aware of directions for dulera. Nothing further was needed

## 2013-02-13 ENCOUNTER — Encounter: Payer: Self-pay | Admitting: Cardiology

## 2013-02-13 ENCOUNTER — Other Ambulatory Visit: Payer: Self-pay | Admitting: Cardiology

## 2013-02-13 ENCOUNTER — Ambulatory Visit (INDEPENDENT_AMBULATORY_CARE_PROVIDER_SITE_OTHER): Payer: Medicare Other | Admitting: *Deleted

## 2013-02-13 DIAGNOSIS — I4891 Unspecified atrial fibrillation: Secondary | ICD-10-CM

## 2013-02-13 LAB — BASIC METABOLIC PANEL
BUN: 21 mg/dL (ref 6–23)
CO2: 30 mEq/L (ref 19–32)
Calcium: 9 mg/dL (ref 8.4–10.5)
Creat: 0.91 mg/dL (ref 0.50–1.10)
Glucose, Bld: 92 mg/dL (ref 70–99)

## 2013-02-13 LAB — CBC
HCT: 38.2 % (ref 36.0–46.0)
MCH: 31.5 pg (ref 26.0–34.0)
MCV: 92.5 fL (ref 78.0–100.0)
Platelets: 104 10*3/uL — ABNORMAL LOW (ref 150–400)
RBC: 4.13 MIL/uL (ref 3.87–5.11)

## 2013-02-15 ENCOUNTER — Encounter: Payer: Self-pay | Admitting: *Deleted

## 2013-02-15 ENCOUNTER — Encounter: Payer: Self-pay | Admitting: Cardiology

## 2013-02-16 NOTE — Patient Instructions (Signed)
Xarelto 3 month F/U:  Pt was started on Xarelto 20mg  daily on 08/15/12 by Dr Dietrich Pates for atrial fib.  She has tolerated medication well without any S/S of GI upset or abnormal bleeding or bruising.  Pt is happy being on Xarelto and wishes to continue.  Labs were obtained for CBC and BMP on 08/15/12.  HBG 13  HCT 38.2  PLT 104  BUN21  SrCr 0.91  CrCl 52.08. Labs remain stable so she will continue Xarelto 20mg  daily.  F/U scheduled in recall for 6 months.

## 2013-02-20 ENCOUNTER — Other Ambulatory Visit: Payer: Self-pay | Admitting: Cardiology

## 2013-03-16 ENCOUNTER — Other Ambulatory Visit: Payer: Self-pay | Admitting: Cardiology

## 2013-06-20 ENCOUNTER — Ambulatory Visit (INDEPENDENT_AMBULATORY_CARE_PROVIDER_SITE_OTHER): Payer: Medicare Other | Admitting: Pulmonary Disease

## 2013-06-20 ENCOUNTER — Encounter: Payer: Self-pay | Admitting: Pulmonary Disease

## 2013-06-20 VITALS — BP 138/72 | HR 77 | Temp 97.9°F | Ht 60.0 in | Wt 151.2 lb

## 2013-06-20 DIAGNOSIS — J449 Chronic obstructive pulmonary disease, unspecified: Secondary | ICD-10-CM

## 2013-06-20 NOTE — Assessment & Plan Note (Signed)
The patient appears to be getting over an acute COPD exacerbation, but was having increased breathing issues even prior to this. She has never been tried on anticholinergic bronchodilators, and we'll therefore add Spiriva to her dulera for a 4 week trial. I've also encouraged her to stay as active as possible, and to work on conditioning.

## 2013-06-20 NOTE — Patient Instructions (Signed)
Stay on dulera one inhalation twice a day Will try spiriva one inhalation each am.  Let us know if this helps, and we can call in a prescription. Finish up prednisone taper. Stay as active as possible followup with me again in 6mos.

## 2013-06-20 NOTE — Progress Notes (Signed)
   Subjective:    Patient ID: [name], female    DOB: [birth year], 79 y.o.   MRN: [id number]  HPI The patient comes in today for followup of her known COPD. She has had a recent URI complicated by a COPD flare, and currently is on a prednisone taper and antibiotics. She feels that she is slowly getting better, but continues to have breathing issues. She denies any purulence at this time, nor has she had any fever.   Review of Systems  Constitutional: Negative for fever and unexpected weight change.  HENT: Positive for congestion, postnasal drip and rhinorrhea. Negative for dental problem, ear pain, nosebleeds, sinus pressure, sneezing, sore throat and trouble swallowing.   Eyes: Negative for redness and itching.  Respiratory: Positive for cough, chest tightness, shortness of breath and wheezing.   Cardiovascular: Positive for palpitations and leg swelling.  Gastrointestinal: Negative for nausea and vomiting.  Genitourinary: Negative for dysuria.  Musculoskeletal: Negative for joint swelling.  Skin: Negative for rash.  Neurological: Negative for headaches.  Hematological: Does not bruise/bleed easily.  Psychiatric/Behavioral: Positive for dysphoric mood. The patient is nervous/anxious.        Objective:   Physical Exam Frail-appearing female in no acute distress Nose without purulence or discharge noted Neck without lymphadenopathy or thyromegaly Chest with decreased breath sounds, no wheezing Cardiac exam with regular rate and rhythm, 2/6 systolic murmur Lower extremities with 2+ edema, no cyanosis Alert and oriented, moves all 4 extremities.       Assessment & Plan:

## 2013-07-11 ENCOUNTER — Other Ambulatory Visit: Payer: Self-pay | Admitting: Pulmonary Disease

## 2013-07-11 MED ORDER — MOMETASONE FURO-FORMOTEROL FUM 100-5 MCG/ACT IN AERO: 2.0000 | INHALATION_SPRAY | Freq: Two times a day (BID) | RESPIRATORY_TRACT | Status: AC

## 2013-07-11 NOTE — Telephone Encounter (Signed)
Received faxed refill request from [facility] for [facility] 100-5 Original rx written 12.2.13 w/ 6 refills is out of date Medication last refilled 10.27.14 Last ov w/ KC was 12.2.14: Patient Instructions     Stay on dulera one inhalation twice a day  Will try spiriva one inhalation each am. Let us know if this helps, and we can call in a prescription.  Finish up prednisone taper.  Stay as active as possible  followup with me again in 6mos.     Refills sent to pharmacy

## 2013-07-24 ENCOUNTER — Telehealth: Payer: Self-pay | Admitting: Pulmonary Disease

## 2013-07-24 MED ORDER — TIOTROPIUM BROMIDE MONOHYDRATE 18 MCG IN CAPS: 18.0000 ug | ORAL_CAPSULE | Freq: Every day | RESPIRATORY_TRACT | Status: AC

## 2013-07-24 NOTE — Telephone Encounter (Signed)
Spoke with daughter. She reports the spiriva has helped with her breathing. Can tell a difference. The wheezing and chest tx has decreased since being on this medication. I advised will send RX into the pharmacy. Will forward to [facility] as an [place]

## 2013-07-24 NOTE — Telephone Encounter (Signed)
lmomtcb x1 for [employer]

## 2013-08-10 ENCOUNTER — Encounter: Payer: Self-pay | Admitting: Cardiology

## 2013-08-10 ENCOUNTER — Ambulatory Visit (INDEPENDENT_AMBULATORY_CARE_PROVIDER_SITE_OTHER): Payer: Medicare Other | Admitting: Cardiology

## 2013-08-10 ENCOUNTER — Telehealth: Payer: Self-pay

## 2013-08-10 VITALS — BP 139/42 | HR 92 | Ht 60.0 in | Wt 148.0 lb

## 2013-08-10 DIAGNOSIS — E785 Hyperlipidemia, unspecified: Secondary | ICD-10-CM

## 2013-08-10 DIAGNOSIS — I2510 Atherosclerotic heart disease of native coronary artery without angina pectoris: Secondary | ICD-10-CM

## 2013-08-10 DIAGNOSIS — I4891 Unspecified atrial fibrillation: Secondary | ICD-10-CM

## 2013-08-10 DIAGNOSIS — I10 Essential (primary) hypertension: Secondary | ICD-10-CM

## 2013-08-10 MED ORDER — DIGOXIN 125 MCG PO TABS: ORAL_TABLET | ORAL | Status: DC

## 2013-08-10 MED ORDER — APIXABAN 5 MG PO TABS
5.0000 mg | ORAL_TABLET | Freq: Two times a day (BID) | ORAL | Status: DC
Start: 2013-08-10 — End: 2013-09-27

## 2013-08-10 NOTE — Patient Instructions (Addendum)
Your physician wants you to follow-up in: 4 months You will receive a reminder letter in the mail two months in advance. If you don't receive a letter, please call our office to schedule the follow-up appointment.  Your physician has recommended you make the following change in your medication:    STOP: Xarelto   START: Eliquis 5 mg tice a day  I have given you a two week supply plus a thirty day free presription card and also a card for $10 co-pay provided by drug company  I requested your lab results from [facility]

## 2013-08-10 NOTE — Telephone Encounter (Signed)
Can you have Parthenia Ames please change her digoxin to 0.125mg  every other day. Thanks   Per Carlyle Dolly MD      Done by C.Wilber Oliphant RN

## 2013-08-10 NOTE — Progress Notes (Signed)
Clinical Summary Ms. Venneman is a 80 y.o.female former patient of Dr Lattie Haw, this is our first visit together. She is seen for the following medical problems.  1. CAD - prior PTCA to LAD in 1998 - no recent chest pain. No new significant SOB or DOE, though fairly sedentary lifestyle. She attributes most of her symptoms to COPD. She is able to do house chores without issues.  - compliant with meds  2. Chronic afib - on diltiazem, digoxin, and xarelto - feels palpitations rarely, tends to come on more with increased activity.  - reports recent change to xarelto, notes increased bruising. Reports it is costing her quite a lot per month as well.   3. Hyperlipidemia -reports rash and muscle aches on prior statins, several tried by her PCP Dr Willey Blade - currently not on statin therapy  4. COPD - on home oxygen - compliant with inhalers  5. HTN - does not check at home - compliant with meds  Past Medical History  Diagnosis Date  . Hypertension   . Arteriosclerotic cardiovascular disease (ASCVD) 1998    80% LAD->PTCA;no significant disease in circumflex or RCA-1998; negative pharmacologic stress nuclear in 8/05  . Chronic atrial fibrillation 1998    DC cardioversion->  Recurrent AF  . Chronic anticoagulation   . Hyperlipidemia   . Hyperparathyroidism     parathyroidectomy  . Nephrolithiasis   . Degenerative joint disease     Chronic low back pain; knee pain  . Gout      Allergies  Allergen Reactions  . Baycol [Cerivastatin Sodium] Other (See Comments)    Rhabdomyolysis   . Iohexol      Desc: PT GOT VERY SOB DURING CARDIAC CATH IN 1998/ STATES THEY HAD TO STOP DOING THE CATH DUE TO THIS/ ALSO PRODUCED SEVERE HEADACHE FOR WEEKS/ PT REFUSING FURTHER CONTRAST/ TSF   . Shrimp [Shellfish Allergy] Other (See Comments)    gout  . Statins   . Toprol Xl [Metoprolol Succinate]     Cough and extreme SOB     Current Outpatient Prescriptions  Medication Sig Dispense  Refill  . albuterol (PROVENTIL HFA;VENTOLIN HFA) 108 (90 BASE) MCG/ACT inhaler Inhale 2 puffs into the lungs every 6 (six) hours as needed for wheezing.  1 Inhaler  6  . carboxymethylcellulose (REFRESH PLUS) 0.5 % SOLN 1 drop as needed.      . digoxin (LANOXIN) 0.125 MG tablet TAKE ONE (1) TABLET BY MOUTH EVERY      DAY  15 tablet  0  . diltiazem (CARDIZEM CD) 240 MG 24 hr capsule Take 1 tablet by mouth daily.      . furosemide (LASIX) 20 MG tablet Take 20 mg by mouth 2 (two) times daily.      Marland Kitchen HYDROcodone-acetaminophen (VICODIN) 5-500 MG per tablet Take 1 tablet by mouth daily.       Marland Kitchen KLOR-CON M20 20 MEQ tablet TAKE ONE TABLET TWICE DAILY  60 tablet  6  . mometasone-formoterol (DULERA) 100-5 MCG/ACT AERO Inhale 2 puffs into the lungs 2 (two) times daily.  13 g  6  . predniSONE (DELTASONE) 10 MG tablet Take 4 tablets for 2 days, 3 tablets for 2 days, 2 tablets for 2 days, 1 tablet for 2 days then stop  18 tablet  0  . tiotropium (SPIRIVA) 18 MCG inhalation capsule Place 1 capsule (18 mcg total) into inhaler and inhale daily.  30 capsule  6  . XARELTO 20 MG TABS tablet TAKE  ONE (1) TABLET BY MOUTH EVERY DAY  30 tablet  6   No current facility-administered medications for this visit.     Past Surgical History  Procedure Laterality Date  . Parathyroidectomy  1970s  . Orif hip fracture  2004    Left hip, following fracture  . Knee arthroscopy      bilat.   . Kidney stone surgery      x 2  . Total abdominal hysterectomy  late 1960s  . Coronary stent placement  1998  . Colonoscopy  Prior to 2003     Allergies  Allergen Reactions  . Baycol [Cerivastatin Sodium] Other (See Comments)    Rhabdomyolysis   . Iohexol      Desc: PT GOT VERY SOB DURING CARDIAC CATH IN 1998/ STATES THEY HAD TO STOP DOING THE CATH DUE TO THIS/ ALSO PRODUCED SEVERE HEADACHE FOR WEEKS/ PT REFUSING FURTHER CONTRAST/ TSF   . Shrimp [Shellfish Allergy] Other (See Comments)    gout  . Statins   . Toprol Xl  [Metoprolol Succinate]     Cough and extreme SOB      Family History  Problem Relation Age of Onset  . Heart attack Mother   . Heart disease Mother   . Hypertension Mother   . Heart attack Father   . Heart disease Father   . Emphysema Father   . Asthma Daughter   . Hypertension Sister   . Cancer Brother   . Asthma Brother   . Emphysema Brother   . Hypertension Sister   . Diabetes Sister   . Kidney disease Sister   . Hypertension Sister   . Cancer Sister   . Hypertension Sister   . Cancer Sister      Social History Ms. Dagher reports that she has never smoked. She does not have any smokeless tobacco history on file. Ms. Morello reports that she does not drink alcohol.   Review of Systems CONSTITUTIONAL: No weight loss, fever, chills, weakness or fatigue.  HEENT: Eyes: No visual loss, blurred vision, double vision or yellow sclerae.No hearing loss, sneezing, congestion, runny nose or sore throat.  SKIN: No rash or itching.  CARDIOVASCULAR: per HPI RESPIRATORY: No shortness of breath, cough or sputum.  GASTROINTESTINAL: No anorexia, nausea, vomiting or diarrhea. No abdominal pain or blood.  GENITOURINARY: No burning on urination, no polyuria NEUROLOGICAL: No headache, dizziness, syncope, paralysis, ataxia, numbness or tingling in the extremities. No change in bowel or bladder control.  MUSCULOSKELETAL: No muscle, back pain, joint pain or stiffness.  LYMPHATICS: No enlarged nodes. No history of splenectomy.  PSYCHIATRIC: No history of depression or anxiety.  ENDOCRINOLOGIC: No reports of sweating, cold or heat intolerance. No polyuria or polydipsia.  Marland Kitchen   Physical Examination p 92 bp 139/42 Wt 148 lbs BMI 29 Gen: resting comfortably, no acute distress HEENT: no scleral icterus, pupils equal round and reactive, no palptable cervical adenopathy,  CV: irreg, 2/6 systolic murmur at apex, no JVD Resp: Clear to auscultation bilaterally GI: abdomen is soft,  non-tender, non-distended, normal bowel sounds, no hepatosplenomegaly MSK: extremities are warm, no edema.  Skin: warm, no rash Neuro:  no focal deficits Psych: appropriate affect   Diagnostic Studies 03/2011 Echo LVEF 50-55%, now WMAs, mild MR, mod biatrial enlargement, PASP 74. Diastolic function not described  08/10/13 EKG Afib, sagging ST depression inceiror and lateral limb leads  Assessment and Plan  1. CAD - no current symptoms, continue current risk factor modification - she is not on  ASA as we she is currently on anticoagulation for afib and is higher riks to bleed.   2. Afib - no current symptoms - reports increased bruising since starting xarelto, the cost is a lot for her as well - will switch to eliquis with drug assistance program - change digoxin to 0.125mg  every other day  3. Hyperlipidemia - intolerant to statins, no currently on therapy - will check lipids at next visit  4. HTN - at goal, continue current meds    Arnoldo Lenis, M.D., F.A.C.C.

## 2013-08-11 ENCOUNTER — Telehealth: Payer: Self-pay | Admitting: Cardiology

## 2013-08-11 NOTE — Telephone Encounter (Signed)
Please see paper in refill bin / tgs  °

## 2013-08-14 ENCOUNTER — Other Ambulatory Visit: Payer: Self-pay | Admitting: Cardiology

## 2013-08-14 NOTE — Addendum Note (Signed)
Addended by: Truett Mainland on: 08/14/2013 10:22 AM   Modules accepted: Orders

## 2013-09-18 ENCOUNTER — Inpatient Hospital Stay (HOSPITAL_COMMUNITY)
Admission: EM | Admit: 2013-09-18 | Discharge: 2013-09-27 | DRG: 291 | Disposition: A | Discharge status: Skilled Nursing Facility | Payer: Medicare Other | Attending: Internal Medicine | Admitting: Internal Medicine

## 2013-09-18 ENCOUNTER — Other Ambulatory Visit: Payer: Self-pay | Admitting: Cardiology

## 2013-09-18 ENCOUNTER — Encounter (HOSPITAL_COMMUNITY): Payer: Self-pay | Admitting: Emergency Medicine

## 2013-09-18 ENCOUNTER — Emergency Department (HOSPITAL_COMMUNITY): Payer: Medicare Other

## 2013-09-18 DIAGNOSIS — I4891 Unspecified atrial fibrillation: Secondary | ICD-10-CM

## 2013-09-18 DIAGNOSIS — K560 Paralytic ileus: Secondary | ICD-10-CM | POA: Diagnosis not present

## 2013-09-18 DIAGNOSIS — E213 Hyperparathyroidism, unspecified: Secondary | ICD-10-CM | POA: Diagnosis present

## 2013-09-18 DIAGNOSIS — K922 Gastrointestinal hemorrhage, unspecified: Secondary | ICD-10-CM

## 2013-09-18 DIAGNOSIS — Z9861 Coronary angioplasty status: Secondary | ICD-10-CM

## 2013-09-18 DIAGNOSIS — K2960 Other gastritis without bleeding: Secondary | ICD-10-CM | POA: Diagnosis present

## 2013-09-18 DIAGNOSIS — D696 Thrombocytopenia, unspecified: Secondary | ICD-10-CM

## 2013-09-18 DIAGNOSIS — I482 Chronic atrial fibrillation, unspecified: Secondary | ICD-10-CM

## 2013-09-18 DIAGNOSIS — K449 Diaphragmatic hernia without obstruction or gangrene: Secondary | ICD-10-CM | POA: Diagnosis present

## 2013-09-18 DIAGNOSIS — Z90710 Acquired absence of both cervix and uterus: Secondary | ICD-10-CM

## 2013-09-18 DIAGNOSIS — Z9981 Dependence on supplemental oxygen: Secondary | ICD-10-CM

## 2013-09-18 DIAGNOSIS — Z7901 Long term (current) use of anticoagulants: Secondary | ICD-10-CM

## 2013-09-18 DIAGNOSIS — D126 Benign neoplasm of colon, unspecified: Secondary | ICD-10-CM | POA: Diagnosis present

## 2013-09-18 DIAGNOSIS — G8929 Other chronic pain: Secondary | ICD-10-CM | POA: Diagnosis present

## 2013-09-18 DIAGNOSIS — K621 Rectal polyp: Secondary | ICD-10-CM

## 2013-09-18 DIAGNOSIS — Z833 Family history of diabetes mellitus: Secondary | ICD-10-CM

## 2013-09-18 DIAGNOSIS — Z825 Family history of asthma and other chronic lower respiratory diseases: Secondary | ICD-10-CM

## 2013-09-18 DIAGNOSIS — I2510 Atherosclerotic heart disease of native coronary artery without angina pectoris: Secondary | ICD-10-CM

## 2013-09-18 DIAGNOSIS — J4489 Other specified chronic obstructive pulmonary disease: Secondary | ICD-10-CM | POA: Diagnosis present

## 2013-09-18 DIAGNOSIS — I509 Heart failure, unspecified: Secondary | ICD-10-CM

## 2013-09-18 DIAGNOSIS — R109 Unspecified abdominal pain: Secondary | ICD-10-CM | POA: Diagnosis present

## 2013-09-18 DIAGNOSIS — J449 Chronic obstructive pulmonary disease, unspecified: Secondary | ICD-10-CM

## 2013-09-18 DIAGNOSIS — E46 Unspecified protein-calorie malnutrition: Secondary | ICD-10-CM | POA: Diagnosis present

## 2013-09-18 DIAGNOSIS — E785 Hyperlipidemia, unspecified: Secondary | ICD-10-CM | POA: Diagnosis present

## 2013-09-18 DIAGNOSIS — Z800 Family history of malignant neoplasm of digestive organs: Secondary | ICD-10-CM

## 2013-09-18 DIAGNOSIS — J9610 Chronic respiratory failure, unspecified whether with hypoxia or hypercapnia: Secondary | ICD-10-CM

## 2013-09-18 DIAGNOSIS — E876 Hypokalemia: Secondary | ICD-10-CM | POA: Diagnosis present

## 2013-09-18 DIAGNOSIS — I872 Venous insufficiency (chronic) (peripheral): Secondary | ICD-10-CM | POA: Diagnosis present

## 2013-09-18 DIAGNOSIS — N390 Urinary tract infection, site not specified: Secondary | ICD-10-CM | POA: Diagnosis present

## 2013-09-18 DIAGNOSIS — A498 Other bacterial infections of unspecified site: Secondary | ICD-10-CM | POA: Diagnosis present

## 2013-09-18 DIAGNOSIS — I2789 Other specified pulmonary heart diseases: Secondary | ICD-10-CM | POA: Diagnosis present

## 2013-09-18 DIAGNOSIS — J9620 Acute and chronic respiratory failure, unspecified whether with hypoxia or hypercapnia: Secondary | ICD-10-CM | POA: Diagnosis present

## 2013-09-18 DIAGNOSIS — K5900 Constipation, unspecified: Secondary | ICD-10-CM | POA: Diagnosis present

## 2013-09-18 DIAGNOSIS — Z91013 Allergy to seafood: Secondary | ICD-10-CM

## 2013-09-18 DIAGNOSIS — D649 Anemia, unspecified: Secondary | ICD-10-CM

## 2013-09-18 DIAGNOSIS — I10 Essential (primary) hypertension: Secondary | ICD-10-CM | POA: Diagnosis present

## 2013-09-18 DIAGNOSIS — I272 Pulmonary hypertension, unspecified: Secondary | ICD-10-CM

## 2013-09-18 DIAGNOSIS — Z8249 Family history of ischemic heart disease and other diseases of the circulatory system: Secondary | ICD-10-CM

## 2013-09-18 DIAGNOSIS — R0902 Hypoxemia: Secondary | ICD-10-CM

## 2013-09-18 DIAGNOSIS — J9611 Chronic respiratory failure with hypoxia: Secondary | ICD-10-CM

## 2013-09-18 DIAGNOSIS — K620 Anal polyp: Secondary | ICD-10-CM | POA: Diagnosis present

## 2013-09-18 DIAGNOSIS — I5033 Acute on chronic diastolic (congestive) heart failure: Principal | ICD-10-CM

## 2013-09-18 DIAGNOSIS — M1990 Unspecified osteoarthritis, unspecified site: Secondary | ICD-10-CM | POA: Diagnosis present

## 2013-09-18 HISTORY — DX: Essential (primary) hypertension: I10

## 2013-09-18 HISTORY — DX: Chronic obstructive pulmonary disease, unspecified: J44.9

## 2013-09-18 HISTORY — DX: Atherosclerotic heart disease of native coronary artery without angina pectoris: I25.10

## 2013-09-18 LAB — COMPREHENSIVE METABOLIC PANEL
ALT: 12 U/L (ref 0–35)
AST: 18 U/L (ref 0–37)
Albumin: 3.4 g/dL — ABNORMAL LOW (ref 3.5–5.2)
Alkaline Phosphatase: 115 U/L (ref 39–117)
BILIRUBIN TOTAL: 0.9 mg/dL (ref 0.3–1.2)
BUN: 31 mg/dL — ABNORMAL HIGH (ref 6–23)
CO2: 30 meq/L (ref 19–32)
Calcium: 8.5 mg/dL (ref 8.4–10.5)
Chloride: 100 mEq/L (ref 96–112)
Creatinine, Ser: 1.04 mg/dL (ref 0.50–1.10)
GFR calc non Af Amer: 48 mL/min — ABNORMAL LOW (ref >90)
GFR, EST AFRICAN AMERICAN: 56 mL/min — AB (ref >90)
GLUCOSE: 96 mg/dL (ref 70–99)
POTASSIUM: 4.2 meq/L (ref 3.7–5.3)
Sodium: 140 mEq/L (ref 137–147)
Total Protein: 7.1 g/dL (ref 6.0–8.3)

## 2013-09-18 LAB — I-STAT TROPONIN, ED: TROPONIN I, POC: 0.01 ng/mL (ref 0.00–0.08)

## 2013-09-18 LAB — CBC WITH DIFFERENTIAL/PLATELET
Basophils Absolute: 0 10*3/uL (ref 0.0–0.1)
Basophils Relative: 0 % (ref 0–1)
EOS PCT: 1 % (ref 0–5)
Eosinophils Absolute: 0.2 10*3/uL (ref 0.0–0.7)
HCT: 26.5 % — ABNORMAL LOW (ref 36.0–46.0)
Hemoglobin: 7.9 g/dL — ABNORMAL LOW (ref 12.0–15.0)
LYMPHS ABS: 1.6 10*3/uL (ref 0.7–4.0)
Lymphocytes Relative: 10 % — ABNORMAL LOW (ref 12–46)
MCH: 24.9 pg — AB (ref 26.0–34.0)
MCHC: 29.8 g/dL — ABNORMAL LOW (ref 30.0–36.0)
MCV: 83.6 fL (ref 78.0–100.0)
Monocytes Absolute: 3.4 10*3/uL — ABNORMAL HIGH (ref 0.1–1.0)
Monocytes Relative: 21 % — ABNORMAL HIGH (ref 3–12)
NEUTROS ABS: 10.9 10*3/uL — AB (ref 1.7–7.7)
Neutrophils Relative %: 68 % (ref 43–77)
Platelets: 121 10*3/uL — ABNORMAL LOW (ref 150–400)
RBC: 3.17 MIL/uL — AB (ref 3.87–5.11)
RDW: 17.6 % — AB (ref 11.5–15.5)
WBC: 16.1 10*3/uL — ABNORMAL HIGH (ref 4.0–10.5)

## 2013-09-18 LAB — URINALYSIS, ROUTINE W REFLEX MICROSCOPIC
BILIRUBIN URINE: NEGATIVE
Glucose, UA: NEGATIVE mg/dL
Hgb urine dipstick: NEGATIVE
KETONES UR: NEGATIVE mg/dL
Nitrite: POSITIVE — AB
Protein, ur: NEGATIVE mg/dL
Specific Gravity, Urine: 1.02 (ref 1.005–1.030)
UROBILINOGEN UA: 0.2 mg/dL (ref 0.0–1.0)
pH: 5.5 (ref 5.0–8.0)

## 2013-09-18 LAB — PROTIME-INR
INR: 1.99 — ABNORMAL HIGH (ref 0.00–1.49)
Prothrombin Time: 22 seconds — ABNORMAL HIGH (ref 11.6–15.2)

## 2013-09-18 LAB — TROPONIN I: Troponin I: <0.3 ng/mL (ref <0.30)

## 2013-09-18 LAB — URINE MICROSCOPIC-ADD ON

## 2013-09-18 LAB — PRO B NATRIURETIC PEPTIDE: Pro B Natriuretic peptide (BNP): 1451 pg/mL — ABNORMAL HIGH (ref 0–450)

## 2013-09-18 MED ORDER — FUROSEMIDE 10 MG/ML IJ SOLN
20.0000 mg | Freq: Two times a day (BID) | INTRAMUSCULAR | Status: DC
  Administered 2013-09-18: 20 mg via INTRAVENOUS
  Filled 2013-09-18: qty 2

## 2013-09-18 MED ORDER — DIGOXIN 125 MCG PO TABS
0.1250 mg | ORAL_TABLET | ORAL | Status: DC
  Administered 2013-09-20 – 2013-09-26 (×4): 0.125 mg via ORAL
  Filled 2013-09-18 (×4): qty 1

## 2013-09-18 MED ORDER — MOMETASONE FURO-FORMOTEROL FUM 100-5 MCG/ACT IN AERO
INHALATION_SPRAY | RESPIRATORY_TRACT | Status: AC
  Filled 2013-09-18: qty 8.8

## 2013-09-18 MED ORDER — DILTIAZEM HCL ER COATED BEADS 240 MG PO CP24
240.0000 mg | ORAL_CAPSULE | Freq: Every day | ORAL | Status: DC
  Administered 2013-09-19 – 2013-09-27 (×9): 240 mg via ORAL
  Filled 2013-09-18 (×9): qty 1

## 2013-09-18 MED ORDER — MOMETASONE FURO-FORMOTEROL FUM 100-5 MCG/ACT IN AERO
2.0000 | INHALATION_SPRAY | Freq: Two times a day (BID) | RESPIRATORY_TRACT | Status: DC
  Administered 2013-09-19 – 2013-09-27 (×17): 2 via RESPIRATORY_TRACT
  Filled 2013-09-18: qty 8.8

## 2013-09-18 MED ORDER — TIOTROPIUM BROMIDE MONOHYDRATE 18 MCG IN CAPS
18.0000 ug | ORAL_CAPSULE | Freq: Every day | RESPIRATORY_TRACT | Status: DC
  Administered 2013-09-19 – 2013-09-27 (×9): 18 ug via RESPIRATORY_TRACT
  Filled 2013-09-18 (×2): qty 5

## 2013-09-18 MED ORDER — FUROSEMIDE 10 MG/ML IJ SOLN
80.0000 mg | Freq: Once | INTRAMUSCULAR | Status: AC
  Administered 2013-09-18: 80 mg via INTRAVENOUS
  Filled 2013-09-18: qty 8

## 2013-09-18 MED ORDER — DIPHENHYDRAMINE HCL 25 MG PO CAPS
25.0000 mg | ORAL_CAPSULE | Freq: Every evening | ORAL | Status: DC | PRN
  Administered 2013-09-18: 25 mg via ORAL
  Filled 2013-09-18: qty 1

## 2013-09-18 MED ORDER — ACETAMINOPHEN 500 MG PO TABS
500.0000 mg | ORAL_TABLET | Freq: Every evening | ORAL | Status: DC | PRN
  Administered 2013-09-18 – 2013-09-26 (×4): 500 mg via ORAL
  Filled 2013-09-18 (×4): qty 1

## 2013-09-18 MED ORDER — SODIUM CHLORIDE 0.9 % IJ SOLN
3.0000 mL | INTRAMUSCULAR | Status: DC | PRN
  Administered 2013-09-21: 3 mL via INTRAVENOUS

## 2013-09-18 MED ORDER — SODIUM CHLORIDE 0.9 % IV SOLN
250.0000 mL | INTRAVENOUS | Status: DC | PRN
  Administered 2013-09-22 – 2013-09-26 (×2): 250 mL via INTRAVENOUS

## 2013-09-18 MED ORDER — TIOTROPIUM BROMIDE MONOHYDRATE 18 MCG IN CAPS
ORAL_CAPSULE | RESPIRATORY_TRACT | Status: AC
Start: 2013-09-18 — End: 2013-09-18
  Filled 2013-09-18: qty 5

## 2013-09-18 MED ORDER — SODIUM CHLORIDE 0.9 % IJ SOLN
3.0000 mL | Freq: Two times a day (BID) | INTRAMUSCULAR | Status: DC
Start: 2013-09-18 — End: 2013-09-27
  Administered 2013-09-18 – 2013-09-25 (×9): 3 mL via INTRAVENOUS

## 2013-09-18 MED ORDER — ALBUTEROL SULFATE (2.5 MG/3ML) 0.083% IN NEBU: 3.0000 mL | INHALATION_SOLUTION | Freq: Four times a day (QID) | RESPIRATORY_TRACT | Status: DC | PRN

## 2013-09-18 MED ORDER — NITROGLYCERIN 0.4 MG SL SUBL
0.4000 mg | SUBLINGUAL_TABLET | SUBLINGUAL | Status: AC | PRN
  Administered 2013-09-18 (×3): 0.4 mg via SUBLINGUAL
  Filled 2013-09-18: qty 1

## 2013-09-18 NOTE — ED Notes (Signed)
Pt's family reports pt has had increased SOB and swelling to bilateral lower extremities over the past week.  PT presently on 6 liters of 02 at this time.  Patient alert and oriented.  Family at bedside.

## 2013-09-18 NOTE — ED Provider Notes (Signed)
CSN: [id number]     Arrival date & time 09/18/13  1505 History   This chart was scribed for Babette Relic, MD by Lovena Le Day, ED scribe. This patient was seen in room APAH6/AP[facility]6 and the patient's care was started at 1505.  Chief Complaint  Patient presents with  . Shortness of Breath   HPI HPI Comments: [name] is a 80 y.o. female who presents to the Emergency Department w/h/o COPD, chronic A-fib, home O2, CHF and CAD complaining of increased SOB, increased bilateral lower leg swelling and weeping over the past 7 days. At baseline she can walk 10-15 ft but is unable today and having difficulty w/speech at rest. She has increased SOB at rest and PTA pulse oximetry was 84% on 2 LO2; by increasing to 6L helped her SOB. She has had difficult orthopnea for the past week. She reports grew up with second hand smoking but has never been a smoker herself.   She was switched from Xarelto to Eliquis about a month ago  Her PCP is Dr. Galen Daft and Dr. Gwenette Greet  Past Medical History  Diagnosis Date  . Coronary atherosclerosis of native coronary artery     PTCA LAD 1998; no significant disease in circumflex or RCA 1998  . Chronic atrial fibrillation   . Chronic anticoagulation   . Hyperlipidemia   . Hyperparathyroidism     Parathyroidectomy  . Nephrolithiasis   . Degenerative joint disease     Chronic low back pain; knee pain  . Gout   . Essential hypertension, benign   . COPD (chronic obstructive pulmonary disease)    Past Surgical History  Procedure Laterality Date  . Parathyroidectomy  1970s  . Orif hip fracture  2004    Left hip, following fracture  . Knee arthroscopy      Bilateral  . Kidney stone surgery      x 2  . Total abdominal hysterectomy  Late 1960s  . Colonoscopy  Prior to 2003  . Cardioversion     Family History  Problem Relation Age of Onset  . Heart attack Mother   . Heart disease Mother   . Hypertension Mother   . Heart attack Father   . Heart disease  Father   . Emphysema Father   . Asthma Daughter   . Hypertension Sister   . Cancer Brother   . Asthma Brother   . Emphysema Brother   . Hypertension Sister   . Diabetes Sister   . Kidney disease Sister   . Hypertension Sister   . Cancer Sister   . Hypertension Sister   . Cancer Sister   . Colon cancer Brother     diagnosed in his early 41s   History  Substance Use Topics  . Smoking status: Never Smoker   . Smokeless tobacco: Not on file  . Alcohol Use: No   OB History   Grav Para Term Preterm Abortions TAB SAB Ect Mult Living                 Review of Systems A complete 10 system review of systems was obtained and all systems are negative except as noted in the HPI and PMH.   Allergies  Baycol; Iohexol; Shrimp; Statins; and Toprol xl  Home Medications   No current outpatient prescriptions on file. Triage Vitals: BP 133/60  Pulse 98  Temp(Src) 97.9 F (36.6 C) (Oral)  Resp 22  Ht 5' (1.524 m)  Wt 150 lb (68.04 kg)  BMI 29.30 kg/m2  SpO2 99%  Physical Exam  Nursing note and vitals reviewed. Constitutional:  Awake, alert, nontoxic appearance.  HENT:  Head: Atraumatic.  Eyes: Right eye exhibits no discharge. Left eye exhibits no discharge.  Neck: Neck supple.  Cardiovascular:  Murmur heard. Irregularly irregular Soft systolic murmur CR less than 2 seconds both feet  Pulmonary/Chest: She is in respiratory distress. She has no wheezes. She exhibits no tenderness.  Bibasilar crackles no wheezing, retractions or accessory muscle use; mild respiratory distress  Tachypnea   PTA pulse oximetry was hypoxic at 84% on 2 LO2  Abdominal: Soft. There is no tenderness. There is no rebound.  Musculoskeletal: She exhibits edema. She exhibits no tenderness.  Baseline ROM, no obvious new focal weakness. Moderate edema w/chronic stasis dermatitis w/out apparent cellulitis    Neurological: She is alert.  Mental status and motor strength appears baseline for patient and  situation. Baseline neuropathy w/decreased    Skin: No rash noted.  Chronic stasis dermatitis w/out apparent cellulitis  She has weeping of clear fluid  Psychiatric: She has a normal mood and affect.   ED Course  Procedures (including critical care time)  Chaperone present for rectal examination and stool dark brown Hemoccult-positive.  Med paged for admit. 1735  DIAGNOSTIC STUDIES: Oxygen Saturation is 99% on room air, normal by my interpretation.    COORDINATION OF CARE: At 340 PM Discussed treatment plan with patient which includes CXR, lasix, blood work, EKG, cardiac enzymes. Patient agrees. Patient / Family / Caregiver informed of clinical course, understand medical decision-making process, and agree with plan.  The patient appears reasonably stabilized for admission considering the current resources, flow, and capabilities available in the ED at this time, and I doubt any other [facility] requiring further screening and/or treatment in the ED prior to admission.  Labs Review Labs Reviewed  PROTIME-INR - Abnormal; Notable for the following:    Prothrombin Time 22.0 (*)    INR 1.99 (*)    All other components within normal limits  CBC WITH DIFFERENTIAL - Abnormal; Notable for the following:    WBC 16.1 (*)    RBC 3.17 (*)    Hemoglobin 7.9 (*)    HCT 26.5 (*)    MCH 24.9 (*)    MCHC 29.8 (*)    RDW 17.6 (*)    Platelets 121 (*)    Lymphocytes Relative 10 (*)    Monocytes Relative 21 (*)    Neutro Abs 10.9 (*)    Monocytes Absolute 3.4 (*)    All other components within normal limits  COMPREHENSIVE METABOLIC PANEL - Abnormal; Notable for the following:    BUN 31 (*)    Albumin 3.4 (*)    GFR calc non Af Amer 48 (*)    GFR calc Af Amer 56 (*)    All other components within normal limits  URINALYSIS, ROUTINE W REFLEX MICROSCOPIC - Abnormal; Notable for the following:    Nitrite POSITIVE (*)    Leukocytes, UA SMALL (*)    All other components within normal limits  URINE  MICROSCOPIC-ADD ON - Abnormal; Notable for the following:    Bacteria, UA MANY (*)    All other components within normal limits  PRO B NATRIURETIC PEPTIDE - Abnormal; Notable for the following:    Pro B Natriuretic peptide (BNP) 1451.0 (*)    All other components within normal limits  BASIC METABOLIC PANEL - Abnormal; Notable for the following:    Potassium 3.6 (*)  Glucose, Bld 109 (*)    BUN 31 (*)    Calcium 8.2 (*)    GFR calc non Af Amer 50 (*)    GFR calc Af Amer 58 (*)    All other components within normal limits  CBC - Abnormal; Notable for the following:    WBC 14.3 (*)    RBC 2.81 (*)    Hemoglobin 7.1 (*)    HCT 23.5 (*)    MCH 25.3 (*)    RDW 17.6 (*)    Platelets 100 (*)    All other components within normal limits  DIGOXIN LEVEL - Abnormal; Notable for the following:    Digoxin Level 0.6 (*)    All other components within normal limits  BASIC METABOLIC PANEL - Abnormal; Notable for the following:    Glucose, Bld 121 (*)    BUN 25 (*)    Calcium 8.0 (*)    GFR calc non Af Amer 59 (*)    GFR calc Af Amer 68 (*)    All other components within normal limits  CBC - Abnormal; Notable for the following:    WBC 12.9 (*)    RBC 3.40 (*)    Hemoglobin 8.6 (*)    HCT 29.3 (*)    MCH 25.3 (*)    MCHC 29.4 (*)    RDW 17.2 (*)    Platelets 89 (*)    All other components within normal limits  BASIC METABOLIC PANEL - Abnormal; Notable for the following:    Glucose, Bld 115 (*)    Calcium 7.9 (*)    GFR calc non Af Amer 64 (*)    GFR calc Af Amer 74 (*)    All other components within normal limits  CBC - Abnormal; Notable for the following:    WBC 14.3 (*)    RBC 3.49 (*)    Hemoglobin 9.0 (*)    HCT 29.9 (*)    MCH 25.8 (*)    RDW 16.9 (*)    Platelets 97 (*)    All other components within normal limits  URINE CULTURE  TROPONIN I  TROPONIN I  TROPONIN I  OCCULT BLOOD X 1 CARD TO LAB, STOOL  H. PYLORI ANTIBODY, IGG  BASIC METABOLIC PANEL  I-STAT  TROPOININ, ED  PREPARE RBC (CROSSMATCH)  TYPE AND SCREEN  ABO/RH     EKG Interpretation   Date/Time:  Monday September 18 2013 16:11:27 EST Ventricular Rate:  99 PR Interval:    QRS Duration: 76 QT Interval:  318 QTC Calculation: 408 R Axis:   125 Text Interpretation:  Atrial fibrillation Right axis deviation Low voltage  QRS Nonspecific ST and T wave abnormality When compared with ECG of  10-Mar-2012 13:11, Criteria for Anterior infarct are no longer Present ST  less depressed in Lateral leads Confirmed by [facility]  MD, Jenny Reichmann ([id number]) on  09/18/2013 4:59:39 PM      MDM   Final diagnoses:  CHF (congestive heart failure)  Anemia  GI bleed    I personally performed the services described in this documentation, which was scribed in my presence. The recorded information has been reviewed and is accurate.      Babette Relic, MD 09/21/13 2105

## 2013-09-18 NOTE — ED Notes (Signed)
EMS reports pt c/o worsening SOB and swelling in lower legs.  Reports has weeping edema in bilateral lower extremities.  EMS reports initial 02 sat 84% on 2liters.  Reports pt on 02 at [place] all times.  EMs incresased to 6 liters and sat increased to 90's.  Denies any pain.  EMS started 20g iv in left wrist.   Reports afib on monitor but isn't new for pt.

## 2013-09-18 NOTE — H&P (Signed)
History and Physical  [name] IEP:[id number] DOB: [birth year] DOA: 09/18/2013  Referring physician: Dr. Stevie Kern in ED PCP: Asencion Noble, MD  Cardiologist: Dr. Harl Bowie  Chief Complaint: Short of breath  HPI:  80 year old woman with history of chronic atrial fibrillation maintained on Eliquis, CHF type unknown, CAD presented with increasing shortness of breath over the last 24-48 hours. Initial evaluation suggests acute on chronic hypoxic respiratory failure, acute congestive heart failure, significant normocytic anemia.  She has chronic COPD maintained on oxygen 2 L without significant change recently. She reports no change in orthopnea and no shortness of breath at rest. However with exertion she has become increasingly short of breath over the last few days. No chest pain. She reports chronic lower extremity edema without significant change with the exception of some increased edema in her left lower leg following a fall 8 days ago. She was on Xarelto for atrial fibrillation but was recently changed to Eliquis. No history of bleeding that she is aware of recently or in the past. No blood with bowel movements or coughing up blood. She thinks her left leg is improving. No pain in foot.  In the emergency department afebrile, hypoxia stable on 4 L. Heart rate 90s. Normotensive. Hemoglobin 7.9, complete metabolic panel unremarkable, troponin unremarkable. BNP elevated 1451. Chest x-ray suggested heart failure. EKG poor quality atrial fibrillation no acute changes. Treated with Lasix.  Review of Systems:  Negative for fever, visual changes, sore throat, rash, new muscle aches, chest pain, dysuria, bleeding, n/v/abdominal pain.  Past Medical History  Diagnosis Date  . Arteriosclerotic cardiovascular disease (ASCVD) 1998    80% LAD->PTCA;no significant disease in circumflex or RCA-1998; negative pharmacologic stress nuclear in 8/05  . Chronic atrial fibrillation 1998    DC cardioversion->   Recurrent AF  . Chronic anticoagulation   . Hyperlipidemia   . Hyperparathyroidism     parathyroidectomy  . Nephrolithiasis   . Degenerative joint disease     Chronic low back pain; knee pain  . Gout   . Hypertension     family denies  . COPD (chronic obstructive pulmonary disease)     Past Surgical History  Procedure Laterality Date  . Parathyroidectomy  1970s  . Orif hip fracture  2004    Left hip, following fracture  . Knee arthroscopy      bilat.   . Kidney stone surgery      x 2  . Total abdominal hysterectomy  late 1960s  . Coronary stent placement  1998  . Colonoscopy  Prior to 2003  . Cardioversion      Social History:  reports that she has never smoked. She does not have any smokeless tobacco history on file. She reports that she does not drink alcohol or use illicit drugs.  Allergies  Allergen Reactions  . Baycol [Cerivastatin Sodium] Other (See Comments)    Rhabdomyolysis   . Iohexol      Desc: PT GOT VERY SOB DURING CARDIAC CATH IN 1998/ STATES THEY HAD TO STOP DOING THE CATH DUE TO THIS/ ALSO PRODUCED SEVERE HEADACHE FOR WEEKS/ PT REFUSING FURTHER CONTRAST/ TSF   . Shrimp [Shellfish Allergy] Other (See Comments)    gout  . Statins   . Toprol Xl [Metoprolol Succinate]     Cough and extreme SOB    Family History  Problem Relation Age of Onset  . Heart attack Mother   . Heart disease Mother   . Hypertension Mother   . Heart attack Father   .  Heart disease Father   . Emphysema Father   . Asthma Daughter   . Hypertension Sister   . Cancer Brother   . Asthma Brother   . Emphysema Brother   . Hypertension Sister   . Diabetes Sister   . Kidney disease Sister   . Hypertension Sister   . Cancer Sister   . Hypertension Sister   . Cancer Sister      Prior to Admission medications   Medication Sig Start Date End Date Taking? Authorizing Provider  albuterol (PROVENTIL HFA;VENTOLIN HFA) 108 (90 BASE) MCG/ACT inhaler Inhale 2 puffs into the lungs  every 6 (six) hours as needed for wheezing. 01/25/13 01/25/14 Yes Kathee Delton, MD  apixaban (ELIQUIS) 5 MG TABS tablet Take 1 tablet (5 mg total) by mouth 2 (two) times daily. 08/10/13  Yes Arnoldo Lenis, MD  carboxymethylcellulose (REFRESH PLUS) 0.5 % SOLN Apply 1 drop to eye as needed (for dry eye relief).    Yes Historical Provider, MD  digoxin (LANOXIN) 0.125 MG tablet Take 0.125 mg by mouth every other day.   Yes Historical Provider, MD  diltiazem (CARDIZEM CD) 240 MG 24 hr capsule Take 1 tablet by mouth every morning.  03/18/11  Yes Historical Provider, MD  furosemide (LASIX) 20 MG tablet Take 20 mg by mouth 2 (two) times daily.   Yes Historical Provider, MD  mometasone-formoterol (DULERA) 100-5 MCG/ACT AERO Inhale 2 puffs into the lungs 2 (two) times daily. 07/11/13  Yes Kathee Delton, MD  potassium chloride SA (K-DUR,KLOR-CON) 20 MEQ tablet Take 20 mEq by mouth 2 (two) times daily.   Yes Historical Provider, MD  tiotropium (SPIRIVA) 18 MCG inhalation capsule Place 1 capsule (18 mcg total) into inhaler and inhale daily. 07/24/13  Yes Kathee Delton, MD   Physical Exam: Filed Vitals:   09/18/13 1711 09/18/13 1717 09/18/13 1723 09/18/13 1728  BP: 136/53 117/50 118/57 122/48  Pulse:      Temp:      TempSrc:      Resp:      Height:      Weight:      SpO2:       General: Examined in the emergency department.  Appears calm and comfortableSitting on the side of the bed eating dinner.  Eyes: PERRL, normal lids, irises & conjunctiva ENT: grossly normal hearing, lips Neck: no LAD, masses or thyromegaly Cardiovascular: RRR, no m/r/g. 2-3+ right lower extremity edema, with spontaneous weeping, no ulcers noted. Left lower showed a 3+ edema, chronic venous stasis changes in both legs. Left leg has some resolving erythema and bruising. No evidence of infection. Dorsalis pedis pulses 2+. Perfusion is excellent.  Respiratory:  posterior rales noted. No wheezes or rhonchi. Normal respiratory  effort. Abdomen: soft, ntnd Skin: n as above  Musculoskeletal: grossly normal tone BUE/BLE Psychiatric: grossly normal mood and affect, speech fluent and appropriate. Somewhat hard of hearing.  Neurologic: grossly non-focal.  Wt Readings from Last 3 Encounters:  09/18/13 68.04 kg (150 lb)  08/10/13 67.132 kg (148 lb)  06/20/13 68.584 kg (151 lb 3.2 oz)    Labs on Admission:  Basic Metabolic Panel:  Recent Labs Lab 09/18/13 1557  NA 140  K 4.2  CL 100  CO2 30  GLUCOSE 96  BUN 31*  CREATININE 1.04  CALCIUM 8.5    Liver Function Tests:  Recent Labs Lab 09/18/13 1557  AST 18  ALT 12  ALKPHOS 115  BILITOT 0.9  PROT 7.1  ALBUMIN 3.4*  CBC:  Recent Labs Lab 09/18/13 1557  WBC 16.1*  NEUTROABS 10.9*  HGB 7.9*  HCT 26.5*  MCV 83.6  PLT 121*      Recent Labs  09/18/13 1609  TROPIPOC 0.01     Recent Labs  09/18/13 1557  PROBNP 1451.0*     Radiological Exams on Admission: Dg Chest Port 1 View  09/18/2013   CLINICAL DATA:  Shortness of breath.  EXAM: PORTABLE CHEST - 1 VIEW  COMPARISON:  03/10/2012 and 07/23/2011  FINDINGS: Lungs are adequately inflated and demonstrate hazy mixed interstitial airspace density over the lungs worse in the perihilar regions. There is bibasilar opacification compatible with small to moderate bilateral effusions likely with associated atelectasis. Moderate stable cardiomegaly. Remainder the exam is unchanged.  IMPRESSION: Constellation of findings likely representing congestive heart failure with small to moderate bilateral effusions. Cannot exclude infection.   Electronically Signed   By: Marin Olp M.D.   On: 09/18/2013 16:18    EKG: Independently reviewed. Asthma   Principal Problem:   Acute congestive heart failure Active Problems:   Thrombocytopenia   Chronic anticoagulation   Arteriosclerotic cardiovascular disease (ASCVD)   Chronic atrial fibrillation   COPD (chronic obstructive pulmonary disease)    Chronic respiratory failure with hypoxia   Anemia   Assessment/Plan 1. Acute on chronic hypoxic respiratory failure. Suspect secondary to acute heart failure complicated by anemia, likely subacute. 2. Acute congestive heart failure, last echocardiogram on file reported a normal ejection fraction. Suspect diastolic failure. Currently normotensive, stable oxygenation. Plan admission to the medical floor. No signs or symptoms to suggest ACS. 3. Suspected acute normocytic anemia. Last value on file from July 2014. No obvious bleeding. No history of vaginal bleeding.  4. Leukocytosis. Etiology and significance unclear. No history to suggest infection. 5. Fall 8 days ago with injury to left lower leg. Some increased edema compared to the right, however pulses are intact. I doubt significant hematoma. 6. History of coronary artery disease 7. History of COPD in lifelong nonsmoker. Continue COPD medications.   Admit to telemetry. Diuresis. 2-D echocardiogram. Repeat basic metabolic panel in the morning. Serial troponin. Continue Cardizem.  Discontinue Eliquis. No h/o VTE or stroke. GI consultation the morning. CBC in the morning. Transfuse for hemoglobin less than 7. CBC in the morning.   Code Status: full code, she would like to discuss further with Dr. Willey Blade  DVT prophylaxis: SCDs Family Communication: Status with daughter at bedside  Disposition Plan/Anticipated LOS: Admit 2-3 days   Time spent: 56  minutes  Murray Hodgkins, MD  [facility] Pager [phone] 09/18/2013, 6:33 PM

## 2013-09-19 ENCOUNTER — Encounter (HOSPITAL_COMMUNITY): Payer: Self-pay | Admitting: Gastroenterology

## 2013-09-19 DIAGNOSIS — K922 Gastrointestinal hemorrhage, unspecified: Secondary | ICD-10-CM

## 2013-09-19 DIAGNOSIS — I2510 Atherosclerotic heart disease of native coronary artery without angina pectoris: Secondary | ICD-10-CM

## 2013-09-19 DIAGNOSIS — I359 Nonrheumatic aortic valve disorder, unspecified: Secondary | ICD-10-CM

## 2013-09-19 DIAGNOSIS — J449 Chronic obstructive pulmonary disease, unspecified: Secondary | ICD-10-CM

## 2013-09-19 DIAGNOSIS — D649 Anemia, unspecified: Secondary | ICD-10-CM

## 2013-09-19 DIAGNOSIS — I5033 Acute on chronic diastolic (congestive) heart failure: Principal | ICD-10-CM

## 2013-09-19 DIAGNOSIS — I7090 Unspecified atherosclerosis: Secondary | ICD-10-CM

## 2013-09-19 DIAGNOSIS — I4891 Unspecified atrial fibrillation: Secondary | ICD-10-CM

## 2013-09-19 DIAGNOSIS — I2789 Other specified pulmonary heart diseases: Secondary | ICD-10-CM

## 2013-09-19 LAB — BASIC METABOLIC PANEL
BUN: 31 mg/dL — AB (ref 6–23)
CHLORIDE: 100 meq/L (ref 96–112)
CO2: 31 mEq/L (ref 19–32)
CREATININE: 1.01 mg/dL (ref 0.50–1.10)
Calcium: 8.2 mg/dL — ABNORMAL LOW (ref 8.4–10.5)
GFR calc non Af Amer: 50 mL/min — ABNORMAL LOW (ref >90)
GFR, EST AFRICAN AMERICAN: 58 mL/min — AB (ref >90)
GLUCOSE: 109 mg/dL — AB (ref 70–99)
Potassium: 3.6 mEq/L — ABNORMAL LOW (ref 3.7–5.3)
Sodium: 140 mEq/L (ref 137–147)

## 2013-09-19 LAB — CBC
HCT: 23.5 % — ABNORMAL LOW (ref 36.0–46.0)
Hemoglobin: 7.1 g/dL — ABNORMAL LOW (ref 12.0–15.0)
MCH: 25.3 pg — ABNORMAL LOW (ref 26.0–34.0)
MCHC: 30.2 g/dL (ref 30.0–36.0)
MCV: 83.6 fL (ref 78.0–100.0)
Platelets: 100 K/uL — ABNORMAL LOW (ref 150–400)
RBC: 2.81 MIL/uL — ABNORMAL LOW (ref 3.87–5.11)
RDW: 17.6 % — ABNORMAL HIGH (ref 11.5–15.5)
WBC: 14.3 K/uL — ABNORMAL HIGH (ref 4.0–10.5)

## 2013-09-19 LAB — ABO/RH: ABO/RH(D): A POS

## 2013-09-19 LAB — TROPONIN I
Troponin I: <0.3 ng/mL (ref <0.30)
Troponin I: <0.3 ng/mL (ref <0.30)

## 2013-09-19 LAB — PREPARE RBC (CROSSMATCH)

## 2013-09-19 LAB — DIGOXIN LEVEL: Digoxin Level: 0.6 ng/mL — ABNORMAL LOW (ref 0.8–2.0)

## 2013-09-19 MED ORDER — FUROSEMIDE 10 MG/ML IJ SOLN
40.0000 mg | Freq: Three times a day (TID) | INTRAMUSCULAR | Status: DC
Start: 2013-09-19 — End: 2013-09-24
  Administered 2013-09-19 – 2013-09-24 (×15): 40 mg via INTRAVENOUS
  Filled 2013-09-19 (×15): qty 4

## 2013-09-19 MED ORDER — POTASSIUM CHLORIDE CRYS ER 20 MEQ PO TBCR
20.0000 meq | EXTENDED_RELEASE_TABLET | Freq: Three times a day (TID) | ORAL | Status: AC
  Administered 2013-09-19 – 2013-09-20 (×6): 20 meq via ORAL
  Filled 2013-09-19: qty 2
  Filled 2013-09-19 (×2): qty 1
  Filled 2013-09-19: qty 2
  Filled 2013-09-19 (×2): qty 1

## 2013-09-19 MED ORDER — DEXTROSE 5 % IV SOLN
1.0000 g | INTRAVENOUS | Status: AC
  Administered 2013-09-19 – 2013-09-26 (×8): 1 g via INTRAVENOUS
  Filled 2013-09-19 (×9): qty 10

## 2013-09-19 MED ORDER — TRAZODONE HCL 50 MG PO TABS
50.0000 mg | ORAL_TABLET | Freq: Every evening | ORAL | Status: DC | PRN
  Administered 2013-09-19 – 2013-09-26 (×6): 50 mg via ORAL
  Filled 2013-09-19 (×7): qty 1

## 2013-09-19 MED ORDER — ALBUTEROL SULFATE (2.5 MG/3ML) 0.083% IN NEBU
2.5000 mg | INHALATION_SOLUTION | Freq: Four times a day (QID) | RESPIRATORY_TRACT | Status: DC
Start: 2013-09-19 — End: 2013-09-21
  Administered 2013-09-19 – 2013-09-21 (×8): 2.5 mg via RESPIRATORY_TRACT
  Filled 2013-09-19 (×8): qty 3

## 2013-09-19 MED ORDER — PANTOPRAZOLE SODIUM 40 MG PO TBEC
40.0000 mg | DELAYED_RELEASE_TABLET | Freq: Every day | ORAL | Status: DC
  Administered 2013-09-19 – 2013-09-27 (×9): 40 mg via ORAL
  Filled 2013-09-19 (×9): qty 1

## 2013-09-19 NOTE — Consult Note (Addendum)
Primary cardiologist: Dr. Carlyle Dolly Consulting cardiologist: Dr. Satira Sark  Clinical Summary [name] is an 80 y.o.female admitted to the hospital with recent worsening shortness of breath in the setting of ischemic heart disease, COPD on home oxygen, anemia with hemoglobin 7.9, pro-BNP of 1451, and chest x-ray demonstrating small to moderate bilateral pleural effusions. She did have a fall below for a week ago, has had some left-sided leg edema more so than right.  Patient was last seen by Dr. Harl Bowie in January of this year. At that time medications were adjusted to include a switch from Xarelto to Eliquis, change of digoxin to every other day, otherwise patient stable from ischemic heart disease perspective.  Echocardiogram from September 2012 showed LVEF 50-55%, mild mitral regurgitation, moderate biatrial enlargement, severe pulmonary hypertension with PASP 74 mmHg, very small pericardial effusion.  During hospitalization, Eliquis has been discontinued in light of her anemia and she is written to be transfused PRBCs. GI consultation also obtained with ultimate plan for EGD +/- TCS.   Allergies  Allergen Reactions  . Baycol [Cerivastatin Sodium] Other (See Comments)    Rhabdomyolysis   . Iohexol      Desc: PT GOT VERY SOB DURING CARDIAC CATH IN 1998/ STATES THEY HAD TO STOP DOING THE CATH DUE TO THIS/ ALSO PRODUCED SEVERE HEADACHE FOR WEEKS/ PT REFUSING FURTHER CONTRAST/ TSF   . Shrimp [Shellfish Allergy] Other (See Comments)    gout  . Statins   . Toprol Xl [Metoprolol Succinate]     Cough and extreme SOB    Medications Scheduled Medications: . albuterol  2.5 mg Nebulization QID  . cefTRIAXone (ROCEPHIN)  IV  1 g Intravenous Q24H  . [START ON 09/20/2013] digoxin  0.125 mg Oral QODAY  . diltiazem  240 mg Oral Daily  . furosemide  40 mg Intravenous 3 times per day  . mometasone-formoterol  2 puff Inhalation BID  . pantoprazole  40 mg Oral Daily  .  potassium chloride  20 mEq Oral TID  . sodium chloride  3 mL Intravenous Q12H  . tiotropium  18 mcg Inhalation Daily     PRN Medications: sodium chloride, acetaminophen, albuterol, sodium chloride, traZODone   Past Medical History  Diagnosis Date  . Coronary atherosclerosis of native coronary artery     PTCA LAD 1998; no significant disease in circumflex or RCA 1998  . Chronic atrial fibrillation   . Chronic anticoagulation   . Hyperlipidemia   . Hyperparathyroidism     Parathyroidectomy  . Nephrolithiasis   . Degenerative joint disease     Chronic low back pain; knee pain  . Gout   . Essential hypertension, benign   . COPD (chronic obstructive pulmonary disease)     Past Surgical History  Procedure Laterality Date  . Parathyroidectomy  1970s  . Orif hip fracture  2004    Left hip, following fracture  . Knee arthroscopy      Bilateral  . Kidney stone surgery      x 2  . Total abdominal hysterectomy  Late 1960s  . Colonoscopy  Prior to 2003  . Cardioversion      Family History  Problem Relation Age of Onset  . Heart attack Mother   . Heart disease Mother   . Hypertension Mother   . Heart attack Father   . Heart disease Father   . Emphysema Father   . Asthma Daughter   . Hypertension Sister   . Cancer Brother   .  Asthma Brother   . Emphysema Brother   . Hypertension Sister   . Diabetes Sister   . Kidney disease Sister   . Hypertension Sister   . Cancer Sister   . Hypertension Sister   . Cancer Sister   . Colon cancer Brother     diagnosed in his early 30s    Social History Ms. Sullenger reports that she has never smoked. She does not have any smokeless tobacco history on file. Ms. Hollopeter reports that she does not drink alcohol.  Review of Systems No angina symptoms. Has felt weak and tired. Stable appetite. Has not noticed any obvious bleeding. Otherwise as outlined above.  Physical Examination Blood pressure 113/65, pulse 105, temperature  97.8 F (36.6 C), temperature source Oral, resp. rate 22, height 5' (1.524 m), weight 154 lb 5.2 oz (70 kg), SpO2 94.00%.  Intake/Output Summary (Last 24 hours) at 09/19/13 1121 Last data filed at 09/19/13 0410  Gross per 24 hour  Intake      0 ml  Output    650 ml  Net   -650 ml   Telemetry: Atrial fibrillation.  Frail appearing, pale elderly woman, no distress. HEENT: Conjunctiva and lids normal, oropharynx clear. Neck: Supple, no elevated JVP or carotid bruits, no thyromegaly. Lungs: Decreased breath sounds one third of the way up consistent with effusions,, nonlabored breathing at rest. Cardiac: Distant, irregularly irregular, no S3 or significant systolic murmur, no pericardial rub. Abdomen: Soft, nontender, bowel sounds present, no guarding or rebound. Extremities: Mild edema edema, distal pulses 1-2+. Skin: Warm and dry. Musculoskeletal: Mild kyphosis. Neuropsychiatric: Alert and oriented x3, affect grossly appropriate.   Lab Results  Basic Metabolic Panel:  Recent Labs Lab 09/18/13 1557 09/19/13 0257  NA 140 140  K 4.2 3.6*  CL 100 100  CO2 30 31  GLUCOSE 96 109*  BUN 31* 31*  CREATININE 1.04 1.01  CALCIUM 8.5 8.2*    Liver Function Tests:  Recent Labs Lab 09/18/13 1557  AST 18  ALT 12  ALKPHOS 115  BILITOT 0.9  PROT 7.1  ALBUMIN 3.4*    CBC:  Recent Labs Lab 09/18/13 1557 09/19/13 0257  WBC 16.1* 14.3*  NEUTROABS 10.9*  --   HGB 7.9* 7.1*  HCT 26.5* 23.5*  MCV 83.6 83.6  PLT 121* 100*    Cardiac Enzymes:  Recent Labs Lab 09/18/13 2051 09/19/13 0257 09/19/13 0842  TROPONINI <0.30 <0.30 <0.30    ECG Atrial fibrillation with lead motion artifact, rightward axis, overall nonspecific ST-T changes.  Imaging PORTABLE CHEST - 1 VIEW  COMPARISON: 03/10/2012 and 07/23/2011  FINDINGS:  Lungs are adequately inflated and demonstrate hazy mixed  interstitial airspace density over the lungs worse in the perihilar  regions. There is  bibasilar opacification compatible with small to  moderate bilateral effusions likely with associated atelectasis.  Moderate stable cardiomegaly. Remainder the exam is unchanged.  IMPRESSION:  Constellation of findings likely representing congestive heart  failure with small to moderate bilateral effusions. Cannot exclude  infection.    Impression  1. Presentation with increasing shortness of breath, likely multifactorial in the setting of COPD requiring oxygen, severe pulmonary hypertension, severe anemia, and bilateral pleural effusions. Presumably has a component of diastolic heart failure, last assessment of LVEF was in 2012.  2. Severe anemia, hemoglobin down to 7.1. Eliquis has been held. GI consultation noted, plan for packed red cell transfusions, possibly EGD and TCS.  3. Ischemic heart disease status post remote PTCA to the LAD in  1998, no recent angina, cardiac markers argue against ACS.  4. COPD, oxygen requiring.  5. Chronic atrial fibrillation, presently rate controlled. Anticoagulation being held as noted above.   Recommendations  Followup echocardiogram for reassessment of cardiac structure and function. Agree with treatment with IV Lasix, hopefully pleural effusions will improve in this fashion and not require thoracentesis. Agree with holding anticoagulation at the present time for further GI workup of her severe anemia. Otherwise continue Cardizem CD, Lanoxin, potassium supplements. We will follow with you.  Satira Sark, M.D., F.A.C.C.

## 2013-09-19 NOTE — Progress Notes (Signed)
Utilization Review Complete  

## 2013-09-19 NOTE — Progress Notes (Signed)
  Echocardiogram 2D Echocardiogram has been performed.  Philipp Deputy 09/19/2013, 5:37 PM

## 2013-09-19 NOTE — Consult Note (Addendum)
Referring Provider: Dr. Willey Blade Primary Care Physician:  Dr. Willey Blade Primary Gastroenterologist:  Dr. Gala Romney   Date of Admission: 09/18/13 Date of Consultation: 09/19/13  Reason for Consultation:  Occult GI bleed  HPI:  [name] is an 80 year old female with a history of chronic afib, maintained on Eliquis, presenting with worsening shortness of breath, weakness. New onset anemia with Hgb 7.9 noted on admission. July 2014 revealed normal Hgb at 13. INR 1.99. Hemoccult positive in ED, noted to have dark brown stool. Denies any hematochezia. Believes she saw dark, tarry stool a few days ago. No abdominal pain. No N/V. Poor appetite, doesn't want to eat for "a good while". Nothing tastes good. Early satiety. No dysphagia. No aspirin powders, NSAIDs. Weight stable.   No significant changes in bowel habits. Baseline chronic constipation. Last colonoscopy in remote past. Thinks she may have had an upper endoscopy but not sure. Brother diagnosed with colon cancer in his early 57s.   Last dose of Eliquis yesterday around noon.   Past Medical History  Diagnosis Date  . Arteriosclerotic cardiovascular disease (ASCVD) 1998    80% LAD->PTCA;no significant disease in circumflex or RCA-1998; negative pharmacologic stress nuclear in 8/05  . Chronic atrial fibrillation 1998    DC cardioversion->  Recurrent AF  . Chronic anticoagulation   . Hyperlipidemia   . Hyperparathyroidism     parathyroidectomy  . Nephrolithiasis   . Degenerative joint disease     Chronic low back pain; knee pain  . Gout   . Hypertension     family denies  . COPD (chronic obstructive pulmonary disease)   . CHF (congestive heart failure)     Past Surgical History  Procedure Laterality Date  . Parathyroidectomy  1970s  . Orif hip fracture  2004    Left hip, following fracture  . Knee arthroscopy      bilat.   . Kidney stone surgery      x 2  . Total abdominal hysterectomy  late 1960s  . Coronary stent placement   1998  . Colonoscopy  Prior to 2003  . Cardioversion      Prior to Admission medications   Medication Sig Start Date End Date Taking? Authorizing Provider  albuterol (PROVENTIL HFA;VENTOLIN HFA) 108 (90 BASE) MCG/ACT inhaler Inhale 2 puffs into the lungs every 6 (six) hours as needed for wheezing. 01/25/13 01/25/14 Yes Kathee Delton, MD  apixaban (ELIQUIS) 5 MG TABS tablet Take 1 tablet (5 mg total) by mouth 2 (two) times daily. 08/10/13  Yes Arnoldo Lenis, MD  carboxymethylcellulose (REFRESH PLUS) 0.5 % SOLN Apply 1 drop to eye as needed (for dry eye relief).    Yes Historical Provider, MD  digoxin (LANOXIN) 0.125 MG tablet Take 0.125 mg by mouth every other day.   Yes Historical Provider, MD  diltiazem (CARDIZEM CD) 240 MG 24 hr capsule Take 1 tablet by mouth every morning.  03/18/11  Yes Historical Provider, MD  furosemide (LASIX) 20 MG tablet Take 20 mg by mouth 2 (two) times daily.   Yes Historical Provider, MD  mometasone-formoterol (DULERA) 100-5 MCG/ACT AERO Inhale 2 puffs into the lungs 2 (two) times daily. 07/11/13  Yes Kathee Delton, MD  potassium chloride SA (K-DUR,KLOR-CON) 20 MEQ tablet Take 20 mEq by mouth 2 (two) times daily.   Yes Historical Provider, MD  tiotropium (SPIRIVA) 18 MCG inhalation capsule Place 1 capsule (18 mcg total) into inhaler and inhale daily. 07/24/13  Yes Kathee Delton, MD  Current Facility-Administered Medications  Medication Dose Route Frequency Provider Last Rate Last Dose  . 0.9 %  sodium chloride infusion  250 mL Intravenous PRN Samuella Cota, MD      . acetaminophen (TYLENOL) tablet 500 mg  500 mg Oral QHS PRN Asencion Noble, MD   500 mg at 09/18/13 2251  . albuterol (PROVENTIL) (2.5 MG/3ML) 0.083% nebulizer solution 3 mL  3 mL Inhalation Q6H PRN Samuella Cota, MD      . cefTRIAXone (ROCEPHIN) 1 g in dextrose 5 % 50 mL IVPB  1 g Intravenous Q24H Asencion Noble, MD      . Derrill Memo ON 09/20/2013] digoxin (LANOXIN) tablet 0.125 mg  0.125 mg Oral QODAY Samuella Cota, MD      . diltiazem (CARDIZEM CD) 24 hr capsule 240 mg  240 mg Oral Daily Samuella Cota, MD      . furosemide (LASIX) injection 40 mg  40 mg Intravenous 3 times per day Asencion Noble, MD      . mometasone-formoterol [facility]) 100-5 MCG/ACT inhaler 2 puff  2 puff Inhalation BID Samuella Cota, MD      . potassium chloride SA (K-DUR,KLOR-CON) CR tablet 20 mEq  20 mEq Oral TID Asencion Noble, MD      . sodium chloride 0.9 % injection 3 mL  3 mL Intravenous Q12H Samuella Cota, MD   3 mL at 09/18/13 2208  . sodium chloride 0.9 % injection 3 mL  3 mL Intravenous PRN Samuella Cota, MD      . tiotropium [facility]) inhalation capsule 18 mcg  18 mcg Inhalation Daily Samuella Cota, MD      . traZODone (DESYREL) tablet 50 mg  50 mg Oral QHS PRN Asencion Noble, MD        Allergies as of 09/18/2013 - Review Complete 09/18/2013  Allergen Reaction Noted  . Baycol [cerivastatin sodium] Other (See Comments) 04/22/2011  . Iohexol  10/17/2009  . Shrimp [shellfish allergy] Other (See Comments) 05/27/2011  . Statins  06/20/2012  . Toprol xl [metoprolol succinate]  03/31/2011    Family History  Problem Relation Age of Onset  . Heart attack Mother   . Heart disease Mother   . Hypertension Mother   . Heart attack Father   . Heart disease Father   . Emphysema Father   . Asthma Daughter   . Hypertension Sister   . Cancer Brother   . Asthma Brother   . Emphysema Brother   . Hypertension Sister   . Diabetes Sister   . Kidney disease Sister   . Hypertension Sister   . Cancer Sister   . Hypertension Sister   . Cancer Sister   . Colon cancer Brother     diagnosed in his early 65s    History   Social History  . Marital Status: Married    Spouse Name: Iona Beard    Number of Children: Y  . Years of Education: N/A   Occupational History  . retired.  prev worked at [employer] x 6 yrs    Social History Main Topics  . Smoking status: Never Smoker   . Smokeless tobacco: Not on file  .  Alcohol Use: No  . Drug Use: No  . Sexual Activity: Not on file   Other Topics Concern  . Not on file   Social History Narrative  . No narrative on file    Review of Systems: Gen: see HPI CV: +palpitations Resp: +SOB,  on 2 liters O2 at home GI: see HPI  GU : Denies urinary burning, urinary frequency, urinary incontinence.  MS: +knee, ankle pain.  Derm: weeping edema lower extremities  Psych: Denies depression, anxiety,confusion, or memory loss Heme: Denies bruising, bleeding, and enlarged lymph nodes.  Physical Exam: Vital signs in last 24 hours: Temp:  [97 F (36.1 C)-97.9 F (36.6 C)] 97.8 F (36.6 C) (03/03 0419) Pulse Rate:  [96-106] 105 (03/03 0419) Resp:  [22-27] 22 (03/03 0419) BP: (113-142)/(48-67) 113/65 mmHg (03/03 0419) SpO2:  [95 %-100 %] 98 % (03/03 0419) FiO2 (%):  [4 %] 4 % (03/03 0419) Weight:  [150 lb (68.04 kg)-155 lb 6.4 oz (70.489 kg)] 154 lb 5.2 oz (70 kg) (03/03 0500) Last BM Date: 09/16/13 General:   Alert,  Well-developed, well-nourished, pleasant and cooperative  Head:  Normocephalic and atraumatic. Eyes:  Sclera clear, no icterus.   Conjunctiva pink. Ears:  Mildly HOH Mouth:  No deformity or lesions Neck:  Supple; no masses or thyromegaly. Lungs:  Tachypnea, diminished bases, coarse bilaterally Heart:  S1 S2 present, irregular Abdomen:  Distended but soft, +BS, non-tender, question left flank hernia, no obvious HSM Rectal:  Deferred until time of colonoscopy.   Msk:  Mild kyphosis Extremities:  Bilateral lower extremities with chronic venous stasis changes, weeping distal extremities, pitting edema Neurologic:  Alert and  oriented x4;  grossly normal neurologically. Skin:  Intact without significant lesions or rashes. Cervical Nodes:  No significant cervical adenopathy. Psych:  Alert and cooperative. Normal mood and affect.  Intake/Output from previous day: 03/02 0701 - 03/03 0700 In: -  Out: 650 [Urine:650] Intake/Output this shift:     Lab Results:  Recent Labs  09/18/13 1557 09/19/13 0257  WBC 16.1* 14.3*  HGB 7.9* 7.1*  HCT 26.5* 23.5*  PLT 121* 100*   BMET  Recent Labs  09/18/13 1557 09/19/13 0257  NA 140 140  K 4.2 3.6*  CL 100 100  CO2 30 31  GLUCOSE 96 109*  BUN 31* 31*  CREATININE 1.04 1.01  CALCIUM 8.5 8.2*   LFT  Recent Labs  09/18/13 1557  PROT 7.1  ALBUMIN 3.4*  AST 18  ALT 12  ALKPHOS 115  BILITOT 0.9   PT/INR  Recent Labs  09/18/13 1557  LABPROT 22.0*  INR 1.99*    Studies/Results: Dg Chest Port 1 View  09/18/2013   CLINICAL DATA:  Shortness of breath.  EXAM: PORTABLE CHEST - 1 VIEW  COMPARISON:  03/10/2012 and 07/23/2011  FINDINGS: Lungs are adequately inflated and demonstrate hazy mixed interstitial airspace density over the lungs worse in the perihilar regions. There is bibasilar opacification compatible with small to moderate bilateral effusions likely with associated atelectasis. Moderate stable cardiomegaly. Remainder the exam is unchanged.  IMPRESSION: Constellation of findings likely representing congestive heart failure with small to moderate bilateral effusions. Cannot exclude infection.   Electronically Signed   By: Marin Olp M.D.   On: 09/18/2013 16:18    Impression: 80 year old female admitted with worsening shortness of breath, weakness, and new-onset anemia without overt GI bleeding in the setting of Eliquis for afib. Heme positive in ED. Acute on chronic respiratory failure, acute CHF in the setting of anemia. Serial troponins ordered, 1 unit PRBCs ordered.  With her presentation and presence of anticoagulation, etiology of occult GI bleed could be present anywhere in GI tract but favor more proximal origin, unable to exclude small bowel etiology. Also notable, she has a family history of colon cancer in  first degree relative and is overdue for colonoscopy. Last EGD possibly in remote past, but this is unclear.   Agree with 1 unit PRBCs. As she is not  overtly bleeding, recommend supportive measures to improve respiratory function, cardiology consultation as ordered, with plans to proceed with definite EGD+/-TCS in next 24-48 hours. Discussed this in detail with patient and her daughter at bedside. Risks and benefits discussed with stated understanding. As of note, last dose of Eliquis 3/2.   Plan: Continue to hold anticoagulation Agree with transfusion Monitor H/H, overt signs of GI bleeding Add PPI for GI prophylaxis Clear liquids now Await cardiology recommendations Anticipate EGD+/-TCS in next 24-48 hours depending on respiratory/cardiac status.    Thank you for the opportunity to assist in the care of this nice lady.   Orvil Feil, ANP-BC [facility]    LOS: 1 day    09/19/2013, 8:54 AM   Attending note:  Patient seen and examined. Agree with need for endoscopic evaluation. I have offered the patient both an EGD and colonoscopy on March 5. The risks,  benefits, limitations, imponderables and alternatives regarding both EGD and colonoscopy have been reviewed with the patient . Questions have been answered. All parties agreeable.

## 2013-09-20 LAB — BASIC METABOLIC PANEL
BUN: 25 mg/dL — ABNORMAL HIGH (ref 6–23)
CHLORIDE: 104 meq/L (ref 96–112)
CO2: 30 meq/L (ref 19–32)
CREATININE: 0.88 mg/dL (ref 0.50–1.10)
Calcium: 8 mg/dL — ABNORMAL LOW (ref 8.4–10.5)
GFR calc Af Amer: 68 mL/min — ABNORMAL LOW (ref >90)
GFR calc non Af Amer: 59 mL/min — ABNORMAL LOW (ref >90)
GLUCOSE: 121 mg/dL — AB (ref 70–99)
Potassium: 4 mEq/L (ref 3.7–5.3)
Sodium: 144 mEq/L (ref 137–147)

## 2013-09-20 LAB — CBC
HEMATOCRIT: 29.3 % — AB (ref 36.0–46.0)
Hemoglobin: 8.6 g/dL — ABNORMAL LOW (ref 12.0–15.0)
MCH: 25.3 pg — ABNORMAL LOW (ref 26.0–34.0)
MCHC: 29.4 g/dL — ABNORMAL LOW (ref 30.0–36.0)
MCV: 86.2 fL (ref 78.0–100.0)
Platelets: 89 10*3/uL — ABNORMAL LOW (ref 150–400)
RBC: 3.4 MIL/uL — ABNORMAL LOW (ref 3.87–5.11)
RDW: 17.2 % — ABNORMAL HIGH (ref 11.5–15.5)
WBC: 12.9 10*3/uL — AB (ref 4.0–10.5)

## 2013-09-20 MED ORDER — PEG 3350-KCL-NA BICARB-NACL 420 G PO SOLR
2000.0000 mL | Freq: Once | ORAL | Status: AC
  Administered 2013-09-21: 2000 mL via ORAL
  Filled 2013-09-20: qty 4000

## 2013-09-20 MED ORDER — PEG 3350-KCL-NA BICARB-NACL 420 G PO SOLR
2000.0000 mL | Freq: Once | ORAL | Status: AC
  Administered 2013-09-20: 2000 mL via ORAL
  Filled 2013-09-20: qty 4000

## 2013-09-20 NOTE — Progress Notes (Signed)
Consulting cardiologist: Rozann Lesches MD Primary Cardiologist: Carlyle Dolly MD  Subjective:    Frail, tired, easily short of breath.   Objective:   Temp:  [97.5 F (36.4 C)-98.1 F (36.7 C)] 97.7 F (36.5 C) (03/04 0501) Pulse Rate:  [89-109] 96 (03/04 1124) Resp:  [20-26] 20 (03/04 0501) BP: (115-147)/(45-64) 115/64 mmHg (03/04 1124) SpO2:  [90 %-98 %] 96 % (03/04 1124) Weight:  [158 lb 1.1 oz (71.7 kg)] 158 lb 1.1 oz (71.7 kg) (03/04 0505) Last BM Date: 09/16/13  Filed Weights   09/18/13 1939 09/19/13 0500 09/20/13 0505  Weight: 155 lb 6.4 oz (70.489 kg) 154 lb 5.2 oz (70 kg) 158 lb 1.1 oz (71.7 kg)    Intake/Output Summary (Last 24 hours) at 09/20/13 1126 Last data filed at 09/20/13 1610  Gross per 24 hour  Intake    415 ml  Output   1750 ml  Net  -1335 ml    Telemetry: Atrial fibrillation.  Exam:  General: No acute distress.  Lungs: Clear to auscultation, non-labored, mildly diminished in the bases.  Cardiac: No elevated JVP or bruits. IRRR, no gallop or rub.   Abdomen: Normoactive bowel sounds, nontender, nondistended.  Extremities: Erythematous thickened skin, with bilateral edema.   Lab Results:  Basic Metabolic Panel:  Recent Labs Lab 09/18/13 1557 09/19/13 0257 09/20/13 0541  NA 140 140 144  K 4.2 3.6* 4.0  CL 100 100 104  CO2 30 31 30   GLUCOSE 96 109* 121*  BUN 31* 31* 25*  CREATININE 1.04 1.01 0.88  CALCIUM 8.5 8.2* 8.0*    Liver Function Tests:  Recent Labs Lab 09/18/13 1557  AST 18  ALT 12  ALKPHOS 115  BILITOT 0.9  PROT 7.1  ALBUMIN 3.4*    CBC:  Recent Labs Lab 09/18/13 1557 09/19/13 0257 09/20/13 0541  WBC 16.1* 14.3* 12.9*  HGB 7.9* 7.1* 8.6*  HCT 26.5* 23.5* 29.3*  MCV 83.6 83.6 86.2  PLT 121* 100* 89*    Cardiac Enzymes:  Recent Labs Lab 09/18/13 2051 09/19/13 0257 09/19/13 0842  TROPONINI <0.30 <0.30 <0.30    BNP:  Recent Labs  09/18/13 1557  PROBNP 1451.0*     Coagulation:  Recent Labs Lab 09/18/13 1557  INR 1.99*    Echocardiogram (3/3): Study Conclusions  - Left ventricle: The cavity size was normal. Wall thickness was increased in a pattern of mild LVH. Systolic function was normal. The estimated ejection fraction was in the range of 60% to 65%. Wall motion was normal; there were no regional wall motion abnormalities. The study is not technically sufficient to allow evaluation of LV diastolic function. - Aortic valve: Mildly to moderately calcified annulus. Trileaflet; mildly calcified leaflets. Cusp separation was reduced. There was mild to moderate stenosis. No significant regurgitation. Mean gradient: 84mm Hg (S). Peak gradient: 35mm Hg (S). VTI ratio of LVOT to aortic valve: 0.52. Valve area: 1.46cm^2(VTI). Valve area: 1.38cm^2 (Vmax). - Mitral valve: Calcified annulus. Mildly thickened leaflets . Moderate regurgitation directed posteriorly. - Left atrium: The atrium was severely dilated. - Right ventricle: The cavity size was moderately dilated. Systolic function was mildly to moderately reduced. - Right atrium: The atrium was mildly to moderately dilated. Central venous pressure: 101mm Hg (est). - Tricuspid valve: Mild regurgitation. - Pulmonary arteries: Systolic pressure was severely increased. PA peak pressure: 41mm Hg (S). - Pericardium, extracardiac: There was no pericardial Effusion.  Impressions:  - Mild LVH with LVEF 60-65%, indeterminate diastolic dysfunction. Severe left atrial enlargement.  MAC with thickened mitral leaflets and moderate posteriorly directed mitral regurgitation. Mild to moderate aortic stenosis. Moderate RV dilatation with reduced contraction. Mild tricuspid regurgitation with severe pulmonary hypertension, PASP 72 mmHg.   Medications:   Scheduled Medications: . albuterol  2.5 mg Nebulization QID  . cefTRIAXone (ROCEPHIN)  IV  1 g Intravenous Q24H  . digoxin  0.125 mg Oral QODAY   . diltiazem  240 mg Oral Daily  . furosemide  40 mg Intravenous 3 times per day  . mometasone-formoterol  2 puff Inhalation BID  . pantoprazole  40 mg Oral Daily  . polyethylene glycol-electrolytes  2,000 mL Oral Once  . [START ON 09/21/2013] polyethylene glycol-electrolytes  2,000 mL Oral Once  . potassium chloride  20 mEq Oral TID  . sodium chloride  3 mL Intravenous Q12H  . tiotropium  18 mcg Inhalation Daily      PRN Medications:  sodium chloride, acetaminophen, albuterol, sodium chloride, traZODone   Assessment and Plan:   1. Chronic Atrial fibrillation: Rate controlled. Anticoagulation held in the setting of anemia.  She scheduled for EGD and colonoscopy in am. She remains on CCB and digoxin.   2. COPD: Remains on O2 easily dyspneac with minimal exertion. She has severe pulmonary hypertension by recent echocardiogram. Breathing status tenuous.   3. Diastolic CHF: Likely also complicated by valvular heart disease including mitral regurgitation and aortic stenosis. She is diuresing 1.3 liters overnight on IV lasix 40 mg Q 8 hours. Creatinine 0.88 this am. BP is low normal for sedentary state. Still with LEE on assessment.   4. Anemia: Improved from admission with blood transfusion by one gram. Holding anticoagulation with planned colonoscopy and EDG in am per GI.  5. Ischemic heart disease: Status post remote PTCA to the LAD in 1998, no recent angina, cardiac markers argue against ACS.   Jory Sims NP   Attending note:  Patient remains short of breath. Degree of anemia has improved following packed red cell transfusion. She has diuresed on IV Lasix. Followup echocardiogram shows normal LVEF with severe left atrial enlargement, moderate mitral regurgitation, and mild to moderate aortic stenosis. More importantly, she has severe pulmonary hypertension, and with her oxygen dependent COPD, dyspnea will remain an issue. Hopefully her pleural effusions will improve with  further diuresis. Anticoagulation remains on hold pending further GI workup.  Satira Sark, M.D., F.A.C.C.

## 2013-09-20 NOTE — Progress Notes (Signed)
Case manager stated that Dr. Willey Blade wanted patient to receive one unit of PRBCs today.  No order in chart.  RN called Dr. Willey Blade at his office and received order for patient to receive one unit of PRBCs today.  Dr. Willey Blade stated to give Lasix dose as ordered.  Orders followed.

## 2013-09-20 NOTE — Progress Notes (Addendum)
Subjective: Denies abdominal pain, N/V, overt GI bleeding.   Objective: Vital signs in last 24 hours: Temp:  [97.5 F (36.4 C)-98.1 F (36.7 C)] 97.7 F (36.5 C) (03/04 0501) Pulse Rate:  [89-109] 109 (03/04 0501) Resp:  [20-26] 20 (03/04 0501) BP: (115-147)/(45-57) 147/57 mmHg (03/04 0501) SpO2:  [91 %-98 %] 91 % (03/04 0501) Weight:  [158 lb 1.1 oz (71.7 kg)] 158 lb 1.1 oz (71.7 kg) (03/04 0505) Last BM Date: 09/16/13 General:   Alert and oriented, pleasant Heart:  S1, S2 present, irregularly irregular Lungs: scattered rhonchi Abdomen:  Bowel sounds present, soft, non-tender, non-distended. Question left flank hernia Extremities:  Bilateral lower extremity edema, gauze to bilateral lower extremities.  Neurologic:  Alert and  oriented x4;  grossly normal neurologically. Skin:  Warm and dry, intact without significant lesions.  Psych:  Alert and cooperative. Normal mood and affect.  Intake/Output from previous day: 03/03 0701 - 03/04 0700 In: 415 [P.O.:120; Blood:245; IV Piggyback:50] Out: 9735 [Urine:1750] Intake/Output this shift:    Lab Results:  Recent Labs  09/18/13 1557 09/19/13 0257 09/20/13 0541  WBC 16.1* 14.3* 12.9*  HGB 7.9* 7.1* 8.6*  HCT 26.5* 23.5* 29.3*  PLT 121* 100* 89*   BMET  Recent Labs  09/18/13 1557 09/19/13 0257 09/20/13 0541  NA 140 140 144  K 4.2 3.6* 4.0  CL 100 100 104  CO2 30 31 30   GLUCOSE 96 109* 121*  BUN 31* 31* 25*  CREATININE 1.04 1.01 0.88  CALCIUM 8.5 8.2* 8.0*   LFT  Recent Labs  09/18/13 1557  PROT 7.1  ALBUMIN 3.4*  AST 18  ALT 12  ALKPHOS 115  BILITOT 0.9   PT/INR  Recent Labs  09/18/13 1557  LABPROT 22.0*  INR 1.99*     Studies/Results: Dg Chest Port 1 View  09/18/2013   CLINICAL DATA:  Shortness of breath.  EXAM: PORTABLE CHEST - 1 VIEW  COMPARISON:  03/10/2012 and 07/23/2011  FINDINGS: Lungs are adequately inflated and demonstrate hazy mixed interstitial airspace density over the lungs  worse in the perihilar regions. There is bibasilar opacification compatible with small to moderate bilateral effusions likely with associated atelectasis. Moderate stable cardiomegaly. Remainder the exam is unchanged.  IMPRESSION: Constellation of findings likely representing congestive heart failure with small to moderate bilateral effusions. Cannot exclude infection.   Electronically Signed   By: Marin Olp M.D.   On: 09/18/2013 16:18    Assessment: 80 year old female admitted with worsening shortness of breath, weakness, and new-onset anemia without overt GI bleeding in the setting of Eliquis for afib. Heme positive in ED. Acute on chronic respiratory failure, acute CHF in the setting of anemia. Received 1 unit PRBCs with modest improvement in Hgb; an additional unit ordered.   With her presentation and presence of anticoagulation, etiology of occult GI bleed could be present anywhere in GI tract but favor more proximal origin, unable to exclude small bowel etiology. Also notable, she has a family history of colon cancer in first degree relative and is overdue for colonoscopy. Last EGD possibly in remote past, but this is unclear.   She has improved from a respiratory standpoint since admission; cardiology consult reviewed and appreciated. We will plan on proceeding with an upper endoscopy and colonoscopy on 3/5 with Dr. Gala Romney. Will complete a split prep, with 2 liters this evening and 2 liters tomorrow. Patient states understanding regarding risks and benefits; she is willing to proceed.    Plan: Clear liquids today  2 liters [place] this evening followed by an additional 2 on 3/5 EGD/TCS with Dr. Gala Romney on 3/5 Agree with one additional unit PRBCs Continue to follow H/H Continue PPI for GI prophylaxis  Orvil Feil, ANP-BC [facility]astroenterology    LOS: 2 days    09/20/2013, 7:45 AM   Attending note: Patient seen and examined. Agree with above impression and  recommendations

## 2013-09-20 NOTE — Care Management Note (Signed)
    Page 1 of 1   09/22/2013     1:48:42 PM   CARE MANAGEMENT NOTE 09/22/2013  Patient:  [name], [name]   Account Number:  [id number]  Date Initiated:  09/20/2013  Documentation initiated by:  Claretha Cooper  Subjective/Objective Assessment:   Pt from home where she lives with her husband. Declines HH or DME needs. On chronic O2 at home from [place]     Action/Plan:   Reminded pt and husband to bring O2 canister from home when [place]   Anticipated DC Date:  09/22/2013   Anticipated DC Plan:  [place]  CM consult      Choice offered to / List presented to:          [facility] arranged  HH-1 RN  [place].   Status of service:  Completed, signed off Medicare Important Message given?   (If response is "NO", the following Medicare IM given date fields will be blank) Date Medicare IM given:   Date Additional Medicare IM given:    Discharge Disposition:    Per UR Regulation:    If discussed at Long Length of Stay Meetings, dates discussed:    Comments:  09/20/13 Claretha Cooper RN BSN CM

## 2013-09-21 ENCOUNTER — Encounter (HOSPITAL_COMMUNITY): Payer: Self-pay | Admitting: *Deleted

## 2013-09-21 ENCOUNTER — Encounter (HOSPITAL_COMMUNITY)
Admission: EM | Disposition: A | Discharge status: Skilled Nursing Facility | Payer: Self-pay | Source: Home / Self Care | Attending: Internal Medicine

## 2013-09-21 DIAGNOSIS — D649 Anemia, unspecified: Secondary | ICD-10-CM

## 2013-09-21 DIAGNOSIS — K922 Gastrointestinal hemorrhage, unspecified: Secondary | ICD-10-CM

## 2013-09-21 DIAGNOSIS — D128 Benign neoplasm of rectum: Secondary | ICD-10-CM

## 2013-09-21 DIAGNOSIS — D126 Benign neoplasm of colon, unspecified: Secondary | ICD-10-CM

## 2013-09-21 DIAGNOSIS — D129 Benign neoplasm of anus and anal canal: Secondary | ICD-10-CM

## 2013-09-21 HISTORY — PX: COLONOSCOPY WITH ESOPHAGOGASTRODUODENOSCOPY (EGD): SHX5779

## 2013-09-21 LAB — BASIC METABOLIC PANEL
BUN: 22 mg/dL (ref 6–23)
CHLORIDE: 100 meq/L (ref 96–112)
CO2: 31 meq/L (ref 19–32)
Calcium: 7.9 mg/dL — ABNORMAL LOW (ref 8.4–10.5)
Creatinine, Ser: 0.82 mg/dL (ref 0.50–1.10)
GFR calc Af Amer: 74 mL/min — ABNORMAL LOW (ref >90)
GFR calc non Af Amer: 64 mL/min — ABNORMAL LOW (ref >90)
GLUCOSE: 115 mg/dL — AB (ref 70–99)
POTASSIUM: 3.7 meq/L (ref 3.7–5.3)
SODIUM: 144 meq/L (ref 137–147)

## 2013-09-21 LAB — TYPE AND SCREEN
ABO/RH(D): A POS
Antibody Screen: NEGATIVE
Unit division: 0
Unit division: 0

## 2013-09-21 LAB — CBC
HCT: 29.9 % — ABNORMAL LOW (ref 36.0–46.0)
HEMOGLOBIN: 9 g/dL — AB (ref 12.0–15.0)
MCH: 25.8 pg — AB (ref 26.0–34.0)
MCHC: 30.1 g/dL (ref 30.0–36.0)
MCV: 85.7 fL (ref 78.0–100.0)
PLATELETS: 97 10*3/uL — AB (ref 150–400)
RBC: 3.49 MIL/uL — ABNORMAL LOW (ref 3.87–5.11)
RDW: 16.9 % — ABNORMAL HIGH (ref 11.5–15.5)
WBC: 14.3 10*3/uL — AB (ref 4.0–10.5)

## 2013-09-21 LAB — URINE CULTURE: Colony Count: 70000

## 2013-09-21 SURGERY — COLONOSCOPY WITH ESOPHAGOGASTRODUODENOSCOPY (EGD)
Anesthesia: Moderate Sedation

## 2013-09-21 MED ORDER — ALBUTEROL SULFATE (2.5 MG/3ML) 0.083% IN NEBU
2.5000 mg | INHALATION_SOLUTION | Freq: Three times a day (TID) | RESPIRATORY_TRACT | Status: DC
  Administered 2013-09-21 – 2013-09-27 (×17): 2.5 mg via RESPIRATORY_TRACT
  Filled 2013-09-21 (×19): qty 3

## 2013-09-21 MED ORDER — ONDANSETRON HCL 4 MG/2ML IJ SOLN
INTRAMUSCULAR | Status: DC | PRN
  Administered 2013-09-21: 4 mg via INTRAVENOUS

## 2013-09-21 MED ORDER — MIDAZOLAM HCL 5 MG/5ML IJ SOLN
INTRAMUSCULAR | Status: DC | PRN
  Administered 2013-09-21: 1 mg via INTRAVENOUS
  Administered 2013-09-21: 0.5 mg via INTRAVENOUS

## 2013-09-21 MED ORDER — POTASSIUM CHLORIDE CRYS ER 20 MEQ PO TBCR
20.0000 meq | EXTENDED_RELEASE_TABLET | Freq: Three times a day (TID) | ORAL | Status: AC
  Administered 2013-09-21 – 2013-09-22 (×6): 20 meq via ORAL
  Filled 2013-09-21 (×7): qty 1

## 2013-09-21 MED ORDER — LIDOCAINE VISCOUS 2 % MT SOLN
OROMUCOSAL | Status: AC
  Filled 2013-09-21: qty 15

## 2013-09-21 MED ORDER — STERILE WATER FOR IRRIGATION IR SOLN
Status: DC | PRN
  Administered 2013-09-21: 14:00:00

## 2013-09-21 MED ORDER — MEPERIDINE HCL 100 MG/ML IJ SOLN
INTRAMUSCULAR | Status: DC | PRN
  Administered 2013-09-21: 12.5 mg via INTRAVENOUS

## 2013-09-21 MED ORDER — ONDANSETRON HCL 4 MG/2ML IJ SOLN
INTRAMUSCULAR | Status: AC
Start: 2013-09-21 — End: 2013-09-22
  Filled 2013-09-21: qty 2

## 2013-09-21 MED ORDER — MIDAZOLAM HCL 5 MG/5ML IJ SOLN
INTRAMUSCULAR | Status: AC
  Filled 2013-09-21: qty 10

## 2013-09-21 MED ORDER — LIDOCAINE VISCOUS 2 % MT SOLN
OROMUCOSAL | Status: DC | PRN
  Administered 2013-09-21: 1 via OROMUCOSAL

## 2013-09-21 MED ORDER — MEPERIDINE HCL 100 MG/ML IJ SOLN
INTRAMUSCULAR | Status: AC
  Filled 2013-09-21: qty 2

## 2013-09-21 NOTE — Progress Notes (Signed)
[name], [name]:  [id number]  MEDICAL RECORD NO.:  [id number]  LOCATION:                                 FACILITY:  PHYSICIAN:  Paula Compton. Willey Blade, MD       DATE OF BIRTH:  [birth year]  DATE OF PROCEDURE:  09/20/2013 DATE OF DISCHARGE:                                PROGRESS NOTE   Ms. Pester feels that her breathing is improved.  She feels that her leg swelling is improved.  She has diuresed 1750 mL.  She tolerated her transfusion of 1 unit of packed red cells without complication.  OBJECTIVE:  VITAL SIGNS:  Afebrile with a temperature 97.7, heart rate is in the 90s on telemetry, respirations 20, blood pressure 147/57, oxygen saturation 91%. LUNGS:  Bilateral rales. HEART:  Irregularly irregular with a grade 2 systolic murmur. ABDOMEN:  Soft and nontender. EXTREMITIES:  Improved edema in the feet and lower legs.  IMPRESSION/PLAN: 1. Acute on chronic diastolic heart failure improving.  Continue IV     Lasix.  Echocardiogram reveals an ejection fraction of 60-65% with     mild left ventricular hypertrophy.  There are no wall motion     abnormalities.  She has mild to moderate aortic stenosis.  She has     moderate mitral regurgitation. 2. Anemia/heme-positive stool.  She has been seen by Gastroenterology     and is tentatively scheduled for colonoscopy and upper endoscopy     tomorrow.  Her hemoglobin is up to 8.6 after 1 unit of packed red     cells.  She will be given a 2nd unit today. 3. Hypokalemia, resolved.  Potassium is up from 3.6 to 4.0. 4. Urinary tract infection.  Continue ceftriaxone.  White count has     dropped from 16.1 to 14.3 to 12.9.  Urine culture pending. 5. Thrombocytopenia.  Platelet count is trended down from 121,000 to     100,000 to 89,000.  Continue observation. 6. Anticoagulation.  Eliquis remains on hold. 7. Chronic atrial fibrillation.  Continue diltiazem and digoxin. 8. Chronic obstructive pulmonary disease.  Continue  albuterol nebs     with Spiriva and Dulera.     Paula Compton. Willey Blade, MD     ROF/MEDQ  D:  09/20/2013  T:  09/21/2013  Job:  [id number]

## 2013-09-21 NOTE — Progress Notes (Signed)
Patient has finished 3/4 of prep, tolerated well but refused to drink the rest. Patient received a tap water enema, Stool is clear, few small flakes noted, tolerated well.

## 2013-09-21 NOTE — Op Note (Signed)
[name], [id number]   ENDOSCOPY PROCEDURE REPORT  PATIENT: Jenna, [name]  MR#: [id number] BIRTHDATE: 06-17-1927 , 39  yrs. old GENDER: Female ENDOSCOPIST: R.  Garfield Cornea, MD FACP FACG REFERRED BY:  Asencion Noble, M.D.  Rozann Lesches, M.D. PROCEDURE DATE:  09/21/2013 PROCEDURE:     Diagnostic EGD  INDICATIONS:     Anemia; occult GI bleed/Hemoccult positive on Eliquis (held)  INFORMED CONSENT:   The risks, benefits, limitations, alternatives and imponderables have been discussed.  The potential for biopsy, esophogeal dilation, etc. have also been reviewed.  Questions have been answered.  All parties agreeable.  Please see the history and physical in the medical record for more information.  MEDICATIONS:  Versed 1 mg IV and Demerol 12.5 mg IV. Xylocaine gel. Zofran 4 mg IV  DESCRIPTION OF PROCEDURE:   The EG-[id number] ([id number])  endoscope was introduced through the mouth and advanced to the second portion of the duodenum without difficulty or limitations.  The mucosal surfaces were surveyed very carefully during advancement of the scope and upon withdrawal.  Retroflexion view of the proximal stomach and esophagogastric junction was performed.      FINDINGS:   Normal esophagus. Stomach empty. Small hiatal hernia. Diffuse patchy gastric erythema with superficial antral erosions.. No ulcer or infiltrating process. Patent pylorus. Normal first and second portion of the duodenum.  THERAPEUTIC / DIAGNOSTIC MANEUVERS PERFORMED:  None   COMPLICATIONS:  None  IMPRESSION:  Hiatal hernia. Gastric  erosions   RECOMMENDATIONS:  Check H. pylori serologies. See colonoscopy report.    _______________________________ R. Garfield Cornea, MD FACP [facility] eSigned:  R. Garfield Cornea, MD FACP [facility] 09/21/2013 2:13 PM     CC:  PATIENT NAME:  Jenna, [name] MR#: [id number]

## 2013-09-21 NOTE — Progress Notes (Signed)
Patient has drank about half of prep this morning and received 2 tap water enemas.  Stool is clear with blood tinge.  Few small flakes noted.  Patient continues to drink prep.

## 2013-09-21 NOTE — Op Note (Signed)
[place], [id number]   COLONOSCOPY PROCEDURE REPORT  PATIENT: Jenna, [name]  MR#:         [id number] BIRTHDATE: 03-26-1927 , 80  yrs. old GENDER: Female ENDOSCOPIST: R.  Garfield Cornea, MD FACP FACG REFERRED BY:  Asencion Noble, M.D.  Rozann Lesches, M.D. PROCEDURE DATE:  09/21/2013 PROCEDURE:     Colonoscopy with biopsy and multiple snare polypectomies  INDICATIONS: anemia; Hemoccult positive stool; positive family history colon cancer  INFORMED CONSENT:  The risks, benefits, alternatives and imponderables including but not limited to bleeding, perforation as well as the possibility of a missed lesion have been reviewed.  The potential for biopsy, lesion removal, etc. have also been discussed.  Questions have been answered.  All parties agreeable. Please see the history and physical in the medical record for more information.  MEDICATIONS: Versed 1.5 mg IV and Demerol 25 mg IV in divided doses. Zofran 4 mg IV.  DESCRIPTION OF PROCEDURE:  After a digital rectal exam was performed, the EC-[id number] ([id number])  colonoscope was advanced from the anus through the rectum and colon to the area of the cecum, ileocecal valve and appendiceal orifice.  The cecum was deeply intubated.  These structures were well-seen and photographed for the record.  From the level of the cecum and ileocecal valve, the scope was slowly and cautiously withdrawn.  The mucosal surfaces were carefully surveyed utilizing scope tip deflection to facilitate fold flattening as needed.  The scope was pulled down into the rectum where a thorough examination including retroflexion was performed.    FINDINGS:  Adequate preparation. In the rectum, the patient had (1) 1.5 x 1.8 cm somewhat pedunculated polyp in at 5 cm from the anal verge. It was spontaneously oozing. Please see above photos; otherwise, the remainder of the rectal mucosa appeared normal. The patient has  scattered left-sided diverticula; patient had multiple polyps in the ascending, descending and sigmoid segments; the largest polyp was 8 mm in dimensions.  THERAPEUTIC / DIAGNOSTIC MANEUVERS PERFORMED:  The above-mentioned colon polyps were either cold or hot snared/removed or cold biopsied/removed. The rectal polyp was engaged with the snare and removed cleanly with one pass.  Subsequently, the mucosal defect was closed with 3 indistinct clips. The polypectomy site was tapped he is well.  COMPLICATIONS: None  CECAL WITHDRAWAL TIME:  20 minute  IMPRESSION:  Multiple colonic and rectal polyps-removed as described above; the large rectal polyp was actively oozing- status post removal of all polyps seen. Status post clipping and tattooing of rectal polypectomy site.    RECOMMENDATIONS:   Would withhold anticoagulation for at least another 10 days. Will advance diet. No MRI until clips gone.  See EGD report   _______________________________ eSigned:  R. Garfield Cornea, MD FACP [facility] 09/21/2013 2:59 PM   CC:    PATIENT NAME:  Jenna, [name] MR#: [id number]

## 2013-09-22 ENCOUNTER — Inpatient Hospital Stay (HOSPITAL_COMMUNITY): Payer: Medicare Other

## 2013-09-22 ENCOUNTER — Encounter (HOSPITAL_COMMUNITY): Payer: Self-pay | Admitting: Internal Medicine

## 2013-09-22 DIAGNOSIS — R195 Other fecal abnormalities: Secondary | ICD-10-CM

## 2013-09-22 DIAGNOSIS — D649 Anemia, unspecified: Secondary | ICD-10-CM

## 2013-09-22 LAB — CBC
HEMATOCRIT: 30.5 % — AB (ref 36.0–46.0)
HEMOGLOBIN: 9.2 g/dL — AB (ref 12.0–15.0)
MCH: 25.9 pg — ABNORMAL LOW (ref 26.0–34.0)
MCHC: 30.2 g/dL (ref 30.0–36.0)
MCV: 85.9 fL (ref 78.0–100.0)
Platelets: 98 10*3/uL — ABNORMAL LOW (ref 150–400)
RBC: 3.55 MIL/uL — ABNORMAL LOW (ref 3.87–5.11)
RDW: 17.1 % — AB (ref 11.5–15.5)
WBC: 18.6 10*3/uL — AB (ref 4.0–10.5)

## 2013-09-22 LAB — BASIC METABOLIC PANEL
BUN: 22 mg/dL (ref 6–23)
CO2: 30 mEq/L (ref 19–32)
Calcium: 8.2 mg/dL — ABNORMAL LOW (ref 8.4–10.5)
Chloride: 98 mEq/L (ref 96–112)
Creatinine, Ser: 0.91 mg/dL (ref 0.50–1.10)
GFR, EST AFRICAN AMERICAN: 65 mL/min — AB (ref >90)
GFR, EST NON AFRICAN AMERICAN: 56 mL/min — AB (ref >90)
Glucose, Bld: 91 mg/dL (ref 70–99)
Potassium: 3.7 mEq/L (ref 3.7–5.3)
SODIUM: 140 meq/L (ref 137–147)

## 2013-09-22 LAB — GLUCOSE, CAPILLARY: Glucose-Capillary: 94 mg/dL (ref 70–99)

## 2013-09-22 LAB — H. PYLORI ANTIBODY, IGG: H Pylori IgG: <0.4 {ISR}

## 2013-09-22 MED ORDER — CIPROFLOXACIN IN D5W 400 MG/200ML IV SOLN
400.0000 mg | Freq: Two times a day (BID) | INTRAVENOUS | Status: DC
  Administered 2013-09-22: 400 mg via INTRAVENOUS
  Filled 2013-09-22: qty 200

## 2013-09-22 MED ORDER — METRONIDAZOLE IN NACL 5-0.79 MG/ML-% IV SOLN
500.0000 mg | Freq: Three times a day (TID) | INTRAVENOUS | Status: AC
  Administered 2013-09-22 – 2013-09-26 (×14): 500 mg via INTRAVENOUS
  Filled 2013-09-22 (×16): qty 100

## 2013-09-22 NOTE — Progress Notes (Signed)
[name], HANCHER NO.:  [id number]  MEDICAL RECORD NO.:  [id number]  LOCATION:  A320                          FACILITY:  [facility]  PHYSICIAN:  Paula Compton. Willey Blade, MD       DATE OF BIRTH:  03/25/1927  DATE OF PROCEDURE:  09/21/2013 DATE OF DISCHARGE:                                PROGRESS NOTE   Mrs. Severe is taking her colonoscopy prep and voices her dissatisfaction about it.  She remained short of breath.  She has not had any signs of bleeding.  She tolerated her second transfusion of 1 unit of packed red cells yesterday.  She diuresed an unknown volume yesterday, does not appear recorded in the chart.  She appears dyspneic.  OBJECTIVE:  VITAL SIGNS:  Temperature 98, pulse 88, respirations 20, blood pressure 130/69, oxygen saturation 98%. LUNGS:  Bibasilar rales. HEART:  Irregularly irregular with a grade 2 systolic murmur. ABDOMEN:  Soft and nontender. EXTREMITIES:  She has persistent lower leg edema.  IMPRESSION/PLAN: 1. Congestive heart failure with a normal ejection fraction.  Continue     IV Lasix.  BUN and creatinine are 22 and 0.82 with a potassium of     3.7. 2. Chronic atrial fibrillation.  Eliquis on hold.  Continue diltiazem     and digoxin. 3. Chronic obstructive pulmonary disease.  Continue oxygen and     nebulizer treatments. 4. Pulmonary hypertension. 5. Anemia.  Hemoglobin is 9.0 after her second unit. 6. Thrombocytopenia.  Platelet count is up to 97,000. 7. Urinary tract infection.  Urine culture reveals E. coli at greater     than 70,000 colonies per mL.  It is sensitive to ceftriaxone, but     is resistant to quinolones.  We will continue ceftriaxone.  Plan is to proceed with colonoscopy and EGD today.     Paula Compton. Willey Blade, MD    ROF/MEDQ  D:  09/21/2013  T:  09/22/2013  Job:  [id number]

## 2013-09-22 NOTE — Progress Notes (Signed)
Consulting cardiologist: Rozann Lesches MD  Primary Cardiologist: Carlyle Dolly MD  Subjective:    Sitting up in bed, daughter in room. Alert and oriented. Apparently had decreased mental status over night. States breathing somewhat better. No chest pain.  Objective:   Temp:  [97.8 F (36.6 C)-98.8 F (37.1 C)] 98.8 F (37.1 C) (03/06 0917) Pulse Rate:  [56-121] 116 (03/06 0538) Resp:  [20-39] 24 (03/06 0538) BP: (109-148)/(44-115) 109/52 mmHg (03/06 0944) SpO2:  [90 %-100 %] 97 % (03/06 0704) Last BM Date: 09/21/13  Filed Weights   09/19/13 0500 09/20/13 0505 09/21/13 0300  Weight: 154 lb 5.2 oz (70 kg) 158 lb 1.1 oz (71.7 kg) 154 lb 8.7 oz (70.1 kg)    Intake/Output Summary (Last 24 hours) at 09/22/13 1107 Last data filed at 09/22/13 0957  Gross per 24 hour  Intake      0 ml  Output   1600 ml  Net  -1600 ml    Exam:  General: No acute distress.   Lungs: Non-labored, diminished in the bases.   Cardiac: No elevated JVP or bruits. IRRR, no gallop or rub.   Abdomen: Normoactive bowel sounds, nontender, nondistended.   Extremities: Erythematous thickened skin, with bilateral edema. Lower legs dressed.   Lab Results:  Basic Metabolic Panel:  Recent Labs Lab 09/20/13 0541 09/21/13 0504 09/22/13 0500  NA 144 144 140  K 4.0 3.7 3.7  CL 104 100 98  CO2 30 31 30   GLUCOSE 121* 115* 91  BUN 25* 22 22  CREATININE 0.88 0.82 0.91  CALCIUM 8.0* 7.9* 8.2*    Liver Function Tests:  Recent Labs Lab 09/18/13 1557  AST 18  ALT 12  ALKPHOS 115  BILITOT 0.9  PROT 7.1  ALBUMIN 3.4*    CBC:  Recent Labs Lab 09/20/13 0541 09/21/13 0504 09/22/13 0602  WBC 12.9* 14.3* 18.6*  HGB 8.6* 9.0* 9.2*  HCT 29.3* 29.9* 30.5*  MCV 86.2 85.7 85.9  PLT 89* 97* 98*    Cardiac Enzymes:  Recent Labs Lab 09/18/13 2051 09/19/13 0257 09/19/13 0842  TROPONINI <0.30 <0.30 <0.30    Echocardiogram (3/3):  Study Conclusions  - Left ventricle: The cavity  size was normal. Wall thickness was increased in a pattern of mild LVH. Systolic function was normal. The estimated ejection fraction was in the range of 60% to 65%. Wall motion was normal; there were no regional wall motion abnormalities. The study is not technically sufficient to allow evaluation of LV diastolic function. - Aortic valve: Mildly to moderately calcified annulus. Trileaflet; mildly calcified leaflets. Cusp separation was reduced. There was mild to moderate stenosis. No significant regurgitation. Mean gradient: 63mm Hg (S). Peak gradient: 38mm Hg (S). VTI ratio of LVOT to aortic valve: 0.52. Valve area: 1.46cm^2(VTI). Valve area: 1.38cm^2 (Vmax). - Mitral valve: Calcified annulus. Mildly thickened leaflets . Moderate regurgitation directed posteriorly. - Left atrium: The atrium was severely dilated. - Right ventricle: The cavity size was moderately dilated. Systolic function was mildly to moderately reduced. - Right atrium: The atrium was mildly to moderately dilated. Central venous pressure: 31mm Hg (est). - Tricuspid valve: Mild regurgitation. - Pulmonary arteries: Systolic pressure was severely increased. PA peak pressure: 23mm Hg (S). - Pericardium, extracardiac: There was no pericardial Effusion.   Impressions:  - Mild LVH with LVEF 60-65%, indeterminate diastolic dysfunction. Severe left atrial enlargement. MAC with thickened mitral leaflets and moderate posteriorly directed mitral regurgitation. Mild to moderate aortic stenosis. Moderate RV dilatation with reduced contraction.  Mild tricuspid regurgitation with severe pulmonary hypertension, PASP 72 mmHg.    Medications:   Scheduled Medications: . albuterol  2.5 mg Nebulization TID  . cefTRIAXone (ROCEPHIN)  IV  1 g Intravenous Q24H  . ciprofloxacin  400 mg Intravenous Q12H  . digoxin  0.125 mg Oral QODAY  . diltiazem  240 mg Oral Daily  . furosemide  40 mg Intravenous 3 times per day  .  metronidazole  500 mg Intravenous Q8H  . mometasone-formoterol  2 puff Inhalation BID  . pantoprazole  40 mg Oral Daily  . potassium chloride  20 mEq Oral TID  . sodium chloride  3 mL Intravenous Q12H  . tiotropium  18 mcg Inhalation Daily      PRN Medications:  sodium chloride, acetaminophen, albuterol, sodium chloride, traZODone   Assessment:   1. Chronic atrial fibrillation, rate controlled. Anticoagulation has been held in the setting of anemia and GI bleed.  2. Oxygen-dependent COPD with severe pulmonary hypertension.  3. Acute on chronic diastolic heart failure with pleural effusions. Also complicated by mitral regurgitation and aortic stenosis. She continues on IV Lasix.  4. History of ischemic heart disease status post remote PTCA to the LAD in 1998, no active angina, cardiac markers normal.   Plan/Discussion:    Current cardiac regimen includes Cardizem CD 240 mg daily, Lanoxin 0.125 mg daily, Lasix 40 mg IV 3 times a day, and potassium supplements. Renal function is holding stable. She continues to diurese. Heart rate has been up recently. She did have a bowel prep and colonoscopy yesterday. May need to have further up titration of her Cardizem CD. Would hold off beta blocker with her severe COPD.   Satira Sark, M.D., F.A.C.C.

## 2013-09-22 NOTE — Progress Notes (Signed)
Subjective:  Patient is hungry. Wants to eat. No abdominal pain.  Objective: Vital signs in last 24 hours: Temp:  [97.8 F (36.6 C)-98.8 F (37.1 C)] 98.8 F (37.1 C) 10-18-2022 0917) Pulse Rate:  [56-121] 116 2022/10/18 0538) Resp:  [20-39] 24 2022-10-18 0538) BP: (109-148)/(44-115) 109/52 mmHg 18-Oct-2022 0944) SpO2:  [90 %-100 %] 97 % 10-18-22 0704) Last BM Date: 09/21/13 General:   Alert,  Well-developed, well-nourished, pleasant and cooperative in NAD. Daughter and husband at bedside. Head:  Normocephalic and atraumatic. Eyes:  Sclera clear, no icterus.   Abdomen:  Soft, nontender. Normal bowel sounds, without guarding, and without rebound.   Extremities:  Without clubbing, deformity. Both lower extremities wrapped. Neurologic:  Alert and  oriented x4;  grossly normal neurologically. Skin:  Intact without significant lesions or rashes. Psych:  Alert and cooperative. Normal mood and affect.  Intake/Output from previous day: 03/05 0701 - October 18, 2022 0700 In: 0  Out: 1300 [Urine:1300] Intake/Output this shift: Total I/O In: -  Out: 500 [Urine:500]  Lab Results: CBC  Recent Labs  09/20/13 0541 09/21/13 0504 October 17, 2013 0602  WBC 12.9* 14.3* 18.6*  HGB 8.6* 9.0* 9.2*  HCT 29.3* 29.9* 30.5*  MCV 86.2 85.7 85.9  PLT 89* 97* 98*   BMET  Recent Labs  09/20/13 0541 09/21/13 0504 2013-10-17 0500  NA 144 144 140  K 4.0 3.7 3.7  CL 104 100 98  CO2 _0 GLUCOSE 121* 115* 91  BUN 25* 22 22  CREATININE 0.88 0.82 0.91  CALCIUM 8.0* 7.9* 8.2*   LFTs No results found for this basename: BILITOT, BILIDIR, IBILI, ALKPHOS, AST, ALT, PROT, ALBUMIN,  in the last 72 hours No results found for this basename: LIPASE,  in the last 72 hours PT/INR No results found for this basename: LABPROT, INR,  in the last 72 hours    Imaging Studies: Ct Abdomen Wo Contrast  10/17/13   CLINICAL DATA:  Lethargy.  Distended abdomen.  EXAM: CT ABDOMEN WITHOUT CONTRAST  TECHNIQUE: Multidetector CT imaging of  the abdomen was performed following the standard protocol without IV contrast.  COMPARISON:  07/28/2010  FINDINGS: BODY WALL: Unremarkable.  LOWER CHEST: Multi chamber cardiac enlargement. Coronary atherosclerosis. Small bilateral pleural effusions with bibasilar atelectasis. There may also be superimposed consolidation. Haziness of the lungs may reflect generalized atelectasis or mild edema. There is an enlarged prevascular lymph node measuring 21 mm in diameter.  ABDOMEN/PELVIS:  Liver: Questionable lobulation of the liver. No other morphologic changes to suggest cirrhosis.  Biliary: No evidence of biliary obstruction or stone.  Pancreas: Unremarkable.  Spleen: Chronic splenomegaly (since 2006 at least) with a 17 cm craniocaudal span.  Adrenals: Unremarkable.  Kidneys and ureters: Size stable presumed cyst from the lower pole right kidney, measuring 16 mm.  Bladder: Unremarkable.  Reproductive: Unremarkable.  Bowel: The colon is gas distended to the level of the splenic flexure, where there is pericolonic fat infiltration and localized peritoneal fluid. There is no pneumoperitoneum. No pericecal inflammation.  Retroperitoneum: No mass or adenopathy.  Peritoneum: No free fluid or gas.  Vascular: No acute abnormality.  OSSEOUS: There are compression deformities of L1, L2, L3, and L4. These are progressed from 2012, but do not appear acute.  Acute findings were called by telephone at the time of interpretation on October 17, 2013 at 6:52 AM to Dr. Hurshel Party , who verbally acknowledged these results.  IMPRESSION: 1. Abnormal splenic flexure, most consistent with colonic injury related to recent colonoscopy. There is localized fluid  and inflammation, but no pneumoperitoneum to suggest free perforation. 2. Small bilateral pleural effusions with basilar atelectasis. Cannot exclude superimposed infection or aspiration. 3. Chronic splenomegaly. There is also mediastinal adenopathy. Suggest outpatient workup for lymphome.    Electronically Signed   By: Jorje Guild M.D.   On: 09/22/2013 06:57   Dg Chest Port 1 View  09/18/2013   CLINICAL DATA:  Shortness of breath.  EXAM: PORTABLE CHEST - 1 VIEW  COMPARISON:  03/10/2012 and 07/23/2011  FINDINGS: Lungs are adequately inflated and demonstrate hazy mixed interstitial airspace density over the lungs worse in the perihilar regions. There is bibasilar opacification compatible with small to moderate bilateral effusions likely with associated atelectasis. Moderate stable cardiomegaly. Remainder the exam is unchanged.  IMPRESSION: Constellation of findings likely representing congestive heart failure with small to moderate bilateral effusions. Cannot exclude infection.   Electronically Signed   By: Marin Olp M.D.   On: 09/18/2013 16:18  [2 weeks]   Assessment: 80 y/o female with new onset anemia without overt GI bleeding in setting of Eliquis for Afib. Heme positive. EGD showed hiatal hernia, gastric erosions (hpylori serologies negative this admission). Colonoscopy showed multiple colon polyps which were removed/treated. Large rectal polyp actively oozing and was tattooed and had three hemostasis clips placed.   CT obtained early this morning for distended abdomen and lethargy. Noted to have abnormal splenic flexure post colonoscopy but no free air. 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 Also with chronic splenomegaly and mediastinal adenopathy.   Plan: 1. Discussed with Dr. Gala Romney. IV Cipro and IV Flagyl today. Hopefully transition to oral tomorrow and complete five days of therapy. 2. NPO today (due to postpolypectomy syndrome), if doing well, resume diet tomorrow.  3. No anticoagulation for 10 days.  4. No MRI until proven to have passed the hemostasis clips. 5. F/U path. 6. Patient is also on Rocephin for UTI. Can be adjusted as per Dr. Willey Blade. 7. Discussed CT findings with patient's daughter regarding chronic splenomegaly and mediastinal adenopathy. Further management per  Dr. Willey Blade. Patient reportedly has seen Dr. Tressie Stalker in the past with bone marrow biopsy, for leukocytosis.   LOS: 4 days   Neil Crouch  09/22/2013, 12:30 PM

## 2013-09-22 NOTE — Progress Notes (Addendum)
REVIEWED. PT DOES NOT NEED ROCEPHIN/CIP/FLAGYL. D/C CIP.

## 2013-09-22 NOTE — Progress Notes (Signed)
0520 the patient is noticeably lethargic, will not open her eyes and just grunts when responding.  Vitals stable BP 133/58, HR 116, RR 24, O2 sats 100% on 4 liters [place].  CBG 97.  On call MD notified. Orders received and carried out.  Will continue to monitor.

## 2013-09-23 ENCOUNTER — Inpatient Hospital Stay (HOSPITAL_COMMUNITY): Payer: Medicare Other

## 2013-09-23 DIAGNOSIS — R109 Unspecified abdominal pain: Secondary | ICD-10-CM

## 2013-09-23 LAB — CBC WITH DIFFERENTIAL/PLATELET
Basophils Absolute: 0 10*3/uL (ref 0.0–0.1)
Basophils Relative: 0 % (ref 0–1)
EOS ABS: 0.2 10*3/uL (ref 0.0–0.7)
EOS PCT: 1 % (ref 0–5)
HCT: 31.1 % — ABNORMAL LOW (ref 36.0–46.0)
HEMOGLOBIN: 9 g/dL — AB (ref 12.0–15.0)
Lymphocytes Relative: 8 % — ABNORMAL LOW (ref 12–46)
Lymphs Abs: 1.3 10*3/uL (ref 0.7–4.0)
MCH: 24.7 pg — ABNORMAL LOW (ref 26.0–34.0)
MCHC: 28.9 g/dL — AB (ref 30.0–36.0)
MCV: 85.4 fL (ref 78.0–100.0)
Monocytes Absolute: 3.8 10*3/uL — ABNORMAL HIGH (ref 0.1–1.0)
Monocytes Relative: 24 % — ABNORMAL HIGH (ref 3–12)
NEUTROS PCT: 67 % (ref 43–77)
Neutro Abs: 10.5 10*3/uL — ABNORMAL HIGH (ref 1.7–7.7)
PLATELETS: 100 10*3/uL — AB (ref 150–400)
RBC: 3.64 MIL/uL — AB (ref 3.87–5.11)
RDW: 17.1 % — ABNORMAL HIGH (ref 11.5–15.5)
WBC: 15.8 10*3/uL — ABNORMAL HIGH (ref 4.0–10.5)

## 2013-09-23 LAB — COMPREHENSIVE METABOLIC PANEL
ALT: 11 U/L (ref 0–35)
AST: 14 U/L (ref 0–37)
Albumin: 3 g/dL — ABNORMAL LOW (ref 3.5–5.2)
Alkaline Phosphatase: 112 U/L (ref 39–117)
BUN: 21 mg/dL (ref 6–23)
CALCIUM: 8.2 mg/dL — AB (ref 8.4–10.5)
CO2: 32 mEq/L (ref 19–32)
CREATININE: 0.96 mg/dL (ref 0.50–1.10)
Chloride: 95 mEq/L — ABNORMAL LOW (ref 96–112)
GFR, EST AFRICAN AMERICAN: 61 mL/min — AB (ref >90)
GFR, EST NON AFRICAN AMERICAN: 53 mL/min — AB (ref >90)
GLUCOSE: 145 mg/dL — AB (ref 70–99)
Potassium: 4 mEq/L (ref 3.7–5.3)
Sodium: 137 mEq/L (ref 137–147)
Total Bilirubin: 0.6 mg/dL (ref 0.3–1.2)
Total Protein: 6.6 g/dL (ref 6.0–8.3)

## 2013-09-23 LAB — MAGNESIUM: Magnesium: 2 mg/dL (ref 1.5–2.5)

## 2013-09-23 NOTE — Progress Notes (Signed)
NAMEKYLEIGH, NANNINI NO.:  [id number]  MEDICAL RECORD NO.:  [id number]  LOCATION:  A320                          FACILITY:  [facility]  PHYSICIAN:  Paula Compton. Willey Blade, MD       DATE OF BIRTH:  20-Feb-1927  DATE OF PROCEDURE:  09/23/2013 DATE OF DISCHARGE:                                PROGRESS NOTE   SUBJECTIVE:  Ms. Strang complains of a gassy abdomen today.  She has tolerated full liquids.  She denies vomiting.  She has not had a bowel movement.  She feels distended.  She is relatively comfortable with her breathing.  She has diuresed 1750 mL.  She has had no fever.  OBJECTIVE:  VITAL SIGNS:  Temperature is 97.5, heart rate has ranged from 64-100, respirations 20, blood pressure 113/47, oxygen saturation 94%. LUNGS:  Clear. HEART:  Irregularly irregular. ABDOMEN:  Distended and mildly tender.  Bowel sounds are present. EXTREMITIES:  2 to 3+ edema in the lower legs.  IMPRESSION: 1. Post polypectomy syndrome.  Appreciate GI consultation and     assistance.  Recheck leukocytosis tomorrow.  Continue IV     antibiotics with ceftriaxone and metronidazole. 2. Congestive heart failure with a normal ejection fraction.  Continue     IV Lasix. 3. Chronic atrial fibrillation.  Continue holding Eliquis. 4. Anemia.  Recheck hemoglobin tomorrow, last was 9.2. 5. Chronic obstructive pulmonary disease and pulmonary hypertension.     She is oxygenating well on 4 L by nasal cannula. 6. Urinary tract infection (Escherichia coli).  Continue ceftriaxone.     Paula Compton. Willey Blade, MD     ROF/MEDQ  D:  09/23/2013  T:  09/23/2013  Job:  [id number]

## 2013-09-23 NOTE — Progress Notes (Addendum)
Pt complained of some SOB and dyspnea this evening. On assessment pt was found to be so SOB that she was having difficulty breathing. VS are stable at 130/54, 24 RPM,  96% on 4L, 93 bpm. RT is here administering a breathing treatment. MD notified, he is agreeable to letting pt receive breathing treatment and reassessing afterward. WIll continue to monitor.

## 2013-09-23 NOTE — Progress Notes (Addendum)
Subjective: Since I last evaluated the patient SHE HAS DEVELOPED ABD PAIN BUT NO VOMITING. TOLERATING POs. PREFERS TO HAVE PORTABLE EXAM. LAST BM DURING BOWEL PREP. PASSING A SMALL AMOUNT OF GAS.  Objective: Vital signs in last 24 hours: Temp:  [97.5 F (36.4 C)-98.3 F (36.8 C)] 97.5 F (36.4 C) (03/07 0421) Pulse Rate:  [64-100] 64 (03/07 0421) Resp:  [20] 20 (03/07 0421) BP: (113-123)/(39-50) 113/47 mmHg (03/07 0827) SpO2:  [90 %-96 %] 94 % (03/07 0730) Weight:  [163 lb 9.3 oz (74.2 kg)] 163 lb 9.3 oz (74.2 kg) (03/06 1515) Last BM Date: 09/22/13  Intake/Output from previous day: 03/06 0701 - 03/07 0700 In: 360 [P.O.:360] Out: 1750 [Urine:1750] Intake/Output this shift:    General appearance: alert, cooperative and mild distress Resp: clear to auscultation bilaterally Cardio: irregularly irregular rhythm GI: soft, MILDLY tender; MODERATE DISTENTION, HYPERACTIVE BOWEL SOUNDS, NO REBOUND OR GUARDING Extremities: edema BIL LE  Lab Results:  Recent Labs  09/21/13 0504 09/22/13 0602  WBC 14.3* 18.6*  HGB 9.0* 9.2*  HCT 29.9* 30.5*  PLT 97* 98*   BMET  Recent Labs  09/21/13 0504 09/22/13 0500  NA 144 140  K 3.7 3.7  CL 100 98  CO2 31 30  GLUCOSE 115* 91  BUN 22 22  CREATININE 0.82 0.91  CALCIUM 7.9* 8.2*   LFT No results found for this basename: PROT, ALBUMIN, AST, ALT, ALKPHOS, BILITOT, BILIDIR, IBILI,  in the last 72 hours PT/INR No results found for this basename: LABPROT, INR,  in the last 72 hours Hepatitis Panel No results found for this basename: HEPBSAG, HCVAB, HEPAIGM, HEPBIGM,  in the last 72 hours C-Diff No results found for this basename: CDIFFTOX,  in the last 72 hours Fecal Lactopherrin No results found for this basename: FECLLACTOFRN,  in the last 72 hours  Studies/Results: Ct Abdomen Wo Contrast  09/22/2013   CLINICAL DATA:  Lethargy.  Distended abdomen.  EXAM: CT ABDOMEN WITHOUT CONTRAST  TECHNIQUE: Multidetector CT imaging of the  abdomen was performed following the standard protocol without IV contrast.  COMPARISON:  07/28/2010  FINDINGS: BODY WALL: Unremarkable.  LOWER CHEST: Multi chamber cardiac enlargement. Coronary atherosclerosis. Small bilateral pleural effusions with bibasilar atelectasis. There may also be superimposed consolidation. Haziness of the lungs may reflect generalized atelectasis or mild edema. There is an enlarged prevascular lymph node measuring 21 mm in diameter.  ABDOMEN/PELVIS:  Liver: Questionable lobulation of the liver. No other morphologic changes to suggest cirrhosis.  Biliary: No evidence of biliary obstruction or stone.  Pancreas: Unremarkable.  Spleen: Chronic splenomegaly (since 2006 at least) with a 17 cm craniocaudal span.  Adrenals: Unremarkable.  Kidneys and ureters: Size stable presumed cyst from the lower pole right kidney, measuring 16 mm.  Bladder: Unremarkable.  Reproductive: Unremarkable.  Bowel: The colon is gas distended to the level of the splenic flexure, where there is pericolonic fat infiltration and localized peritoneal fluid. There is no pneumoperitoneum. No pericecal inflammation.  Retroperitoneum: No mass or adenopathy.  Peritoneum: No free fluid or gas.  Vascular: No acute abnormality.  OSSEOUS: There are compression deformities of L1, L2, L3, and L4. These are progressed from 2012, but do not appear acute.  Acute findings were called by telephone at the time of interpretation on 09/22/2013 at 6:52 AM to Dr. Hurshel Party , who verbally acknowledged these results.  IMPRESSION: 1. Abnormal splenic flexure, most consistent with colonic injury related to recent colonoscopy. There is localized fluid and inflammation, but no pneumoperitoneum to  suggest free perforation. 2. Small bilateral pleural effusions with basilar atelectasis. Cannot exclude superimposed infection or aspiration. 3. Chronic splenomegaly. There is also mediastinal adenopathy. Suggest outpatient workup for lymphome.    Electronically Signed   By: Jorje Guild M.D.   On: 09/22/2013 06:57    Medications: I have reviewed the patient's current medications.  Assessment/Plan: ADMITTED WITH ANEMIA. S/P EGD/TCS AND NOW WITH DISTENDED ABDOMEN.DIFFRENTIAL DIAGNOSIS INCLUDES ILEUS, LESS LIKELY PERFORATION. PT'S VOMITING HAS RESOLVED.  PLAN: 1. CHECK CMP/MG/CBC TODAY 2. STAT PORTABLE AP ABD/L LAT DECUBITUS 3. MAY HAVE FULL LIQUIDS FOR NOW. 4. CONTINUE TO MONITOR SYMPTOMS.   1100: ADDENDUM-KUB AP AND L LAT DECUBITUS: ILEUS. NO FREE AIR. 1233: CMP/MG WNLS EXCEPT ALB 3.0, WBC 15.8, HB 9.0. PERSONALLY REVIEWED ABX COVERAGE OF [facility] V. CIP. ORGANISM NOT COVERED BY [facility]-ACINETOBACTER, PSEUDOMONAS, AND LEGIONELLA.   LOS: 5 days   [facility] 09/23/2013, 10:22 AM

## 2013-09-24 ENCOUNTER — Inpatient Hospital Stay (HOSPITAL_COMMUNITY): Payer: Medicare Other

## 2013-09-24 LAB — CBC
HCT: 29.6 % — ABNORMAL LOW (ref 36.0–46.0)
Hemoglobin: 8.7 g/dL — ABNORMAL LOW (ref 12.0–15.0)
MCH: 25.1 pg — ABNORMAL LOW (ref 26.0–34.0)
MCHC: 29.4 g/dL — ABNORMAL LOW (ref 30.0–36.0)
MCV: 85.3 fL (ref 78.0–100.0)
PLATELETS: 89 10*3/uL — AB (ref 150–400)
RBC: 3.47 MIL/uL — AB (ref 3.87–5.11)
RDW: 17.3 % — ABNORMAL HIGH (ref 11.5–15.5)
WBC: 12.1 10*3/uL — ABNORMAL HIGH (ref 4.0–10.5)

## 2013-09-24 LAB — BASIC METABOLIC PANEL
BUN: 18 mg/dL (ref 6–23)
CALCIUM: 8 mg/dL — AB (ref 8.4–10.5)
CHLORIDE: 97 meq/L (ref 96–112)
CO2: 31 meq/L (ref 19–32)
Creatinine, Ser: 0.84 mg/dL (ref 0.50–1.10)
GFR calc Af Amer: 72 mL/min — ABNORMAL LOW (ref >90)
GFR calc non Af Amer: 62 mL/min — ABNORMAL LOW (ref >90)
Glucose, Bld: 111 mg/dL — ABNORMAL HIGH (ref 70–99)
Potassium: 3.3 mEq/L — ABNORMAL LOW (ref 3.7–5.3)
SODIUM: 138 meq/L (ref 137–147)

## 2013-09-24 MED ORDER — FUROSEMIDE 10 MG/ML IJ SOLN
40.0000 mg | Freq: Two times a day (BID) | INTRAMUSCULAR | Status: DC
  Administered 2013-09-24 – 2013-09-27 (×6): 40 mg via INTRAVENOUS
  Filled 2013-09-24 (×6): qty 4

## 2013-09-24 MED ORDER — POTASSIUM CHLORIDE CRYS ER 20 MEQ PO TBCR
20.0000 meq | EXTENDED_RELEASE_TABLET | Freq: Four times a day (QID) | ORAL | Status: AC
  Administered 2013-09-24 – 2013-09-26 (×8): 20 meq via ORAL
  Filled 2013-09-24 (×8): qty 1

## 2013-09-24 NOTE — Progress Notes (Signed)
Subjective: Since I last evaluated the patient she c/o sob and can't lay flat. HAS NOT PASSED A LARGE AMOUNT OF GAS OR STOOL. TOLERATING FULL LIQUIDS. NO NAUSEA OR VOMITING. MIDL ABDOMINAL PAIN.  Objective: Vital signs in last 24 hours: Temp:  [97.5 F (36.4 C)-98.1 F (36.7 C)] 97.5 F (36.4 C) (03/08 0455) Pulse Rate:  [82-99] 95 (03/08 0455) Resp:  [20] 20 (03/08 0455) BP: (111-130)/(43-60) 112/43 mmHg (03/08 0955) SpO2:  [93 %-100 %] 96 % (03/08 0841) Weight:  [166 lb 4.3 oz (75.42 kg)-168 lb 4.8 oz (76.34 kg)] 166 lb 4.3 oz (75.42 kg) (03/08 0455) Last BM Date: 09/23/13  Intake/Output from previous day: 03/07 0701 - 03/08 0700 In: 600 [P.O.:600] Out: 1150 [Urine:1150] Intake/Output this shift:    General appearance: alert, cooperative and mild distress Resp: diminished breath sounds anterior - bilateral. CRACKELS IN BASES BIL, FAIR AI MOVEMENT Cardio: irregularly irregular rhythm GI: soft, non-tender, DISTENDED; bowel sounds normal; EXAM LIMITED-PT IN CHAIR  Lab Results:  Recent Labs  09/22/13 0602 09/23/13 1052 09/24/13 0604  WBC 18.6* 15.8* 12.1*  HGB 9.2* 9.0* 8.7*  HCT 30.5* 31.1* 29.6*  PLT 98* 100* 89*   BMET  Recent Labs  09/22/13 0500 09/23/13 1052 09/24/13 0604  NA 140 137 138  K 3.7 4.0 3.3*  CL 98 95* 97  CO2 30 32 31  GLUCOSE 91 145* 111*  BUN 22 21 18   CREATININE 0.91 0.96 0.84  CALCIUM 8.2* 8.2* 8.0*   LFT  Recent Labs  09/23/13 1052  PROT 6.6  ALBUMIN 3.0*  AST 14  ALT 11  ALKPHOS 112  BILITOT 0.6    Studies/Results: Dg Abd Portable 2v  09/23/2013   CLINICAL DATA:  Abdominal pain, distention.  Recent colonoscopy.  EXAM: PORTABLE ABDOMEN - 2 VIEW  COMPARISON:  CT ABDOMEN W/O CM dated 09/22/2013  FINDINGS: Gaseous distension of bowel, predominantly the transverse colon and sigmoid colon. This likely reflects ileus or gas related to recent colonoscopy. No free air visualized. No organomegaly. No suspicious calcification.   IMPRESSION: Gaseous distension within the colon as above, possibly related ileus or related to recent colonoscopy. No obstruction or free air.   Electronically Signed   By: Rolm Baptise M.D.   On: 09/23/2013 10:55    Medications: I have reviewed the patient's current medications.  Assessment/Plan: ADMITTED WITH ANEMIA. S/P  COLONOSCOPY NOW WITH ILEUS POSSIBLE DUE TO NARCOTICS, BUT PT HAS BOWEL SOUNDS. DISTENTION CONTINUES. NOW C/O PND.   HEMODYNAMICALLY STABLE. PASSING A SMALL AMOUNT OF GAS. NO ACTIVE BLEEDING. HB STABLE.  PLAN: 1. ACUTE ABD SERIES TODAY 2. FULL LIQUID DIET 3. CONSIDER FLEX SIG TO DECOMPRESS COLON 4. PROTONIX BID 5. CONTINUE TO MONITOR FOR SYMPTOMS OF ACUTE ABDOMEN/PERFORATION.              LOS: 6 days   Arnette Driggs 09/24/2013, 10:17 AM

## 2013-09-24 NOTE — Progress Notes (Signed)
NAMEDANIQUA, CAMPOY NO.:  [id number]  MEDICAL RECORD NO.:  [id number]  LOCATION:  A320                          FACILITY:  [facility]  PHYSICIAN:  Paula Compton. Willey Blade, MD       DATE OF BIRTH:  06-Jun-1927  DATE OF PROCEDURE:  09/24/2013 DATE OF DISCHARGE:                                PROGRESS NOTE   SUBJECTIVE:  Ms. Shevchenko complains of feeling distended and bloated still.  She has had one small bowel movement yesterday.  She denies passing any significant amount of gas.  She has had increased shortness of breath after becoming upset from a visitor crying in the room last night according to her daughter.  OBJECTIVE:  GENERAL:  She is alert and oriented. VITAL SIGNS:  Temperature 97.5, pulse 95, respirations 20, blood pressure 112/43, oxygen saturation 96% on 4 L. LUNGS:  No wheezes. HEART:  Irregularly irregular, with a stable systolic murmur. ABDOMEN:  Distended and mildly tender.  Bowel sounds are present. EXTREMITIES:  Leg edema.  IMPRESSION/PLAN: 1. Post polypectomy syndrome.  Abdominal films yesterday revealed     gaseous distention of the colon predominantly in the transverse     colon and sigmoid colon, felt to likely reflect an ileus.  There     was no free air.  White count is down to 12.1.  She remains on     antibiotic therapy including ceftriaxone and metronidazole. 2. Escherichia coli urinary tract infection.  Continue ceftriaxone. 3. Hypokalemia.  Serum potassium is dropped from 4.0-3.3, we will     treat with supplemental potassium orally. 4. Protein-calorie malnutrition.  Albumin is down at 3.0.  Diet     remains at a full liquid diet. 5. Thrombocytopenia and anemia.  Hemoglobin is 8.7 with a platelet     count of 89,000.  She has had a chronic leukocytosis and     splenomegaly for at least 10 years and has previously been     evaluated by Hematology.  She has declined further investigation. 6. Acute on chronic heart failure with a normal ejection  fraction.  We     will modify Lasix to 40 mg IV q.12 hours. 7. Atrial fibrillation.  Heart rate is in the 90s.  Continue digoxin     and diltiazem. 8. Chronic obstructive pulmonary disease and pulmonary hypertension.     Continue Dulera, Spiriva, and albuterol. 9. Colon polyps, pathology pending.  Her weight is 166 pounds.  She has diuresed 1150 mL over the past day.  Further treatment of her abdominal process per GI.  Discussed with Dr. Oneida Alar yesterday.     Paula Compton. Willey Blade, MD     ROF/MEDQ  D:  09/24/2013  T:  09/24/2013  Job:  [id number]

## 2013-09-25 DIAGNOSIS — D649 Anemia, unspecified: Secondary | ICD-10-CM

## 2013-09-25 NOTE — Progress Notes (Signed)
PT Cancellation Note  Patient Details Name: [name] MRN: [id number] DOB: [birth year]   Cancelled Treatment:    Reason Eval/Treat Not Completed: Fatigue/lethargy limiting ability to participate Pt c/o severe fatigue, malaise and abdominal discomfort due to gas.  We will defer PT eval until tomorrow.  Demetrios Isaacs L 09/25/2013, 2:50 PM

## 2013-09-25 NOTE — Progress Notes (Addendum)
Patient ID: MACKENZEY CROWNOVER, female   DOB: 1927-07-16, 80 y.o.   MRN: [id number]    Subjective:    No events overnight  Objective:   Temp:  [97.4 F (36.3 C)-97.9 F (36.6 C)] 97.8 F (36.6 C) (03/09 0405) Pulse Rate:  [77-99] 99 (03/09 0405) Resp:  [20] 20 (03/09 0405) BP: (112-132)/(43-74) 129/69 mmHg (03/09 0405) SpO2:  [92 %-100 %] 92 % (03/09 0659) Weight:  [167 lb 5.3 oz (75.9 kg)] 167 lb 5.3 oz (75.9 kg) (03/09 0405) Last BM Date: 09/23/13  Filed Weights   09/23/13 1435 09/24/13 0455 09/25/13 0405  Weight: 168 lb 4.8 oz (76.34 kg) 166 lb 4.3 oz (75.42 kg) 167 lb 5.3 oz (75.9 kg)    Intake/Output Summary (Last 24 hours) at 09/25/13 0940 Last data filed at 09/25/13 0643  Gross per 24 hour  Intake 1899.33 ml  Output   1800 ml  Net  99.33 ml     Exam:  General:NAD  Resp: CTAB  Cardiac: irreg, 2/6 systolic murmur at apex, JVD just below angle of jaw  GI: abdomen soft, NT, ND  MSK: LE's wrapped, chronic edematous changes   Lab Results:  Basic Metabolic Panel:  Recent Labs Lab 09/22/13 0500 09/23/13 1052 09/24/13 0604  NA 140 137 138  K 3.7 4.0 3.3*  CL 98 95* 97  CO2 30 32 31  GLUCOSE 91 145* 111*  BUN 22 21 18   CREATININE 0.91 0.96 0.84  CALCIUM 8.2* 8.2* 8.0*  MG  --  2.0  --     Liver Function Tests:  Recent Labs Lab 09/18/13 1557 09/23/13 1052  AST 18 14  ALT 12 11  ALKPHOS 115 112  BILITOT 0.9 0.6  PROT 7.1 6.6  ALBUMIN 3.4* 3.0*    CBC:  Recent Labs Lab 09/22/13 0602 09/23/13 1052 09/24/13 0604  WBC 18.6* 15.8* 12.1*  HGB 9.2* 9.0* 8.7*  HCT 30.5* 31.1* 29.6*  MCV 85.9 85.4 85.3  PLT 98* 100* 89*    Cardiac Enzymes:  Recent Labs Lab 09/18/13 2051 09/19/13 0257 09/19/13 0842  TROPONINI <0.30 <0.30 <0.30    BNP:  Recent Labs  09/18/13 1557  PROBNP 1451.0*    Coagulation:  Recent Labs Lab 09/18/13 1557  INR 1.99*    ECG:   Medications:   Scheduled Medications: . albuterol  2.5 mg  Nebulization TID  . cefTRIAXone (ROCEPHIN)  IV  1 g Intravenous Q24H  . digoxin  0.125 mg Oral QODAY  . diltiazem  240 mg Oral Daily  . furosemide  40 mg Intravenous Q12H  . metronidazole  500 mg Intravenous Q8H  . mometasone-formoterol  2 puff Inhalation BID  . pantoprazole  40 mg Oral Daily  . potassium chloride  20 mEq Oral QID  . sodium chloride  3 mL Intravenous Q12H  . tiotropium  18 mcg Inhalation Daily     Infusions:     PRN Medications:  sodium chloride, acetaminophen, albuterol, sodium chloride, traZODone     Assessment/Plan    1. Acute on chronic diastolic heart failure - echo 09/19/13 LVEF 60-65%, unable to eval diastolic function, mild-mod AS, mod MR, and severe pulm HTN - she is net negative 4.5 liters since admission, I/Os even yesterday. Cr stable, she is on lasix 40mg  IV bid. Continue current diuresis  2. Afib  - no current symptoms  - off anticoag in setting of anemia, continue to follow - continue dig and diltiazem  3. Pulmonary hypertension with right side dysfunction -  likely multifactorial, related to suspected left sided disease and severe COPD.  - continue diuresis      Carlyle Dolly, M.D., F.A.C.C.

## 2013-09-25 NOTE — Progress Notes (Addendum)
Patient states she cannot breath when lying on her side with the rectal tube in place.  Patient states that she never lies on her side because she cannot breath when lying on her side.  Dr. Oneida Alar paged.

## 2013-09-25 NOTE — Progress Notes (Signed)
Nutrition Brief Note  RD pulled to chart due to LOS  Wt Readings from Last 15 Encounters:  09/25/13 167 lb 5.3 oz (75.9 kg)  09/25/13 167 lb 5.3 oz (75.9 kg)  08/10/13 148 lb (67.132 kg)  06/20/13 151 lb 3.2 oz (68.584 kg)  12/19/12 158 lb 12.8 oz (72.031 kg)  08/15/12 160 lb (72.576 kg)  06/20/12 159 lb 12.8 oz (72.485 kg)  03/12/12 162 lb 0.6 oz (73.5 kg)  12/18/11 161 lb 6.4 oz (73.211 kg)  12/16/11 161 lb (73.029 kg)  08/25/11 164 lb (74.39 kg)  08/07/11 167 lb (75.751 kg)  04/22/11 165 lb 6.4 oz (75.025 kg)  04/03/11 160 lb (72.576 kg)  03/27/11 161 lb (73.029 kg)    Body mass index is 32.68 kg/(m^2). Patient meets criteria for obesity, class I based on current BMI.   Current diet order is full liquid, patient is consuming approximately 50-100% of meals at this time. Labs and medications reviewed.   No nutrition interventions warranted at this time. If nutrition issues arise, please consult RD.   Nikai Quest A. Jimmye [facility], RD, LDN Pager: [phone]

## 2013-09-25 NOTE — Progress Notes (Signed)
[name], [name] NO.:  [id number]  MEDICAL RECORD NO.:  [id number]  LOCATION:                                 FACILITY:  PHYSICIAN:  Paula Compton. Willey Blade, MD       DATE OF BIRTH:  [birth year]  DATE OF PROCEDURE:  09/22/2013 DATE OF DISCHARGE:                                PROGRESS NOTE   SUBJECTIVE:  [name] was felt to have abdominal distention with increased lethargy early this morning.  An on-call physician was informed and the patient was evaluated.  She underwent a CT scan of the abdomen, which reveals inflammatory changes with fluid in the splenic flexure region.  She underwent a colonoscopy and polypectomy yesterday. She has not had fever.  OBJECTIVE:  VITAL SIGNS:  Her last recorded temperature was 98.3.  Her heart rate is ranging from 105 to 115 on telemetry.  Now blood pressure 133/58, oxygen saturation 97% on 4 L by nasal cannula. LUNGS:  Clear. HEART:  Irregularly irregular with a grade 2 systolic murmur. Tachycardic. ABDOMEN:  Distended and tender in the left abdomen. EXTREMITIES:  Improved edema in the legs. NEURO:  She is alert and oriented.  She states she is hungry.  IMPRESSION/PLAN: 1. Abdominal distention with possible colonic injury.     Gastroenterology is being contacted regarding further management of     this process.  There is no pneumoperitoneum noted. 2. Congestive heart failure with normal ejection fraction, improved.     Continue IV Lasix. 3. Chronic obstructive pulmonary disease.  Continue Xopenex, nebulizer     treatments. 4. Pulmonary hypertension. 5. Chronic splenomegaly.  Previously evaluated by Hematology with     history of chronic leukocytosis.  Her white count has increased. 6. Multiple colonic polyps status post excision. 7. Escherichia coli urinary tract infection.  Continue ceftriaxone     with probable antibiotic modification to adding Cipro and Flagyl to     cover her abdominal process, but will have GI see  her first. 8. Chronic atrial fibrillation.  Continue diltiazem and digoxin.     Continue holding Eliquis. 9. Anemia.  Hemoglobin is stable at 9.2. 10.Thrombocytopenia stable at 98,000.     Paula Compton. Willey Blade, MD     ROF/MEDQ  D:  09/22/2013  T:  09/22/2013  Job:  [id number]

## 2013-09-25 NOTE — Progress Notes (Signed)
55 - Dr. Oneida Alar returned page and made aware of patient being unable to tolerate the rectal tube and unable to lie on her side d/t difficulty breathing.  Dr. Oneida Alar stated it was ok to remove the tube but to document.  Orders followed.

## 2013-09-25 NOTE — Progress Notes (Signed)
Subjective: Abdomen uncomfortable. No N/V. Ate small amount of oatmeal. +flatus but "not much". States she had a BM this morning. Loose. Verified with RN.   Objective: Vital signs in last 24 hours: Temp:  [97.4 F (36.3 C)-97.9 F (36.6 C)] 97.8 F (36.6 C) (03/09 0405) Pulse Rate:  [77-99] 99 (03/09 0405) Resp:  [20] 20 (03/09 0405) BP: (112-132)/(43-74) 129/69 mmHg (03/09 0405) SpO2:  [92 %-100 %] 92 % (03/09 0659) Weight:  [167 lb 5.3 oz (75.9 kg)] 167 lb 5.3 oz (75.9 kg) (03/09 0405) Last BM 3/9: large, loose General:   Alert and oriented, pleasant Head:  Normocephalic and atraumatic. Heart:  S1, S2 present, irregularly irregular  Abdomen:  Bowel sounds present, distended but soft, complete exam limited as patient sitting up in chair.  Extremities:  2+ lower extremity edema, chronic venous stasis changes, gauze to bilateral lower extremities Neurologic:  Alert and  oriented x4;  grossly normal neurologically. Psych:  Alert and cooperative. Normal mood and affect.  Intake/Output from previous day: 03/08 0701 - 03/09 0700 In: 2019.3 [P.O.:480; I.V.:439.3; IV Piggyback:1100] Out: 2100 [Urine:2100] Intake/Output this shift:    Lab Results:  Recent Labs  09/23/13 1052 09/24/13 0604  WBC 15.8* 12.1*  HGB 9.0* 8.7*  HCT 31.1* 29.6*  PLT 100* 89*   BMET  Recent Labs  09/23/13 1052 09/24/13 0604  NA 137 138  K 4.0 3.3*  CL 95* 97  CO2 32 31  GLUCOSE 145* 111*  BUN 21 18  CREATININE 0.96 0.84  CALCIUM 8.2* 8.0*   LFT  Recent Labs  09/23/13 1052  PROT 6.6  ALBUMIN 3.0*  AST 14  ALT 11  ALKPHOS 112  BILITOT 0.6     Studies/Results: Dg Abd Acute W/chest  09/24/2013   CLINICAL DATA:  Abdominal distention.  Recent colonoscopy.  EXAM: ACUTE ABDOMEN SERIES (ABDOMEN 2 VIEW & CHEST 1 VIEW)  COMPARISON:  DG ABD PORTABLE 2V dated 09/23/2013; CT ABDOMEN W/O CM dated 09/22/2013  FINDINGS: Bibasilar chest densities are compatible with pleural fluid and  consolidation. Prominent lung markings may represent mild edema. No evidence for free air on the upright view. Images are limited due to body habitus. Again noted is gaseous distention of the transverse colon. Limited evaluation of the pelvis.  IMPRESSION: Bibasilar chest densities are compatible pleural fluid and atelectasis. Cannot exclude mild pulmonary edema.  Gaseous distention of the colon.  Limited evaluation of the pelvis.   Electronically Signed   By: Markus Daft M.D.   On: 09/24/2013 11:31   Dg Abd Portable 2v  09/23/2013   CLINICAL DATA:  Abdominal pain, distention.  Recent colonoscopy.  EXAM: PORTABLE ABDOMEN - 2 VIEW  COMPARISON:  CT ABDOMEN W/O CM dated 09/22/2013  FINDINGS: Gaseous distension of bowel, predominantly the transverse colon and sigmoid colon. This likely reflects ileus or gas related to recent colonoscopy. No free air visualized. No organomegaly. No suspicious calcification.  IMPRESSION: Gaseous distension within the colon as above, possibly related ileus or related to recent colonoscopy. No obstruction or free air.   Electronically Signed   By: Rolm Baptise M.D.   On: 09/23/2013 10:55    Assessment: 80 year old female with anemia in the setting of Eliquis for afib, EGD showing hiatal hernia, gastric erosions (hpylori serologies negative this admission). Colonoscopy showed multiple colon polyps which were removed/treated. Large rectal polyp actively oozing and was tattooed and had three hemostasis clips placed.  Now with ileus in the setting of narcotics, electrolyte imbalance.  Tolerating fulls, +flatus, and she had a large loose BM this morning. Clinically, she is slowly improving. Would hold off on flex sig currently and use supportive measures, observe for now.   Anemia: stable, no overt signs of GI bleeding. Check CBC tomorrow.   Plan: CBC tomorrow Management of electrolytes per attending: BMP pending Remain on full liquids Flagyl for total of 5 days No MRI until clips  are known to have passed No anticoagulation through 3/15 (total of 10 days) Continue PPI Follow-up on pending path from colonoscopy  Orvil Feil, ANP-BC [facility]    LOS: 7 days    09/25/2013, 7:54 AM    Addendum at 1030: discussed with Dr. Gala Romney. Place rectal tube, turn every 2 hours.  Orvil Feil, ANP-BC [facility]    Attending note:  Patient seen and examined.  Agree with assessment and plan as outlined above.  She may have had transient post polypectomy syndrome a few days ago. Picture now more consistent with colonic ileus/pseudoobstruction.   Pathology report on polyps should be out later today or tomorrow.  Discussed with son in room at length.

## 2013-09-26 ENCOUNTER — Encounter: Payer: Self-pay | Admitting: Internal Medicine

## 2013-09-26 LAB — BASIC METABOLIC PANEL
BUN: 15 mg/dL (ref 6–23)
CHLORIDE: 102 meq/L (ref 96–112)
CO2: 31 meq/L (ref 19–32)
Calcium: 8.4 mg/dL (ref 8.4–10.5)
Creatinine, Ser: 0.8 mg/dL (ref 0.50–1.10)
GFR calc Af Amer: 76 mL/min — ABNORMAL LOW (ref >90)
GFR calc non Af Amer: 66 mL/min — ABNORMAL LOW (ref >90)
Glucose, Bld: 104 mg/dL — ABNORMAL HIGH (ref 70–99)
POTASSIUM: 4.7 meq/L (ref 3.7–5.3)
SODIUM: 141 meq/L (ref 137–147)

## 2013-09-26 LAB — CBC
HEMATOCRIT: 30.5 % — AB (ref 36.0–46.0)
Hemoglobin: 8.9 g/dL — ABNORMAL LOW (ref 12.0–15.0)
MCH: 25 pg — ABNORMAL LOW (ref 26.0–34.0)
MCHC: 29.2 g/dL — AB (ref 30.0–36.0)
MCV: 85.7 fL (ref 78.0–100.0)
Platelets: 94 10*3/uL — ABNORMAL LOW (ref 150–400)
RBC: 3.56 MIL/uL — AB (ref 3.87–5.11)
RDW: 17.1 % — ABNORMAL HIGH (ref 11.5–15.5)
WBC: 10.2 10*3/uL (ref 4.0–10.5)

## 2013-09-26 NOTE — Evaluation (Signed)
Physical Therapy Evaluation Patient Details Name: [name] MRN: [id number] DOB: 1926/09/21 Today's Date: 09/26/2013 Time: 4098-1191 PT Time Calculation (min): 35 min  PT Assessment / Plan / Recommendation History of Present Illness  Pt with COPD on 2 L O2 chronically, A fib, CAD was admitted on 09-18-13 with acute on chronic CHF.  She had hypoxic respiratory failure complicated by anemia.  Although reported by the Cardiologist today that she has diuresed 5 L of fluid, pt has significant edema in both LEs with weeping of serous fluid.  She is currently on 4 L O2.  She lives with her husband (who is now also a pt in the hospital) and is normally very sedentary, using a walker only when necessary to move.  Clinical Impression   Pt was seen for evaluation.  She was alert and very cooperative, up in a chair.  Evaluation reveals profound deconditioning.  Any mobility is extremely strenuous for her.  She performed some simple isometrics and quadriceps exercises and was fatigued from this.  She tried to ambulate with a walker but was only able to take a few small steps.  Her O2 sat prior to gait was 94% on 4 L O2.  After gait, her O2 sat=78% and it took several minutes of rest for it to rebound up to 92%.  Pt has been adamant that she wants to return home at d/c, but I am strongly recommending that she go to SNF not only for the PT but for continued nursing care.    PT Assessment  Patient needs continued PT services    Follow Up Recommendations  SNF       Barriers to Discharge Decreased caregiver support      Equipment Recommendations  None recommended by PT       Frequency Min 3X/week    Precautions / Restrictions Precautions Precautions: Fall Restrictions Weight Bearing Restrictions: No     Mobility  Bed Mobility General bed mobility comments: not tested...pt up in chair. Transfers Overall transfer level: Needs assistance Equipment used: Rolling walker (2 wheeled) Transfers:  Sit to/from Stand Sit to Stand: Mod assist Ambulation/Gait Ambulation/Gait assistance: Mod assist Ambulation Distance (Feet): 2 Feet Assistive device: Rolling walker (2 wheeled) Gait Pattern/deviations: Trunk flexed;Shuffle Gait velocity interpretation: Below normal speed for age/gender General Gait Details: O2 sat decreased to 78% with taking only a few steps...she was too fatigued to walk any further    Exercises     PT Diagnosis: Difficulty walking;Generalized weakness  PT Problem List: Decreased strength;Decreased activity tolerance;Decreased mobility;Cardiopulmonary status limiting activity;Obesity PT Treatment Interventions: Gait training;Functional mobility training;Therapeutic exercise     PT Goals(Current goals can be found in the care plan section) Acute Rehab PT Goals Patient Stated Goal: wants to go home PT Goal Formulation: With patient/family Time For Goal Achievement: 10/10/13 Potential to Achieve Goals: Fair  Visit Information  Last PT Received On: 09/26/13 History of Present Illness: Pt with COPD on 2 L O2 chronically, A fib, CAD was admitted on 09-18-13 with acute on chronic CHF.  She had hypoxic respiratory failure complicated by anemia.  Although reported by the Cardiologist today that she has diuresed 5 L of fluid, pt has significant edema in both LEs with weeping of serous fluid.  She is currently on 4 L O2.  She lives with her husband (who is now also a pt in the hospital) and is normally very sedentary, using a walker only when necessary to move.       Prior  Functioning  Home Living Family/patient expects to be discharged to:: Skilled nursing facility Prior Function Level of Independence: Independent with assistive device(s) Comments: pt was able to do simple grooming tasks but unable to assist with any housework    Cognition  Cognition Arousal/Alertness: Awake/alert Behavior During Therapy: WFL for tasks assessed/performed Overall Cognitive Status:  Within Functional Limits for tasks assessed    Extremity/Trunk Assessment Lower Extremity Assessment Lower Extremity Assessment: Generalized weakness Cervical / Trunk Assessment Cervical / Trunk Assessment: Kyphotic   Balance Balance Overall balance assessment: Needs assistance Sitting-balance support: No upper extremity supported;Feet supported Sitting balance-Leahy Scale: Good Standing balance support: Bilateral upper extremity supported Standing balance-Leahy Scale: Fair  End of Session PT - End of Session Equipment Utilized During Treatment: Gait belt Activity Tolerance: Other (comment);Patient limited by fatigue (limited by respiratory distress) Patient left: in chair;with call bell/phone within reach;with chair alarm set;with family/visitor present  GP     Sable Feil 09/26/2013, 3:18 PM

## 2013-09-26 NOTE — Clinical Social Work Placement (Signed)
Clinical Social Work Department CLINICAL SOCIAL WORK PLACEMENT NOTE 09/26/2013  Patient:  [name], [name]  Account Number:  [id number] Admit date:  09/18/2013  Clinical Social Worker:  Benay Pike, LCSW  Date/time:  09/26/2013 03:56 PM  Clinical Social Work is seeking post-discharge placement for this patient at the following level of care:   [place]   (*CSW will update this form in Epic as items are completed)   09/26/2013  Patient/family provided with [place] Department of Clinical Social Work's list of facilities offering this level of care within the geographic area requested by the patient (or if unable, by the patient's family).  09/26/2013  Patient/family informed of their freedom to choose among providers that offer the needed level of care, that participate in Medicare, Medicaid or managed care program needed by the patient, have an available bed and are willing to accept the patient.  09/26/2013  Patient/family informed of [facility]' ownership interest in [facility], as well as of the fact that they are under no obligation to receive care at this facility.  PASARR submitted to EDS on 09/26/2013 PASARR number received from EDS on 09/26/2013  FL2 transmitted to all facilities in geographic area requested by pt/family on  09/26/2013 FL2 transmitted to all facilities within larger geographic area on   Patient informed that his/her managed care company has contracts with or will negotiate with  certain facilities, including the following:     Patient/family informed of bed offers received:   Patient chooses bed at  Physician recommends and patient chooses bed at    Patient to be transferred to  on   Patient to be transferred to facility by   The following physician request were entered in Epic:   Additional Comments:  Benay Pike, [place]

## 2013-09-26 NOTE — Progress Notes (Signed)
Patient ID: ANETRA CZERWINSKI, female   DOB: Jan 11, 1927, 80 y.o.   MRN: [id number]    Subjective:    SOB improving  Objective:   Temp:  [98.1 F (36.7 C)-98.5 F (36.9 C)] 98.5 F (36.9 C) (03/10 0540) Pulse Rate:  [80-99] 80 (03/10 0540) Resp:  [20] 20 (03/10 0540) BP: (122-132)/(47-62) 132/62 mmHg (03/10 0540) SpO2:  [94 %-100 %] 96 % (03/10 0747) Weight:  [167 lb 1.7 oz (75.8 kg)] 167 lb 1.7 oz (75.8 kg) (03/10 0540) Last BM Date: 09/26/13  Filed Weights   09/24/13 0455 09/25/13 0405 09/26/13 0540  Weight: 166 lb 4.3 oz (75.42 kg) 167 lb 5.3 oz (75.9 kg) 167 lb 1.7 oz (75.8 kg)    Intake/Output Summary (Last 24 hours) at 09/26/13 0835 Last data filed at 09/25/13 2349  Gross per 24 hour  Intake    720 ml  Output   1175 ml  Net   -455 ml    Telemetry: Afib normal rates  Exam:  General: NAD  Resp: CTAB  Cardiac: irreg, no m/r/g, no JVD  VV:OHYWVPX soft, NT, ND  MSK: bilateral LE's wrapped, 3+ bilateral edema  Neuro: no focal deficits   Lab Results:  Basic Metabolic Panel:  Recent Labs Lab 09/22/13 0500 09/23/13 1052 09/24/13 0604 09/26/13 0535  NA 140 137 138 141  K 3.7 4.0 3.3* 4.7  CL 98 95* 97 102  CO2 30 32 31 31  GLUCOSE 91 145* 111* 104*  BUN 22 21 18 15   CREATININE 0.91 0.96 0.84 0.80  CALCIUM 8.2* 8.2* 8.0* 8.4  MG  --  2.0  --   --     Liver Function Tests:  Recent Labs Lab 09/23/13 1052  AST 14  ALT 11  ALKPHOS 112  BILITOT 0.6  PROT 6.6  ALBUMIN 3.0*    CBC:  Recent Labs Lab 09/23/13 1052 09/24/13 0604 09/26/13 0535  WBC 15.8* 12.1* 10.2  HGB 9.0* 8.7* 8.9*  HCT 31.1* 29.6* 30.5*  MCV 85.4 85.3 85.7  PLT 100* 89* 94*    Cardiac Enzymes:  Recent Labs Lab 09/19/13 0842  TROPONINI <0.30    BNP:  Recent Labs  09/18/13 1557  PROBNP 1451.0*    Coagulation: No results found for this basename: INR,  in the last 168 hours  ECG:   Medications:   Scheduled Medications: . albuterol  2.5 mg  Nebulization TID  . cefTRIAXone (ROCEPHIN)  IV  1 g Intravenous Q24H  . digoxin  0.125 mg Oral QODAY  . diltiazem  240 mg Oral Daily  . furosemide  40 mg Intravenous Q12H  . metronidazole  500 mg Intravenous Q8H  . mometasone-formoterol  2 puff Inhalation BID  . pantoprazole  40 mg Oral Daily  . potassium chloride  20 mEq Oral QID  . sodium chloride  3 mL Intravenous Q12H  . tiotropium  18 mcg Inhalation Daily     Infusions:     PRN Medications:  sodium chloride, acetaminophen, albuterol, sodium chloride, traZODone     Assessment/Plan    1. Acute on chronic diastolic heart failure  - echo 09/19/13 LVEF 60-65%, unable to eval diastolic function, mild-mod AS, mod MR, and severe pulm HTN  - she is net negative 5 liters since admission, negative 500 mL yesterday. Cr stable, she is on lasix 40mg  IV bid. Still with significant LE edema that is chronic, rest of exam appears volume status has improved - will continue current lasix dosing today  2.  Afib  - no current symptoms  - off anticoag in setting of anemia, continue to follow  - continue dig and diltiazem   3. Pulmonary hypertension with right side dysfunction  - likely multifactorial, related to suspected left sided disease and severe COPD.  - continue diuresis  4. Anemia - EGD with gastric erosions, colonoscopy with multiple polypis with actively oozing large rectal polyp.  - continue to hold anticoag, per GI ok to resume 3/15.  5. Ileus - management per GI       Carlyle Dolly, M.D., F.A.C.C.

## 2013-09-26 NOTE — Progress Notes (Signed)
NAMEAJEE, HEASLEY NO.:  [id number]  MEDICAL RECORD NO.:  [id number]  LOCATION:                                 FACILITY:  PHYSICIAN:  Paula Compton. Willey Blade, MD       DATE OF BIRTH:  1927/05/31  DATE OF PROCEDURE:  09/25/2013 DATE OF DISCHARGE:                                PROGRESS NOTE   SUBJECTIVE:  Mrs. Padron continues to note a distended abdomen.  She has had 5 stools recorded.  She denies having a significantly large stool.  She has had a minimal amount of passage of gas.  She has diuresed 2100 mL.  She has not had a fever.  OBJECTIVE:  VITAL SIGNS:  Temperature 97.8, pulse 99, respirations 20, blood pressure 129/69, oxygen saturation 92%. LUNGS:  Minimal basilar rales. HEART:  Irregularly irregular. ABDOMEN:  Distended and mildly tender. EXTREMITIES:  Persistent edema.  IMPRESSION/PLAN: 1. Ileus status post colonoscopy and polypectomies were discussed     further with GI. 2. Congestive heart failure.  Continue Lasix. 3. Chronic atrial fibrillation.  Continue holding Eliquis. 4. Hypokalemia.  Continue potassium supplementation. 5. Anemia, stable. 6. Urinary tract infection.  Continue antibiotics.  She had a repeat abdominal film yesterday revealing continued gaseous distention of the colon.  Chest x-ray revealed bibasilar densities compatible with pleural fluid and atelectasis.     Paula Compton. Willey Blade, MD     ROF/MEDQ  D:  09/25/2013  T:  09/26/2013  Job:  [id number]

## 2013-09-26 NOTE — Clinical Social Work Psychosocial (Signed)
Clinical Social Work Department BRIEF PSYCHOSOCIAL ASSESSMENT 09/26/2013  Patient:  [name], [name]     Account Number:  [id number]     Admit date:  09/18/2013  Clinical Social Worker:  Wyatt Haste  Date/Time:  09/26/2013 03:58 PM  Referred by:  CSW  Date Referred:  09/26/2013 Referred for  SNF Placement   Other Referral:   Interview type:  Patient Other interview type:   daughter- Kendrick Fries    PSYCHOSOCIAL DATA Living Status:  HUSBAND Admitted from facility:   Level of care:   Primary support name:  George/Yvonne Primary support relationship to patient:  FAMILY Degree of support available:   supportive    CURRENT CONCERNS Current Concerns  Post-Acute Placement   Other Concerns:    SOCIAL WORK ASSESSMENT / PLAN CSW met with pt and pt's daughter, Kendrick Fries at bedside. Pt alert and oriented and reports she lives with her husband. Pt's chlidren live locally and are very involved and supportive. Pt came to ED last week with SOB. Admitted with CHF. Pt reports at baseline she is sedentary and generally spends her time in her lift chair. She is able to ambulate short distances in the home with a rolling walker, but outside the home requires wheelchair. Her husband is generally more active and takes care of things around the home and drives. Pt's daughter reports they typically go out to eat or family will bring in food. However, he was hospitalized yesterday as well. They indicate that he possibly may need dialysis. CSW discussed pt's feelings regarding situation. She reports feeling overwhelmed, stressed, and worried about her husband. CSW empathized with pt and provided support. PT evaluated pt today and recommendation is for SNF. Pt was barely able to take a few steps. She reports understanding of need for rehab and was initally wanting to go home regardless, but has now become more receptive. SNF list provided. Pt requests [facility] placement if possible. Will initiate bed search and  follow up with offers when available.   Assessment/plan status:  Psychosocial Support/Ongoing Assessment of Needs Other assessment/ plan:   Information/referral to community resources:   SNF list    PATIENT'S/FAMILY'S RESPONSE TO PLAN OF CARE: Pt initially reluctant to consider SNF, but wants to do what is best for her and her family so is agreeable. Pt's daughter encouraged pt, but wants her to make decision on her own. CSW will follow up.       Benay Pike, [place]

## 2013-09-26 NOTE — Progress Notes (Signed)
Subjective: Notes decreased abdominal distension. 1 loose stool this morning. Significantly more flatus. No N/V. Tolerating fulls. Interested in advancing diet.   Objective: Vital signs in last 24 hours: Temp:  [98.1 F (36.7 C)-98.5 F (36.9 C)] 98.5 F (36.9 C) (03/10 0540) Pulse Rate:  [80-99] 80 (03/10 0540) Resp:  [20] 20 (03/10 0540) BP: (122-132)/(47-62) 132/62 mmHg (03/10 0540) SpO2:  [94 %-100 %] 96 % (03/10 0747) Weight:  [167 lb 1.7 oz (75.8 kg)] 167 lb 1.7 oz (75.8 kg) (03/10 0540) Last BM Date: 09/25/13 General:   Alert and oriented, pleasant Heart:  S1, S2 present, irregular, systolic murmur  Lungs: Clear to auscultation bilaterally, diminished bases Abdomen:  Bowel sounds present, significantly less distended from 3/9 but still with mild distension, left flank hernia likely Extremities:  Bilateral lower extremity edema with gauze to lower extremities  Neurologic:  Alert and  oriented x4;  grossly normal neurologically. Psych:  Alert and cooperative. Normal mood and affect.  Intake/Output from previous day: 03/09 0701 - 03/10 0700 In: 720 [P.O.:720] Out: 1175 [Urine:1175] Intake/Output this shift:    Lab Results:  Recent Labs  09/23/13 1052 09/24/13 0604 09/26/13 0535  WBC 15.8* 12.1* 10.2  HGB 9.0* 8.7* 8.9*  HCT 31.1* 29.6* 30.5*  PLT 100* 89* 94*   BMET  Recent Labs  09/23/13 1052 09/24/13 0604 09/26/13 0535  NA 137 138 141  K 4.0 3.3* 4.7  CL 95* 97 102  CO2 32 31 31  GLUCOSE 145* 111* 104*  BUN 21 18 15   CREATININE 0.96 0.84 0.80  CALCIUM 8.2* 8.0* 8.4   LFT  Recent Labs  09/23/13 1052  PROT 6.6  ALBUMIN 3.0*  AST 14  ALT 11  ALKPHOS 112  BILITOT 0.6     Studies/Results: Dg Abd Acute W/chest  09/24/2013   CLINICAL DATA:  Abdominal distention.  Recent colonoscopy.  EXAM: ACUTE ABDOMEN SERIES (ABDOMEN 2 VIEW & CHEST 1 VIEW)  COMPARISON:  DG ABD PORTABLE 2V dated 09/23/2013; CT ABDOMEN W/O CM dated 09/22/2013  FINDINGS:  Bibasilar chest densities are compatible with pleural fluid and consolidation. Prominent lung markings may represent mild edema. No evidence for free air on the upright view. Images are limited due to body habitus. Again noted is gaseous distention of the transverse colon. Limited evaluation of the pelvis.  IMPRESSION: Bibasilar chest densities are compatible pleural fluid and atelectasis. Cannot exclude mild pulmonary edema.  Gaseous distention of the colon.  Limited evaluation of the pelvis.   Electronically Signed   By: Markus Daft M.D.   On: 09/24/2013 11:31    Assessment: 80 year old female with anemia and heme positive stool in the setting of Eliquis for afib, EGD showing hiatal hernia, gastric erosions (hpylori serologies negative this admission). Colonoscopy showed multiple colon polyps: tubular adenomas. Large rectal polyp actively oozing and was tattooed and had three hemostasis clips placed.  Subsequent ileus in the setting of narcotics, electrolyte imbalance. Unable to exclude a post-polypectomy syndrome presentation but now with more an Ogilvie's type presentation. Unable to tolerate rectal tube yesterday. Clinically, she has shown significant improvement since seen yesterday. Abdomen obviously less distended; tolerating diet, +flatus, and she had 1 loose stool this morning.   Anemia: stable.   Plan: Advance diet to soft for lunch Flagyl for total of 5 days No MRI until clips are known to have passed No anticoagulation through 3/15 Continue PPI If persistent loose stool, check stool studies  Orvil Feil, ANP-BC [facility]  LOS: 8 days    09/26/2013, 7:53 AM

## 2013-09-26 NOTE — Progress Notes (Signed)
REVIEWED.  

## 2013-09-27 ENCOUNTER — Encounter (HOSPITAL_COMMUNITY): Payer: Self-pay | Admitting: Emergency Medicine

## 2013-09-27 ENCOUNTER — Inpatient Hospital Stay (HOSPITAL_COMMUNITY)
Admission: EM | Admit: 2013-09-27 | Discharge: 2013-10-18 | DRG: 291 | Disposition: E | Payer: Medicare Other | Attending: Internal Medicine | Admitting: Internal Medicine

## 2013-09-27 ENCOUNTER — Emergency Department (HOSPITAL_COMMUNITY): Payer: Medicare Other

## 2013-09-27 DIAGNOSIS — N179 Acute kidney failure, unspecified: Secondary | ICD-10-CM | POA: Diagnosis not present

## 2013-09-27 DIAGNOSIS — Z825 Family history of asthma and other chronic lower respiratory diseases: Secondary | ICD-10-CM

## 2013-09-27 DIAGNOSIS — Z66 Do not resuscitate: Secondary | ICD-10-CM | POA: Diagnosis present

## 2013-09-27 DIAGNOSIS — Z8249 Family history of ischemic heart disease and other diseases of the circulatory system: Secondary | ICD-10-CM

## 2013-09-27 DIAGNOSIS — I509 Heart failure, unspecified: Secondary | ICD-10-CM | POA: Diagnosis present

## 2013-09-27 DIAGNOSIS — D72829 Elevated white blood cell count, unspecified: Secondary | ICD-10-CM | POA: Diagnosis present

## 2013-09-27 DIAGNOSIS — J4489 Other specified chronic obstructive pulmonary disease: Secondary | ICD-10-CM | POA: Diagnosis present

## 2013-09-27 DIAGNOSIS — J159 Unspecified bacterial pneumonia: Secondary | ICD-10-CM | POA: Diagnosis present

## 2013-09-27 DIAGNOSIS — E46 Unspecified protein-calorie malnutrition: Secondary | ICD-10-CM | POA: Diagnosis present

## 2013-09-27 DIAGNOSIS — L02419 Cutaneous abscess of limb, unspecified: Secondary | ICD-10-CM | POA: Diagnosis present

## 2013-09-27 DIAGNOSIS — M109 Gout, unspecified: Secondary | ICD-10-CM | POA: Diagnosis present

## 2013-09-27 DIAGNOSIS — M545 Low back pain, unspecified: Secondary | ICD-10-CM | POA: Diagnosis present

## 2013-09-27 DIAGNOSIS — L0390 Cellulitis, unspecified: Secondary | ICD-10-CM | POA: Diagnosis present

## 2013-09-27 DIAGNOSIS — K5669 Other intestinal obstruction: Secondary | ICD-10-CM

## 2013-09-27 DIAGNOSIS — R5381 Other malaise: Secondary | ICD-10-CM | POA: Diagnosis present

## 2013-09-27 DIAGNOSIS — Z833 Family history of diabetes mellitus: Secondary | ICD-10-CM

## 2013-09-27 DIAGNOSIS — I482 Chronic atrial fibrillation, unspecified: Secondary | ICD-10-CM

## 2013-09-27 DIAGNOSIS — D649 Anemia, unspecified: Secondary | ICD-10-CM | POA: Diagnosis present

## 2013-09-27 DIAGNOSIS — E869 Volume depletion, unspecified: Secondary | ICD-10-CM | POA: Diagnosis present

## 2013-09-27 DIAGNOSIS — Z87442 Personal history of urinary calculi: Secondary | ICD-10-CM

## 2013-09-27 DIAGNOSIS — E8809 Other disorders of plasma-protein metabolism, not elsewhere classified: Secondary | ICD-10-CM | POA: Diagnosis present

## 2013-09-27 DIAGNOSIS — I5033 Acute on chronic diastolic (congestive) heart failure: Principal | ICD-10-CM | POA: Diagnosis present

## 2013-09-27 DIAGNOSIS — Z6831 Body mass index (BMI) 31.0-31.9, adult: Secondary | ICD-10-CM

## 2013-09-27 DIAGNOSIS — I4891 Unspecified atrial fibrillation: Secondary | ICD-10-CM

## 2013-09-27 DIAGNOSIS — I10 Essential (primary) hypertension: Secondary | ICD-10-CM | POA: Diagnosis present

## 2013-09-27 DIAGNOSIS — I2789 Other specified pulmonary heart diseases: Secondary | ICD-10-CM | POA: Diagnosis present

## 2013-09-27 DIAGNOSIS — I2510 Atherosclerotic heart disease of native coronary artery without angina pectoris: Secondary | ICD-10-CM | POA: Diagnosis present

## 2013-09-27 DIAGNOSIS — Z800 Family history of malignant neoplasm of digestive organs: Secondary | ICD-10-CM

## 2013-09-27 DIAGNOSIS — Z9861 Coronary angioplasty status: Secondary | ICD-10-CM

## 2013-09-27 DIAGNOSIS — L03119 Cellulitis of unspecified part of limb: Secondary | ICD-10-CM

## 2013-09-27 DIAGNOSIS — J449 Chronic obstructive pulmonary disease, unspecified: Secondary | ICD-10-CM | POA: Diagnosis present

## 2013-09-27 DIAGNOSIS — K560 Paralytic ileus: Secondary | ICD-10-CM | POA: Diagnosis not present

## 2013-09-27 DIAGNOSIS — G8929 Other chronic pain: Secondary | ICD-10-CM | POA: Diagnosis present

## 2013-09-27 DIAGNOSIS — M1990 Unspecified osteoarthritis, unspecified site: Secondary | ICD-10-CM | POA: Diagnosis present

## 2013-09-27 DIAGNOSIS — E785 Hyperlipidemia, unspecified: Secondary | ICD-10-CM | POA: Diagnosis present

## 2013-09-27 DIAGNOSIS — Z7901 Long term (current) use of anticoagulants: Secondary | ICD-10-CM

## 2013-09-27 DIAGNOSIS — D692 Other nonthrombocytopenic purpura: Secondary | ICD-10-CM | POA: Diagnosis present

## 2013-09-27 DIAGNOSIS — K7460 Unspecified cirrhosis of liver: Secondary | ICD-10-CM | POA: Diagnosis present

## 2013-09-27 DIAGNOSIS — R195 Other fecal abnormalities: Secondary | ICD-10-CM

## 2013-09-27 DIAGNOSIS — R188 Other ascites: Secondary | ICD-10-CM | POA: Diagnosis present

## 2013-09-27 DIAGNOSIS — N390 Urinary tract infection, site not specified: Secondary | ICD-10-CM | POA: Diagnosis present

## 2013-09-27 LAB — CBC WITH DIFFERENTIAL/PLATELET
BASOS ABS: 0 10*3/uL (ref 0.0–0.1)
Basophils Relative: 0 % (ref 0–1)
Eosinophils Absolute: 0.1 10*3/uL (ref 0.0–0.7)
Eosinophils Relative: 1 % (ref 0–5)
HEMATOCRIT: 33.6 % — AB (ref 36.0–46.0)
Hemoglobin: 10 g/dL — ABNORMAL LOW (ref 12.0–15.0)
LYMPHS ABS: 0.7 10*3/uL (ref 0.7–4.0)
Lymphocytes Relative: 5 % — ABNORMAL LOW (ref 12–46)
MCH: 25.3 pg — AB (ref 26.0–34.0)
MCHC: 29.8 g/dL — AB (ref 30.0–36.0)
MCV: 85.1 fL (ref 78.0–100.0)
MONO ABS: 3 10*3/uL — AB (ref 0.1–1.0)
MONOS PCT: 20 % — AB (ref 3–12)
NEUTROS ABS: 11 10*3/uL — AB (ref 1.7–7.7)
Neutrophils Relative %: 74 % (ref 43–77)
PLATELETS: 100 10*3/uL — AB (ref 150–400)
RBC: 3.95 MIL/uL (ref 3.87–5.11)
RDW: 17.1 % — AB (ref 11.5–15.5)
WBC: 14.8 10*3/uL — AB (ref 4.0–10.5)

## 2013-09-27 LAB — BASIC METABOLIC PANEL
BUN: 15 mg/dL (ref 6–23)
CO2: 25 mEq/L (ref 19–32)
CREATININE: 0.87 mg/dL (ref 0.50–1.10)
Calcium: 7.9 mg/dL — ABNORMAL LOW (ref 8.4–10.5)
Chloride: 100 mEq/L (ref 96–112)
GFR calc non Af Amer: 59 mL/min — ABNORMAL LOW (ref >90)
GFR, EST AFRICAN AMERICAN: 69 mL/min — AB (ref >90)
Glucose, Bld: 134 mg/dL — ABNORMAL HIGH (ref 70–99)
Potassium: 3.9 mEq/L (ref 3.7–5.3)
Sodium: 137 mEq/L (ref 137–147)

## 2013-09-27 LAB — TROPONIN I

## 2013-09-27 LAB — DIGOXIN LEVEL: DIGOXIN LVL: 0.6 ng/mL — AB (ref 0.8–2.0)

## 2013-09-27 LAB — PRO B NATRIURETIC PEPTIDE: Pro B Natriuretic peptide (BNP): 2121 pg/mL — ABNORMAL HIGH (ref 0–450)

## 2013-09-27 MED ORDER — ONDANSETRON 4 MG PO TBDP
4.0000 mg | ORAL_TABLET | Freq: Once | ORAL | Status: AC
  Administered 2013-09-27: 4 mg via ORAL

## 2013-09-27 MED ORDER — FUROSEMIDE 10 MG/ML IJ SOLN: 60.0000 mg | Freq: Two times a day (BID) | INTRAMUSCULAR | Status: DC

## 2013-09-27 MED ORDER — ONDANSETRON 4 MG PO TBDP
ORAL_TABLET | ORAL | Status: AC
  Filled 2013-09-27: qty 1

## 2013-09-27 MED ORDER — PANTOPRAZOLE SODIUM 40 MG PO TBEC: 40.0000 mg | DELAYED_RELEASE_TABLET | Freq: Every day | ORAL | Status: AC

## 2013-09-27 MED ORDER — FUROSEMIDE 20 MG PO TABS: 80.0000 mg | ORAL_TABLET | Freq: Two times a day (BID) | ORAL | Status: AC

## 2013-09-27 MED ORDER — FUROSEMIDE 10 MG/ML IJ SOLN: 80.0000 mg | Freq: Once | INTRAMUSCULAR | Status: DC

## 2013-09-27 NOTE — Progress Notes (Signed)
[name], [name]:  [id number]  MEDICAL RECORD NO.:  [id number]  LOCATION:                                 FACILITY:  PHYSICIAN:  Paula Compton. Willey Blade, MD       DATE OF BIRTH:  01-13-1927  DATE OF PROCEDURE:  09/26/2013 DATE OF DISCHARGE:                                PROGRESS NOTE   SUBJECTIVE:  [name] feels better.  She has had a good bowel movement this morning.  She is still not passing a lot of gas.  She was unable to tolerate her rectal tube yesterday.  Her breathing is more comfortable.  She diuresed 1175 mL yesterday.  She looks more comfortable.  OBJECTIVE:  VITAL SIGNS:  Temperature 98.5, pulse 80, respirations 20, blood pressure 132/62, oxygen saturation 96%. LUNGS:  Bilateral rales. HEART:  Irregularly irregular with a grade 2 systolic murmur. ABDOMEN:  Still distended, but clearly improved.  Bowel sounds are active.  No significant tenderness noted now. EXTREMITIES:  Edema persists.  IMPRESSION/PLAN: 1. Ileus, status post colonoscopy and polypectomies, clearly improved.     Continue full liquids for now until gastrointestinal decides to     modify.  Her white count is down to 10.2.  Continue IV antibiotics. 2. Escherichia coli urinary tract infection.  Continue ceftriaxone. 3. Anemia.  Hemoglobin is stable at 8.9. 4. Congestive heart failure.  Continue IV Lasix. 5. Chronic atrial fibrillation.  Continue holding Eliquis.     Paula Compton. Willey Blade, MD     ROF/MEDQ  D:  09/26/2013  T:  October 24, 2013  Job:  [id number]

## 2013-09-27 NOTE — Progress Notes (Signed)
Physical Therapy Treatment Patient Details Name: [name] MRN: [id number] DOB: [birth year] Today's Date: 09/26/2013 Time: 5956-3875 PT Time Calculation (min): 22 min 1 TE 1 TA  PT Assessment / Plan / Recommendation  Patient is very weak today but able to complete seated therex slowly and completed transfer to [facility] and back with Min A. At this point patient was too fatigued to continue further PT such as gait.                                          Progress towards PT Goals Progress towards PT goals: Progressing toward goals  Plan      Precautions / Restrictions Restrictions Weight Bearing Restrictions: No       Mobility  Bed Mobility General bed mobility comments: not tested...pt up in chair. Transfers Overall transfer level: Needs assistance Equipment used: Standard walker Transfers: Stand Pivot Transfers Sit to Stand: Min assist Stand pivot transfers: Min assist General transfer comment: Patient moving extremely slow;but in safe manner, proper hand placement Ambulation/Gait Ambulation Distance (Feet): 0 Feet (Patient states too fatigued)    Exercises General Exercises - Lower Extremity Long Arc Quad: AROM;Both;10 reps;Seated Hip ABduction/ADduction: AROM;Both;Seated;10 reps Toe Raises: AROM;Seated;Both;15 reps Heel Raises: AROM;Seated;Both;15 reps Other Exercises Other Exercises: STS x2 with RW;min A   PT Diagnosis:    PT Problem List:   PT Treatment Interventions:     PT Goals (current goals can now be found in the care plan section)    Visit Information  Last PT Received On: 09/28/2013 Assistance Needed: +1    Subjective Data      Cognition       Balance     End of Session PT - End of Session Equipment Utilized During Treatment: Gait belt Activity Tolerance: Patient limited by fatigue Patient left: in chair;with chair alarm set;with call bell/phone within reach   GP     Marsha Hillman, [place] 10/07/2013, 11:42 AM

## 2013-09-27 NOTE — Progress Notes (Signed)
Subjective:  Denies abdominal pain. +flatus. Decreased distension. No n/v.  Objective: Vital signs in last 24 hours: Temp:  [97.3 F (36.3 C)-98.5 F (36.9 C)] 97.3 F (36.3 C) (03/11 0533) Pulse Rate:  [80-95] 95 (03/11 0533) Resp:  [20] 20 (03/11 0533) BP: (119-130)/(53-74) 119/53 mmHg (03/11 0533) SpO2:  [91 %-100 %] 100 % (03/11 0533) Weight:  [162 lb 0.6 oz (73.5 kg)] 162 lb 0.6 oz (73.5 kg) (03/11 0533) Last BM Date: 09/26/13 General:   Chronically ill-appearing, pleasant and cooperative in NAD. Daughter at bedside. Head:  Normocephalic and atraumatic. Eyes:  Sclera clear, no icterus.   Abdomen:  Soft, nontender and slight distention. Normal bowel sounds, without guarding, and without rebound.  1+pitting edema in dependent flanks. Extremities:  2-3+ pitting edema to thighs bilaterally, lower legs dressed with gauze. Neurologic:  Alert and  oriented x4;  grossly normal neurologically. Skin:  Intact without significant lesions or rashes. Psych:  Alert and cooperative. Normal mood and affect.  Intake/Output from previous day: 03/10 0701 - 03/11 0700 In: -  Out: 200 [Urine:200] Intake/Output this shift:    Lab Results: CBC  Recent Labs  09/26/13 0535  WBC 10.2  HGB 8.9*  HCT 30.5*  MCV 85.7  PLT 94*   BMET  Recent Labs  09/26/13 0535  NA 141  K 4.7  CL 102  CO2 31  GLUCOSE 104*  BUN 15  CREATININE 0.80  CALCIUM 8.4   LFTs No results found for this basename: BILITOT, BILIDIR, IBILI, ALKPHOS, AST, ALT, PROT, ALBUMIN,  in the last 72 hours No results found for this basename: LIPASE,  in the last 72 hours PT/INR No results found for this basename: LABPROT, INR,  in the last 72 hours    Imaging Studies: Ct Abdomen Wo Contrast  October 08, 2013   CLINICAL DATA:  Lethargy.  Distended abdomen.  EXAM: CT ABDOMEN WITHOUT CONTRAST  TECHNIQUE: Multidetector CT imaging of the abdomen was performed following the standard protocol without IV contrast.  COMPARISON:   07/28/2010  FINDINGS: BODY WALL: Unremarkable.  LOWER CHEST: Multi chamber cardiac enlargement. Coronary atherosclerosis. Small bilateral pleural effusions with bibasilar atelectasis. There may also be superimposed consolidation. Haziness of the lungs may reflect generalized atelectasis or mild edema. There is an enlarged prevascular lymph node measuring 21 mm in diameter.  ABDOMEN/PELVIS:  Liver: Questionable lobulation of the liver. No other morphologic changes to suggest cirrhosis.  Biliary: No evidence of biliary obstruction or stone.  Pancreas: Unremarkable.  Spleen: Chronic splenomegaly (since 2006 at least) with a 17 cm craniocaudal span.  Adrenals: Unremarkable.  Kidneys and ureters: Size stable presumed cyst from the lower pole right kidney, measuring 16 mm.  Bladder: Unremarkable.  Reproductive: Unremarkable.  Bowel: The colon is gas distended to the level of the splenic flexure, where there is pericolonic fat infiltration and localized peritoneal fluid. There is no pneumoperitoneum. No pericecal inflammation.  Retroperitoneum: No mass or adenopathy.  Peritoneum: No free fluid or gas.  Vascular: No acute abnormality.  OSSEOUS: There are compression deformities of L1, L2, L3, and L4. These are progressed from 2012, but do not appear acute.  Acute findings were called by telephone at the time of interpretation on 10-08-13 at 6:52 AM to Dr. Hurshel Party , who verbally acknowledged these results.  IMPRESSION: 1. Abnormal splenic flexure, most consistent with colonic injury related to recent colonoscopy. There is localized fluid and inflammation, but no pneumoperitoneum to suggest free perforation. 2. Small bilateral pleural effusions with basilar atelectasis. Cannot  exclude superimposed infection or aspiration. 3. Chronic splenomegaly. There is also mediastinal adenopathy. Suggest outpatient workup for lymphoma.   Electronically Signed   By: Jorje Guild M.D.   On: 09/22/2013 06:57    Dg Abd Acute  W/chest  09/24/2013   CLINICAL DATA:  Abdominal distention.  Recent colonoscopy.  EXAM: ACUTE ABDOMEN SERIES (ABDOMEN 2 VIEW & CHEST 1 VIEW)  COMPARISON:  DG ABD PORTABLE 2V dated 09/23/2013; CT ABDOMEN W/O CM dated 09/22/2013  FINDINGS: Bibasilar chest densities are compatible with pleural fluid and consolidation. Prominent lung markings may represent mild edema. No evidence for free air on the upright view. Images are limited due to body habitus. Again noted is gaseous distention of the transverse colon. Limited evaluation of the pelvis.  IMPRESSION: Bibasilar chest densities are compatible pleural fluid and atelectasis. Cannot exclude mild pulmonary edema.  Gaseous distention of the colon.  Limited evaluation of the pelvis.   Electronically Signed   By: Markus Daft M.D.   On: 09/24/2013 11:31     Assessment: 80 year old female with anemia and heme positive stool in the setting of Eliquis for afib, EGD showing hiatal hernia, gastric erosions (hpylori serologies negative this admission). Colonoscopy showed multiple colon polyps: tubular adenomas. Large rectal polyp actively oozing and was tattooed and had three hemostasis clips placed.   Subsequent ileus in the setting of narcotics, electrolyte imbalance. Unable to exclude a post-polypectomy syndrome presentation but now with more an Ogilvie's type presentation. Unable to tolerate rectal tube. Clinically, she has shown significant improvement. Abdomen obviously less distended; tolerating diet, +flatus.   Anemia: stable.   Plan: 1. No anticoagulation through 3/15th. 2. No MRI until clips have been shown to pass. 3. Continue PPI. 4. Complete five days of Flagyl.   LOS: 9 days   Neil Crouch  09/22/2013, 8:03 AM

## 2013-09-27 NOTE — Discharge Summary (Signed)
[name], [name]:  [id number]  MEDICAL RECORD NO.:  [id number]  LOCATION:                                 FACILITY:  PHYSICIAN:  Paula Compton. Willey Blade, MD       DATE OF BIRTH:  [birth year]  DATE OF ADMISSION:  09/18/2013 DATE OF DISCHARGE:  LH                         DISCHARGE SUMMARY-REFERRING   DISCHARGE DIAGNOSES: 1. Acute-on-chronic diastolic heart failure. 2. Chronic obstructive pulmonary disease. 3. Anemia. 4. Gastritis. 5. Multiple colonic polyps. 6. Ileus. 7. Chronic atrial fibrillation. 8. Escherichia coli urinary tract infection. 9. Hypokalemia. 10.Splenomegaly. 11.Thrombocytopenia. 12.Chronic leukocytosis  DISCHARGE MEDICATIONS: 1. Lasix 80 mg b.i.d. 2. Potassium 20 mEq b.i.d. 3. Pantoprazole 40 mg daily. 4. Albuterol inhaler 2 puffs q.6 h. p.r.n. 5. Refresh Plus 1 drop in each eye as needed. 6. Digoxin 0.125 mg every other day. 7. Diltiazem 240 mg daily. 8. Dulera 100-5, 2 puffs b.i.d. 9. Spiriva inhalation capsule, one capsule daily. 10.Oxygen 4 L by nasal cannula.  HOSPITAL COURSE:  This patient is an 80 year old female who presented with shortness of breath and leg edema.  She was initially hypoxic on 4 L.  She was anemic with a hemoglobin of 7.9.  Chest x-ray revealed CHF. She was hospitalized and treated with IV Lasix.  She had bilateral pleural effusions.  She was seen in consultation by Cardiology.  Her chronic atrial fibrillation was controlled with digoxin and diltiazem. Her ejection fraction is normal by echo.  Her COPD was treated with oxygen and inhaled bronchodilators.  Her anemia was worked up by Gastroenterology.  She was Hemoccult positive.  She underwent an upper endoscopy and a colonoscopy.  She was found to have multiple colonic polyps.  She had a polyp in the rectum, which was actively oozing.  She had been anticoagulated with Eliquis for her chronic atrial fibrillation.  She also had changes of gastritis on her  EGD.  There was no ulcer.  She required transfusion with 2 units of packed red cells.  She tolerated this well.  Her hemoglobin at discharge is 8.9.  She has a stable thrombocytopenia at 94,000.  She has a history of chronic leukocytosis with splenomegaly, previously diagnosed by Hematology, dating back about 10 years.  She has declined additional evaluation of this.  Her white count has improved to 10.2.  She had an E. coli UTI, which was widely resistant, but was sensitive to ceftriaxone.  She has completed her ceftriaxone course.  Following her colonoscopy, she developed a distended abdomen.  She underwent a CT scan, which revealed an inflammatory change in her left colon.  She had no sign of perforation.  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  She was treated with metronidazole along with ceftriaxone.  She developed an ileus.  This has been slow to resolve, but is improved by the 11th.  Her diet has been advanced and she has tolerated this well.  She has been treated for hypokalemia with supplemental potassium.  Her potassium is up from 3.3 to 4.7.  She has had persistent lower extremity edema despite diuretics.  She has been resistant to compression stocking use.  Her H. pylori IgG was negative less  than 0.40.  Her condition has improved to the point that she is stable for transfer to skilled care.  She will require rehab there.  She will need continued oxygen.  She is very dyspneic with minimal exertion now.  We will continue her Foley catheter for now.     Paula Compton. Willey Blade, MD     ROF/MEDQ  D:  10/14/2013  T:  09/26/2013  Job:  [id number]

## 2013-09-27 NOTE — Clinical Social Work Note (Signed)
Presented bed offers and pt chooses [facility]. Facility notified. Aware of d/c today. Pt to transfer with RN. D/C summary faxed.  Benay Pike, [place]

## 2013-09-27 NOTE — Clinical Social Work Placement (Signed)
Clinical Social Work Department CLINICAL SOCIAL WORK PLACEMENT NOTE 10-21-2013  Patient:  [name], [name]  Account Number:  [id number] Admit date:  09/18/2013  Clinical Social Worker:  Benay Pike, LCSW  Date/time:  09/26/2013 03:56 PM  Clinical Social Work is seeking post-discharge placement for this patient at the following level of care:   [place]   (*CSW will update this form in Epic as items are completed)   09/26/2013  Patient/family provided with [place] Department of Clinical Social Work's list of facilities offering this level of care within the geographic area requested by the patient (or if unable, by the patient's family).  09/26/2013  Patient/family informed of their freedom to choose among providers that offer the needed level of care, that participate in Medicare, Medicaid or managed care program needed by the patient, have an available bed and are willing to accept the patient.  09/26/2013  Patient/family informed of [facility]' ownership interest in [facility], as well as of the fact that they are under no obligation to receive care at this facility.  PASARR submitted to EDS on 09/26/2013 PASARR number received from EDS on 09/26/2013  FL2 transmitted to all facilities in geographic area requested by pt/family on  09/26/2013 FL2 transmitted to all facilities within larger geographic area on   Patient informed that his/her managed care company has contracts with or will negotiate with  certain facilities, including the following:     Patient/family informed of bed offers received:  October 21, 2013 Patient chooses bed at [facility] Physician recommends and patient chooses bed at  [facility]  Patient to be transferred to [facility] on  10-21-2013 Patient to be transferred to facility by RN  The following physician request were entered in Epic:   Additional Comments:  Benay Pike, [place]

## 2013-09-27 NOTE — ED Provider Notes (Signed)
CSN: [id number]     Arrival date & time 09-30-2013  2158 History  This chart was scribed for Sharyon Cable, MD by Elby Beck, ED Scribe. This patient was seen in room APA09/APA09 and the patient's care was started at 10:20 PM.    Chief Complaint  Patient presents with  . Shortness of Breath    Patient is a 80 y.o. female presenting with shortness of breath. The history is provided by the patient. The history is limited by the condition of the patient (severe distress). No language interpreter was used.  Shortness of Breath Severity:  Severe Onset quality:  Gradual Duration:  1 day Timing:  Constant Chronicity:  Recurrent (history of COPD) Relieved by:  Oxygen (some relief with oxygen given in the ED) Worsened by:  Nothing tried Ineffective treatments:  None tried  LEVEL 5 CAVEAT (Severe Respiratory Distress)  HPI Comments: SHINA WASS is a 80 y.o. female with a history of COPD, chronic Afib, CHF and HTN who presents to the Emergency Department complaining of acutely worsened SOB today. She reports an associated cough, and states that she has not been coughing up blood. She also reports associated nausea. Her pulse ox was 81% upon arrival to the ED. She was placed on supplemental oxygen via a nasal cannula in the ED which has so far brought her pulse ox up to 90%. She denies chest pain, fever, leg swelling or any other symptoms.  Her history is limited due to resp distress   Past Medical History  Diagnosis Date  . Coronary atherosclerosis of native coronary artery     PTCA LAD 1998; no significant disease in circumflex or RCA 1998  . Chronic atrial fibrillation   . Chronic anticoagulation   . Hyperlipidemia   . Hyperparathyroidism     Parathyroidectomy  . Nephrolithiasis   . Degenerative joint disease     Chronic low back pain; knee pain  . Gout   . Essential hypertension, benign   . COPD (chronic obstructive pulmonary disease)    Past Surgical History  Procedure  Laterality Date  . Parathyroidectomy  1970s  . Orif hip fracture  2004    Left hip, following fracture  . Knee arthroscopy      Bilateral  . Kidney stone surgery      x 2  . Total abdominal hysterectomy  Late 1960s  . Colonoscopy  Prior to 2003  . Cardioversion    . Colonoscopy with esophagogastroduodenoscopy (egd) N/A 09/21/2013    Procedure: COLONOSCOPY WITH ESOPHAGOGASTRODUODENOSCOPY (EGD);  Surgeon: Daneil Dolin, MD;  Location: [facility];  Service: Endoscopy;  Laterality: N/A;   Family History  Problem Relation Age of Onset  . Heart attack Mother   . Heart disease Mother   . Hypertension Mother   . Heart attack Father   . Heart disease Father   . Emphysema Father   . Asthma Daughter   . Hypertension Sister   . Cancer Brother   . Asthma Brother   . Emphysema Brother   . Hypertension Sister   . Diabetes Sister   . Kidney disease Sister   . Hypertension Sister   . Cancer Sister   . Hypertension Sister   . Cancer Sister   . Colon cancer Brother     diagnosed in his early 47s   History  Substance Use Topics  . Smoking status: Never Smoker   . Smokeless tobacco: Not on file  . Alcohol Use: No   OB  History   Grav Para Term Preterm Abortions TAB SAB Ect Mult Living                 Review of Systems  Unable to perform ROS: Severe respiratory distress  Respiratory: Positive for shortness of breath.    Allergies  Baycol; Iohexol; Shrimp; Statins; and Toprol xl  Home Medications   Current Outpatient Rx  Name  Route  Sig  Dispense  Refill  . furosemide (LASIX) 80 MG tablet   Oral   Take 80 mg by mouth 2 (two) times daily.         Marland Kitchen albuterol (PROVENTIL HFA;VENTOLIN HFA) 108 (90 BASE) MCG/ACT inhaler   Inhalation   Inhale 2 puffs into the lungs every 6 (six) hours as needed for wheezing.   1 Inhaler   6   . carboxymethylcellulose (REFRESH PLUS) 0.5 % SOLN   Ophthalmic   Apply 1 drop to eye as needed (for dry eye relief).          Marland Kitchen digoxin  (LANOXIN) 0.125 MG tablet   Oral   Take 0.125 mg by mouth every other day.         . diltiazem (CARDIZEM CD) 240 MG 24 hr capsule   Oral   Take 1 tablet by mouth every morning.          . furosemide (LASIX) 20 MG tablet   Oral   Take 4 tablets (80 mg total) by mouth 2 (two) times daily.   60 tablet   12   . mometasone-formoterol (DULERA) 100-5 MCG/ACT AERO   Inhalation   Inhale 2 puffs into the lungs 2 (two) times daily.   13 g   6   . pantoprazole (PROTONIX) 40 MG tablet   Oral   Take 1 tablet (40 mg total) by mouth daily.   30 tablet   3   . potassium chloride SA (K-DUR,KLOR-CON) 20 MEQ tablet   Oral   Take 20 mEq by mouth 2 (two) times daily.         Marland Kitchen tiotropium (SPIRIVA) 18 MCG inhalation capsule   Inhalation   Place 1 capsule (18 mcg total) into inhaler and inhale daily.   30 capsule   6    Triage Vitals: BP 145/55  Pulse 102  Temp(Src) 98.1 F (36.7 C) (Oral)  Wt 162 lb (73.483 kg)  SpO2 81%  Physical Exam  Nursing note and vitals reviewed. CONSTITUTIONAL: elderly, ill-appearing, tachypneic HEAD: Normocephalic/atraumatic EYES: EOMI ENMT: Mucous membranes moist NECK: supple no meningeal signs CV: tachycardic, irregular, no loud murmurs LUNGS: Lungs are clear to auscultation bilaterally, no apparent distress ABDOMEN: soft, nontender, no rebound or guarding GU:no cva tenderness NEURO: Pt is awake/alert, moves all extremitiesx4 EXTREMITIES: pulses normal, full ROM, chronic pitting edema to bilateral lower extremities that is symmetric SKIN: warm, color normal PSYCH: no abnormalities of mood noted   ED Course  Procedures   DIAGNOSTIC STUDIES: Oxygen Saturation is 81% on RA, low by my interpretation.    COORDINATION OF CARE: 10:25 PM- Discussed plan to obtain a CXR, along with diagnostic lab work. Will also order Zofran. Pt advised of plan for treatment and pt agrees..  12:04 AM Pt now improving.  She had been given lasix prior to  arrival She still has increased O2 requirement.  Her CXR is worse from prior and BNP elevating Will admit for CHF exacerbation D/w dr Darrick Meigs, will admit for dr fagan Also discussed case with family Currently pt is  full code per paperwork BP 111/40  Pulse 96  Temp(Src) 98.1 F (36.7 C) (Oral)  Resp 26  Wt 162 lb (73.483 kg)  SpO2 95%   Medications  ondansetron (ZOFRAN-ODT) 4 MG disintegrating tablet (not administered)  ondansetron (ZOFRAN-ODT) disintegrating tablet 4 mg (4 mg Oral Given 09-28-2013 2227)   Labs Review Labs Reviewed  CBC WITH DIFFERENTIAL - Abnormal; Notable for the following:    WBC 14.8 (*)    Hemoglobin 10.0 (*)    HCT 33.6 (*)    MCH 25.3 (*)    MCHC 29.8 (*)    RDW 17.1 (*)    Platelets 100 (*)    Lymphocytes Relative 5 (*)    Monocytes Relative 20 (*)    Neutro Abs 11.0 (*)    Monocytes Absolute 3.0 (*)    All other components within normal limits  BASIC METABOLIC PANEL - Abnormal; Notable for the following:    Glucose, Bld 134 (*)    Calcium 7.9 (*)    GFR calc non Af Amer 59 (*)    GFR calc Af Amer 69 (*)    All other components within normal limits  DIGOXIN LEVEL - Abnormal; Notable for the following:    Digoxin Level 0.6 (*)    All other components within normal limits  PRO B NATRIURETIC PEPTIDE - Abnormal; Notable for the following:    Pro B Natriuretic peptide (BNP) 2121.0 (*)    All other components within normal limits  TROPONIN I   Imaging Review Dg Chest Portable 1 View  09/28/13   CLINICAL DATA Shortness of breath and nausea  EXAM PORTABLE CHEST - 1 VIEW  COMPARISON Prior radiograph from 09/24/2013  FINDINGS Cardiomegaly is stable as compared to prior study.  Lungs are hypoinflated. There is diffuse pulmonary vascular congestion with indistinctness of the interstitial markings, most compatible with pulmonary edema. Small moderate bilateral pleural effusions are present. Associated bibasilar lung opacities are favored to reflect associated  atelectasis and/or edema or effusion. No definite focal infiltrate identified. No pneumothorax.  No acute osseous abnormality.  IMPRESSION 1. Cardiomegaly with diffuse pulmonary vascular congestion and indistinctness of the interstitial markings, most compatible with pulmonary edema. 2. Small to moderate bilateral pleural effusions. Associated bibasilar opacities are favored to reflect atelectasis, effusion, and/ or edema. No definite focal infiltrate.  SIGNATURE  Electronically Signed   By: Rise Mu M.D.   On: September 28, 2013 23:05     EKG Interpretation   Date/Time:  Wednesday 09/28/2013 22:08:09 EDT Ventricular Rate:  103 PR Interval:    QRS Duration: 88 QT Interval:  338 QTC Calculation: 442 R Axis:   124 Text Interpretation:  Atrial fibrillation with rapid ventricular response  with premature ventricular or aberrantly conducted complexes Right axis  deviation Low voltage QRS Cannot rule out Anterior infarct , age  undetermined Abnormal ECG When compared with ECG of 18-Sep-2013 16:11,  Nonspecific T wave abnormality no longer evident in Anterior leads  Confirmed by Bebe Shaggy  MD, Dorinda Hill ([id number]) on 09/28/13 10:18:52 PM      MDM   Final diagnoses:  Acute CHF    Nursing notes including past medical history and social history reviewed and considered in documentation xrays reviewed and considered Labs/vital reviewed and considered Previous records reviewed and considered    I personally performed the services described in this documentation, which was scribed in my presence. The recorded information has been reviewed and is accurate.   Joya Gaskins, MD 09/28/13 0005

## 2013-09-27 NOTE — Progress Notes (Signed)
Patient ID: [name], female   DOB: January 31, 1927, 80 y.o.   MRN: [id number]    Primary cardiologist:  Subjective:    Reports some SOB this morning, winded after getting back and forth from the commode  Objective:   Temp:  [97.3 F (36.3 C)-98.5 F (36.9 C)] 97.3 F (36.3 C) (03/11 0533) Pulse Rate:  [80-95] 95 (03/11 0533) Resp:  [20] 20 (03/11 0533) BP: (119-130)/(53-74) 119/53 mmHg (03/11 0533) SpO2:  [91 %-100 %] 96 % (03/11 0811) Weight:  [162 lb 0.6 oz (73.5 kg)] 162 lb 0.6 oz (73.5 kg) (03/11 0533) Last BM Date: 09/26/13  Filed Weights   09/25/13 0405 09/26/13 0540 10/12/2013 0533  Weight: 167 lb 5.3 oz (75.9 kg) 167 lb 1.7 oz (75.8 kg) 162 lb 0.6 oz (73.5 kg)    Intake/Output Summary (Last 24 hours) at 09/24/2013 0836 Last data filed at 09/26/13 1524  Gross per 24 hour  Intake      0 ml  Output    200 ml  Net   -200 ml    Telemetry: Afib, rates 110s-120s  Exam:  General: NAD  Resp:faint crackles in bases  Cardiac: irreg, no m/r/g, rate 105, +JVD  GI: abdomen soft, NT, ND  MSK: 3+ LE edema  Neuro: no focal deficits   Lab Results:  Basic Metabolic Panel:  Recent Labs Lab 09/22/13 0500 09/23/13 1052 09/24/13 0604 09/26/13 0535  NA 140 137 138 141  K 3.7 4.0 3.3* 4.7  CL 98 95* 97 102  CO2 30 32 31 31  GLUCOSE 91 145* 111* 104*  BUN 22 21 18 15   CREATININE 0.91 0.96 0.84 0.80  CALCIUM 8.2* 8.2* 8.0* 8.4  MG  --  2.0  --   --     Liver Function Tests:  Recent Labs Lab 09/23/13 1052  AST 14  ALT 11  ALKPHOS 112  BILITOT 0.6  PROT 6.6  ALBUMIN 3.0*    CBC:  Recent Labs Lab 09/23/13 1052 09/24/13 0604 09/26/13 0535  WBC 15.8* 12.1* 10.2  HGB 9.0* 8.7* 8.9*  HCT 31.1* 29.6* 30.5*  MCV 85.4 85.3 85.7  PLT 100* 89* 94*    Cardiac Enzymes: No results found for this basename: CKTOTAL, CKMB, CKMBINDEX, TROPONINI,  in the last 168 hours  BNP:  Recent Labs  09/18/13 1557  PROBNP 1451.0*    Coagulation: No results  found for this basename: INR,  in the last 168 hours  ECG:   Medications:   Scheduled Medications: . albuterol  2.5 mg Nebulization TID  . digoxin  0.125 mg Oral QODAY  . diltiazem  240 mg Oral Daily  . furosemide  40 mg Intravenous Q12H  . mometasone-formoterol  2 puff Inhalation BID  . pantoprazole  40 mg Oral Daily  . sodium chloride  3 mL Intravenous Q12H  . tiotropium  18 mcg Inhalation Daily     Infusions:     PRN Medications:  sodium chloride, acetaminophen, albuterol, sodium chloride, traZODone     Assessment/Plan    1. Acute on chronic diastolic heart failure  - echo 09/19/13 LVEF 60-65%, unable to eval diastolic function, mild-mod AS, mod MR, and severe pulm HTN  - she is net negative 5.2 liters since admission, negative 200 mL yesterday. Cr stable, she is on lasix 40mg  IV bid. Still with significant LE edema that is chronic, rest of exam appears volume status has improved  - decreased response to current diuretic dosing, still with signs of volume  overload. Will increase IV lasix to 60mg  IV bid.   2. Afib  - no current symptoms  - off anticoag in setting of anemia, continue to follow  - continue dig and diltiazem, mildly elevated rates overnight and this morning. If persists will uptitrate her diltiazem.   3. Pulmonary hypertension with right side dysfunction  - likely multifactorial, related to suspected left sided disease and severe COPD.  - continue diuresis   4. Anemia  - EGD with gastric erosions, colonoscopy with multiple polypis with actively oozing large rectal polyp.  - continue to hold anticoag, per GI ok to resume 3/15.   5. Ileus  - management per GI        Carlyle Dolly, M.D., F.A.C.C.

## 2013-09-27 NOTE — ED Notes (Signed)
ATTEMPTED TO TITRATE O2 DOWN TO 3L INSTEAD OF 4L.Marland Kitchen PT UNABLE TO TOLERATE, O2 DROPPED 89%. INCREASED O2 BACK TO 4L.

## 2013-09-27 NOTE — Progress Notes (Addendum)
Pt is to be discharged to the [facility] today. Pt is in NAD, IV is out, all paperwork has been reviewed/discussed with patient, and there are no questions/concerns at this time. Assessment is unchanged from this morning. Report has been called to receiving nurse. Pt is to be accompanied downstairs by staff and family via wheelchair.

## 2013-09-28 ENCOUNTER — Encounter (HOSPITAL_COMMUNITY): Payer: Self-pay | Admitting: Emergency Medicine

## 2013-09-28 DIAGNOSIS — I5033 Acute on chronic diastolic (congestive) heart failure: Principal | ICD-10-CM

## 2013-09-28 DIAGNOSIS — I4891 Unspecified atrial fibrillation: Secondary | ICD-10-CM

## 2013-09-28 DIAGNOSIS — I509 Heart failure, unspecified: Secondary | ICD-10-CM | POA: Diagnosis present

## 2013-09-28 DIAGNOSIS — D649 Anemia, unspecified: Secondary | ICD-10-CM

## 2013-09-28 DIAGNOSIS — L0390 Cellulitis, unspecified: Secondary | ICD-10-CM | POA: Diagnosis present

## 2013-09-28 LAB — COMPREHENSIVE METABOLIC PANEL
ALBUMIN: 2.7 g/dL — AB (ref 3.5–5.2)
ALT: 8 U/L (ref 0–35)
AST: 19 U/L (ref 0–37)
Alkaline Phosphatase: 106 U/L (ref 39–117)
BILIRUBIN TOTAL: 0.7 mg/dL (ref 0.3–1.2)
BUN: 17 mg/dL (ref 6–23)
CHLORIDE: 102 meq/L (ref 96–112)
CO2: 32 meq/L (ref 19–32)
Calcium: 7.9 mg/dL — ABNORMAL LOW (ref 8.4–10.5)
Creatinine, Ser: 1.1 mg/dL (ref 0.50–1.10)
GFR calc Af Amer: 52 mL/min — ABNORMAL LOW (ref >90)
GFR, EST NON AFRICAN AMERICAN: 45 mL/min — AB (ref >90)
Glucose, Bld: 124 mg/dL — ABNORMAL HIGH (ref 70–99)
Potassium: 4.5 mEq/L (ref 3.7–5.3)
SODIUM: 142 meq/L (ref 137–147)
Total Protein: 5.6 g/dL — ABNORMAL LOW (ref 6.0–8.3)

## 2013-09-28 LAB — CBC
HCT: 38.7 % (ref 36.0–46.0)
Hemoglobin: 11 g/dL — ABNORMAL LOW (ref 12.0–15.0)
MCH: 24.3 pg — ABNORMAL LOW (ref 26.0–34.0)
MCHC: 28.4 g/dL — ABNORMAL LOW (ref 30.0–36.0)
MCV: 85.6 fL (ref 78.0–100.0)
PLATELETS: 117 10*3/uL — AB (ref 150–400)
RBC: 4.52 MIL/uL (ref 3.87–5.11)
RDW: 17.3 % — ABNORMAL HIGH (ref 11.5–15.5)
WBC: 19.5 10*3/uL — AB (ref 4.0–10.5)

## 2013-09-28 MED ORDER — PANTOPRAZOLE SODIUM 40 MG PO TBEC
40.0000 mg | DELAYED_RELEASE_TABLET | Freq: Every day | ORAL | Status: DC
  Administered 2013-09-28 – 2013-10-01 (×4): 40 mg via ORAL
  Filled 2013-09-28 (×4): qty 1

## 2013-09-28 MED ORDER — DILTIAZEM HCL ER COATED BEADS 180 MG PO CP24
300.0000 mg | ORAL_CAPSULE | Freq: Every morning | ORAL | Status: DC
  Administered 2013-09-28 – 2013-09-29 (×2): 300 mg via ORAL
  Filled 2013-09-28 (×4): qty 1

## 2013-09-28 MED ORDER — ACETAMINOPHEN 325 MG PO TABS
650.0000 mg | ORAL_TABLET | Freq: Four times a day (QID) | ORAL | Status: DC | PRN
  Administered 2013-09-28 – 2013-09-29 (×2): 650 mg via ORAL
  Filled 2013-09-28 (×2): qty 2

## 2013-09-28 MED ORDER — POTASSIUM CHLORIDE CRYS ER 20 MEQ PO TBCR
20.0000 meq | EXTENDED_RELEASE_TABLET | Freq: Two times a day (BID) | ORAL | Status: DC
  Administered 2013-09-28 (×3): 20 meq via ORAL
  Filled 2013-09-28 (×4): qty 1

## 2013-09-28 MED ORDER — SODIUM CHLORIDE 0.9 % IJ SOLN
3.0000 mL | INTRAMUSCULAR | Status: DC | PRN
  Administered 2013-09-29: 3 mL via INTRAVENOUS

## 2013-09-28 MED ORDER — ALPRAZOLAM 0.25 MG PO TABS
0.2500 mg | ORAL_TABLET | Freq: Once | ORAL | Status: AC
  Administered 2013-09-28: 0.25 mg via ORAL
  Filled 2013-09-28: qty 1

## 2013-09-28 MED ORDER — DIGOXIN 125 MCG PO TABS
0.1250 mg | ORAL_TABLET | ORAL | Status: DC
  Administered 2013-09-28 – 2013-09-30 (×2): 0.125 mg via ORAL
  Filled 2013-09-28 (×2): qty 1

## 2013-09-28 MED ORDER — FUROSEMIDE 10 MG/ML IJ SOLN
20.0000 mg | Freq: Two times a day (BID) | INTRAMUSCULAR | Status: DC
  Administered 2013-09-28: 20 mg via INTRAVENOUS
  Filled 2013-09-28: qty 2

## 2013-09-28 MED ORDER — VANCOMYCIN HCL 10 G IV SOLR
1250.0000 mg | Freq: Once | INTRAVENOUS | Status: AC
  Administered 2013-09-28: 1250 mg via INTRAVENOUS
  Filled 2013-09-28: qty 1250

## 2013-09-28 MED ORDER — BUDESONIDE-FORMOTEROL FUMARATE 80-4.5 MCG/ACT IN AERO
2.0000 | INHALATION_SPRAY | Freq: Two times a day (BID) | RESPIRATORY_TRACT | Status: DC
  Administered 2013-09-28 – 2013-10-01 (×6): 2 via RESPIRATORY_TRACT
  Filled 2013-09-28: qty 6.9

## 2013-09-28 MED ORDER — ALBUTEROL SULFATE (2.5 MG/3ML) 0.083% IN NEBU: 2.5000 mg | INHALATION_SOLUTION | Freq: Four times a day (QID) | RESPIRATORY_TRACT | Status: DC | PRN

## 2013-09-28 MED ORDER — FUROSEMIDE 10 MG/ML IJ SOLN
40.0000 mg | Freq: Two times a day (BID) | INTRAMUSCULAR | Status: DC
  Administered 2013-09-28 (×2): 40 mg via INTRAVENOUS
  Filled 2013-09-28 (×3): qty 4

## 2013-09-28 MED ORDER — VANCOMYCIN HCL IN DEXTROSE 750-5 MG/150ML-% IV SOLN
INTRAVENOUS | Status: AC
  Filled 2013-09-28: qty 150

## 2013-09-28 MED ORDER — SODIUM CHLORIDE 0.9 % IJ SOLN
3.0000 mL | Freq: Two times a day (BID) | INTRAMUSCULAR | Status: DC
  Administered 2013-09-28 – 2013-10-01 (×7): 3 mL via INTRAVENOUS

## 2013-09-28 MED ORDER — ENOXAPARIN SODIUM 40 MG/0.4ML ~~LOC~~ SOLN
40.0000 mg | SUBCUTANEOUS | Status: DC
  Administered 2013-09-28 – 2013-09-29 (×2): 40 mg via SUBCUTANEOUS
  Filled 2013-09-28 (×2): qty 0.4

## 2013-09-28 MED ORDER — VANCOMYCIN HCL IN DEXTROSE 750-5 MG/150ML-% IV SOLN
750.0000 mg | INTRAVENOUS | Status: DC
  Administered 2013-09-28: 750 mg via INTRAVENOUS
  Filled 2013-09-28 (×4): qty 150

## 2013-09-28 MED ORDER — DILTIAZEM HCL ER COATED BEADS 240 MG PO CP24: 240.0000 mg | ORAL_CAPSULE | Freq: Every morning | ORAL | Status: DC

## 2013-09-28 MED ORDER — SODIUM CHLORIDE 0.9 % IV SOLN: 250.0000 mL | INTRAVENOUS | Status: DC | PRN

## 2013-09-28 MED ORDER — MOMETASONE FURO-FORMOTEROL FUM 100-5 MCG/ACT IN AERO: 2.0000 | INHALATION_SPRAY | Freq: Two times a day (BID) | RESPIRATORY_TRACT | Status: DC

## 2013-09-28 MED ORDER — VANCOMYCIN HCL IN DEXTROSE 750-5 MG/150ML-% IV SOLN
750.0000 mg | Freq: Once | INTRAVENOUS | Status: DC
  Filled 2013-09-28: qty 150

## 2013-09-28 MED ORDER — ACETAMINOPHEN 650 MG RE SUPP: 650.0000 mg | Freq: Four times a day (QID) | RECTAL | Status: DC | PRN

## 2013-09-28 MED ORDER — TIOTROPIUM BROMIDE MONOHYDRATE 18 MCG IN CAPS
18.0000 ug | ORAL_CAPSULE | Freq: Every day | RESPIRATORY_TRACT | Status: DC
  Administered 2013-09-28 – 2013-10-01 (×4): 18 ug via RESPIRATORY_TRACT
  Filled 2013-09-28 (×2): qty 5

## 2013-09-28 NOTE — Progress Notes (Signed)
ANTIBIOTIC CONSULT NOTE- Follow up  Pharmacy Consult for Vancomycin Indication: Cellulitis  Allergies  Allergen Reactions  . Baycol [Cerivastatin Sodium] Other (See Comments)    Rhabdomyolysis   . Iohexol      Desc: PT GOT VERY SOB DURING CARDIAC CATH IN 1998/ STATES THEY HAD TO STOP DOING THE CATH DUE TO THIS/ ALSO PRODUCED SEVERE HEADACHE FOR WEEKS/ PT REFUSING FURTHER CONTRAST/ TSF   . Shrimp [Shellfish Allergy] Other (See Comments)    gout  . Statins   . Toprol Xl [Metoprolol Succinate]     Cough and extreme SOB   Patient Measurements: Height: 5' (152.4 cm) Weight: 161 lb 11.2 oz (73.347 kg) IBW/kg (Calculated) : 45.5  Vital Signs: Temp: 98.4 F (36.9 C) (03/12 0532) Temp src: Oral (03/12 0532) BP: 102/64 mmHg (03/12 0900) Pulse Rate: 110 (03/12 0900)  Labs:  Recent Labs  09/26/13 0535 22-Oct-2013 2249 09/28/13 0629  WBC 10.2 14.8* 19.5*  HGB 8.9* 10.0* 11.0*  PLT 94* 100* 117*  CREATININE 0.80 0.87 1.10   Estimated Creatinine Clearance: 34 ml/min (by C-G formula based on Cr of 1.1).  No results found for this basename: VANCOTROUGH, Corlis Leak, VANCORANDOM, GENTTROUGH, GENTPEAK, GENTRANDOM, TOBRATROUGH, TOBRAPEAK, TOBRARND, AMIKACINPEAK, AMIKACINTROU, AMIKACIN,  in the last 72 hours   Microbiology: Recent Results (from the past 720 hour(s))  URINE CULTURE     Status: None   Collection Time    09/19/13  1:45 PM      Result Value Ref Range Status   Specimen Description URINE, CLEAN CATCH   Final   Special Requests NONE   Final   Culture  Setup Time     Final   Value: 09/19/2013 15:30     Performed at [place]     Final   Value: 70,000 COLONIES/ML     Performed at [facility]   Culture     Final   Value: ESCHERICHIA COLI     Performed at [facility]   Report Status 09/21/2013 FINAL   Final   Organism ID, Bacteria ESCHERICHIA COLI   Final   Medical History: Past Medical History  Diagnosis Date  . Coronary  atherosclerosis of native coronary artery     PTCA LAD 1998; no significant disease in circumflex or RCA 1998  . Chronic atrial fibrillation   . Chronic anticoagulation   . Hyperlipidemia   . Hyperparathyroidism     Parathyroidectomy  . Nephrolithiasis   . Degenerative joint disease     Chronic low back pain; knee pain  . Gout   . Essential hypertension, benign   . COPD (chronic obstructive pulmonary disease)    Assessment: 80 yo female admitted for SOB, acute CHF, and with R lower extremity erythema. WBCs elevated. IV Vancomycin to be started for cellulitis.  Estimated Creatinine Clearance: 34 ml/min (by C-G formula based on Cr of 1.1). Protocol will be initiated with a one-time dose of Vancomycin 1250 mg IV when admitted.  Goal of Therapy:  Vancomycin troughs 10-15 mcg/ml  Plan:   Vancomycin 750mg  IV q24hrs  Check trough at steady state  Monitor labs, renal fxn, and cultures  [place] Crane, Clayson Riling A, RPH 09/28/2013,10:45 AM .

## 2013-09-28 NOTE — Clinical Social Work Note (Signed)
CSW ran into pt's daughter in hallway and discussed d/c plan. She reports that pt was anxious being at [facility] and they would like to take pt home instead of returning to [facility]. They have already talked about hiring a RN for nights and family will be present during the day. CSW notified [facility] with her permission. CM aware. Will sign off, but can be reconsulted if needed.  Benay Pike, [place]

## 2013-09-28 NOTE — Progress Notes (Signed)
Consulting cardiologist: Carlyle Dolly MD Primary Cardiologist: Carlyle Dolly MD  Subjective:   Tired, weak, short of breath   Objective:   Temp:  [97.7 F (36.5 C)-98.4 F (36.9 C)] 98.4 F (36.9 C) (03/12 0532) Pulse Rate:  [91-114] 114 (03/12 0532) Resp:  [14-32] 14 (03/12 0532) BP: (100-145)/(40-70) 139/70 mmHg (03/12 0532) SpO2:  [81 %-97 %] 97 % (03/12 0532) Weight:  [161 lb 11.2 oz (73.347 kg)-162 lb (73.483 kg)] 161 lb 11.2 oz (73.347 kg) (03/12 0143) Last BM Date: 10-19-2013  [facility] Weights   2013/10/19 2159 09/28/13 0143  Weight: 162 lb (73.483 kg) 161 lb 11.2 oz (73.347 kg)    Intake/Output Summary (Last 24 hours) at 09/28/13 0951 Last data filed at 09/28/13 0239  Gross per 24 hour  Intake      3 ml  Output      0 ml  Net      3 ml    Telemetry: Atrial fib 100- 115 bpm  Exam:  General: No acute distress. Frail  HEENT: Conjunctiva and lids normal, oropharynx clear.  Lungs: Clear to auscultation diminished in the bases, with poor respiratory effort. Cardiac: No elevated JVP or bruits. RRR, no gallop or rub.   Abdomen: Normoactive bowel sounds, nontender, nondistended.  Extremities: Venous stasis skin thickening, with erythema, 1+-2+ edema, distal pulses full.  Neuropsychiatric: Alert and oriented x3, affect appropriate.   Lab Results:  Basic Metabolic Panel:  Recent Labs Lab 09/22/13 0500 09/23/13 1052  09/26/13 0535 10-19-13 2249 09/28/13 0629  NA 140 137  < > 141 137 142  K 3.7 4.0  < > 4.7 3.9 4.5  CL 98 95*  < > 102 100 102  CO2 30 32  < > 31 25 32  GLUCOSE 91 145*  < > 104* 134* 124*  BUN 22 21  < > 15 15 17   CREATININE 0.91 0.96  < > 0.80 0.87 1.10  CALCIUM 8.2* 8.2*  < > 8.4 7.9* 7.9*  MG  --  2.0  --   --   --   --   < > = values in this interval not displayed.  Liver Function Tests:  Recent Labs Lab 09/23/13 1052 09/28/13 0629  AST 14 19  ALT 11 8  ALKPHOS 112 106  BILITOT 0.6 0.7  PROT 6.6 5.6*  ALBUMIN 3.0*  2.7*    CBC:  Recent Labs Lab 09/26/13 0535 2013/10/19 2249 09/28/13 0629  WBC 10.2 14.8* 19.5*  HGB 8.9* 10.0* 11.0*  HCT 30.5* 33.6* 38.7  MCV 85.7 85.1 85.6  PLT 94* 100* 117*    Cardiac Enzymes:  Recent Labs Lab 19-Oct-2013 2322  TROPONINI <0.30    BNP:  Recent Labs  09/18/13 1557 10/19/13 2249  PROBNP 1451.0* 2121.0*    Radiology: Dg Chest Portable 1 View  October 19, 2013   CLINICAL DATA Shortness of breath and nausea  EXAM PORTABLE CHEST - 1 VIEW  COMPARISON Prior radiograph from 09/24/2013  FINDINGS Cardiomegaly is stable as compared to prior study.  Lungs are hypoinflated. There is diffuse pulmonary vascular congestion with indistinctness of the interstitial markings, most compatible with pulmonary edema. Small moderate bilateral pleural effusions are present. Associated bibasilar lung opacities are favored to reflect associated atelectasis and/or edema or effusion. No definite focal infiltrate identified. No pneumothorax.  No acute osseous abnormality.  IMPRESSION 1. Cardiomegaly with diffuse pulmonary vascular congestion and indistinctness of the interstitial markings, most compatible with pulmonary edema. 2. Small to moderate bilateral pleural  effusions. Associated bibasilar opacities are favored to reflect atelectasis, effusion, and/ or edema. No definite focal infiltrate.  SIGNATURE  Electronically Signed   By: Jeannine Boga M.D.   On: 09/20/2013 23:05     Medications:   Scheduled Medications: . budesonide-formoterol  2 puff Inhalation BID  . digoxin  0.125 mg Oral QODAY  . diltiazem  240 mg Oral q morning - 10a  . enoxaparin (LOVENOX) injection  40 mg Subcutaneous Q24H  . furosemide  40 mg Intravenous BID  . pantoprazole  40 mg Oral Daily  . potassium chloride SA  20 mEq Oral BID  . sodium chloride  3 mL Intravenous Q12H  . tiotropium  18 mcg Inhalation Daily  . vancomycin  750 mg Intravenous Q24H        PRN Medications: sodium chloride,  acetaminophen, acetaminophen, albuterol, sodium chloride   Assessment and Plan:   1.Acute on Chronic Diastolic CHF: She continues to have some LEE, but improved. Lasix was increased to 60 mg BID yesterday, but review of medications shows she remains on 40 mg BID. Urine output not recorded. Wt is down 1 lb per documentation. Will request strict I/O. Leave her at lasix 40 mg BID as edema is somewhat improved. Follow BMET.  2. Atrial fibrillation:  Heart rate is not controlled. Question due to deconditioning. BP is 139/70 this am. Will titrate up diltiazem to 300 mg daily.  No anticoagulation due to anemia. Hgb is improved on today's labs. Awaiting restart per GI recommendation on 10/23/13.   3.COPD: Remains easily dyspneic. Consider PT for strengthening.  4. Anemia: Improved. No changes at this time.  Phill Myron. Lawrence NP [facility] 09/28/2013, 9:51 AM  Patient seen and discussed with NP Lawrence Acute on chronic diastolic heart failure echo 09/19/13 LVEF 60-65%, unable to eval diastolic function, mild-mod AS, mod MR, and severe pulm HTN  - she diuresed over 5 liters and was discharged, returned with SOB. CXR shows stable pulmonary edema, she has chronic LE edema. Will continue IV diuretics today to bring her volume status down further, potentially convert to oral tomorrow. Will titrate up dilt in setting of afib and elevated heart rates. Remains off anticoag due to anemia.   Carlyle Dolly MD

## 2013-09-28 NOTE — Clinical Social Work Psychosocial (Signed)
Clinical Social Work Department BRIEF PSYCHOSOCIAL ASSESSMENT 09/28/2013  Patient:  [name], [name]     Account Number:  [id number]     Admit date:  09/18/2013  Clinical Social Worker:  Wyatt Haste  Date/Time:  09/28/2013 09:03 AM  Referred by:  CSW  Date Referred:  09/28/2013 Referred for  SNF Placement   Other Referral:   Interview type:  Patient Other interview type:    PSYCHOSOCIAL DATA Living Status:  FACILITY Admitted from facility:  [facility] Level of care:  [place] Primary support name:  Kendrick Fries Primary support relationship to patient:  CHILD, ADULT Degree of support available:   supportive    CURRENT CONCERNS Current Concerns  Post-Acute Placement   Other Concerns:    SOCIAL WORK ASSESSMENT / PLAN CSW met with pt at bedside. Pt alert and oriented and known to CSW. She d/c yesterday from hospital to [facility] for rehab. Pt reports last night she became short of breath and then got anxious. Admitted with CHF and cellulitis. Her daughter was called and met pt at ED. Pt continues to feel overwhelmed with situation due to her personal health issues as well as her husband's who is also hospitalized right now. CSW provided support. Family is very involved. She requests to return to [facility] for rehab when stable. Per Marianna Fuss, okay for return.   Assessment/plan status:  Psychosocial Support/Ongoing Assessment of Needs Other assessment/ plan:   Information/referral to community resources:   [facility]    PATIENT'S/FAMILY'S RESPONSE TO PLAN OF CARE: Pt agreeable to return to [facility] when medically stable. She was at [facility] for <24 hours before returning to hospital. CSW will continue to follow.       Benay Pike, [place]

## 2013-09-28 NOTE — Progress Notes (Signed)
ANTIBIOTIC CONSULT NOTE-Preliminary  Pharmacy Consult for Vancomycin Indication: Cellulitis  Allergies  Allergen Reactions  . Baycol [Cerivastatin Sodium] Other (See Comments)    Rhabdomyolysis   . Iohexol      Desc: PT GOT VERY SOB DURING CARDIAC CATH IN 1998/ STATES THEY HAD TO STOP DOING THE CATH DUE TO THIS/ ALSO PRODUCED SEVERE HEADACHE FOR WEEKS/ PT REFUSING FURTHER CONTRAST/ TSF   . Shrimp [Shellfish Allergy] Other (See Comments)    gout  . Statins   . Toprol Xl [Metoprolol Succinate]     Cough and extreme SOB    Patient Measurements: Weight: 162 lb (73.483 kg)  Vital Signs: Temp: 97.7 F (36.5 C) (03/12 0143) Temp src: Oral (03/12 0143) BP: 112/43 mmHg (03/12 0143) Pulse Rate: 112 (03/12 0143)  Labs:  Recent Labs  09/26/13 0535 10/13/13 2249  WBC 10.2 14.8*  HGB 8.9* 10.0*  PLT 94* 100*  CREATININE 0.80 0.87    The CrCl is unknown because both a height and weight (above a minimum accepted value) are required for this calculation.  No results found for this basename: VANCOTROUGH, Corlis Leak, VANCORANDOM, GENTTROUGH, GENTPEAK, GENTRANDOM, TOBRATROUGH, TOBRAPEAK, TOBRARND, AMIKACINPEAK, AMIKACINTROU, AMIKACIN,  in the last 72 hours   Microbiology: Recent Results (from the past 720 hour(s))  URINE CULTURE     Status: None   Collection Time    09/19/13  1:45 PM      Result Value Ref Range Status   Specimen Description URINE, CLEAN CATCH   Final   Special Requests NONE   Final   Culture  Setup Time     Final   Value: 09/19/2013 15:30     Performed at [place]     Final   Value: 70,000 COLONIES/ML     Performed at [facility]   Culture     Final   Value: ESCHERICHIA COLI     Performed at [facility]   Report Status 09/21/2013 FINAL   Final   Organism ID, Bacteria ESCHERICHIA COLI   Final    Medical History: Past Medical History  Diagnosis Date  . Coronary atherosclerosis of native coronary artery    PTCA LAD 1998; no significant disease in circumflex or RCA 1998  . Chronic atrial fibrillation   . Chronic anticoagulation   . Hyperlipidemia   . Hyperparathyroidism     Parathyroidectomy  . Nephrolithiasis   . Degenerative joint disease     Chronic low back pain; knee pain  . Gout   . Essential hypertension, benign   . COPD (chronic obstructive pulmonary disease)     Medications:   Assessment: 80 yo female admitted for SOB, acute CHF, and with R lower extremity erythema. WBCs elevated. IV Vancomycin to be started for cellulitis.   Goal of Therapy:  Vancomycin troughs 10-15 mcg/ml  Plan:  Preliminary review of pertinent patient information completed.  Protocol will be initiated with a one-time dose of Vancomycin 1250 mg IV.  Forestine Na clinical pharmacist will complete review during morning rounds to assess patient and finalize treatment regimen.  Norberto Sorenson, [facility] 09/28/2013,2:12 AM .

## 2013-09-28 NOTE — Progress Notes (Signed)
NURSING PROGRESS NOTE  [name] [id number] Admitted to 329: 09/28/2013 Attending Provider: Asencion Noble, MD    [name] is a 80 y.o. female patient admitted from ED awake, alert  & orientated  X 4,  DNR, VSS - Blood pressure 112/43, pulse 112, temperature 97.7 F (36.5 C), temperature source Oral, resp. rate 22, height 5' (1.524 m), weight 73.347 kg (161 lb 11.2 oz), SpO2 97.00%., O2    4 L nasal cannular, no c/o shortness of breath, no c/o chest pain, no distress noted. Tele # 12 placed and pt is currently running: A-fib.   IV site WDL:  Right wrist with a transparent dsg that's clean dry and intact.  Allergies:   Allergies  Allergen Reactions  . Baycol [Cerivastatin Sodium] Other (See Comments)    Rhabdomyolysis   . Iohexol      Desc: PT GOT VERY SOB DURING CARDIAC CATH IN 1998/ STATES THEY HAD TO STOP DOING THE CATH DUE TO THIS/ ALSO PRODUCED SEVERE HEADACHE FOR WEEKS/ PT REFUSING FURTHER CONTRAST/ TSF   . Shrimp [Shellfish Allergy] Other (See Comments)    gout  . Statins   . Toprol Xl [Metoprolol Succinate]     Cough and extreme SOB     Past Medical History  Diagnosis Date  . Coronary atherosclerosis of native coronary artery     PTCA LAD 1998; no significant disease in circumflex or RCA 1998  . Chronic atrial fibrillation   . Chronic anticoagulation   . Hyperlipidemia   . Hyperparathyroidism     Parathyroidectomy  . Nephrolithiasis   . Degenerative joint disease     Chronic low back pain; knee pain  . Gout   . Essential hypertension, benign   . COPD (chronic obstructive pulmonary disease)     History:  obtained from Patient and daughter.  Pt orientation to unit, room and routine. Information packet given to patient/family.  Admission INP armband ID verified with patient/family, and in place. SR up x 2, fall risk assessment complete with Patient and family verbalizing understanding of risks associated with falls. Pt verbalizes an understanding of how to  use the call bell and to call for help before getting out of bed.     Will cont to monitor and assist as needed.  Regino Bellow, RN 09/28/2013

## 2013-09-28 NOTE — H&P (Signed)
PCP:   Asencion Noble, MD   Chief Complaint:  Shortness of breath  HPI:  89 female who  has a past medical history of Coronary atherosclerosis of native coronary artery; Chronic atrial fibrillation; Chronic anticoagulation; Hyperlipidemia; Hyperparathyroidism; Nephrolithiasis; Degenerative joint disease; Gout; Essential hypertension, benign; and COPD (chronic obstructive pulmonary disease). Today presented to the ED with chief complaint of worsening shortness of breath. Patient was discharged this morning after she was treated for acute on chronic diastolic heart failure, E. coli UTI, status post EGD and colonoscopy. As per daughter patient developed shortness of breath at the rehabilitation source transferred back to the hospital. Patient also complains of abdominal distention and nausea which she developed after she had colonoscopy, she also admits to having some chest pain. Patient found to have leukocytosis WBC 14,000.  Allergies:   Allergies  Allergen Reactions  . Baycol [Cerivastatin Sodium] Other (See Comments)    Rhabdomyolysis   . Iohexol      Desc: PT GOT VERY SOB DURING CARDIAC CATH IN 1998/ STATES THEY HAD TO STOP DOING THE CATH DUE TO THIS/ ALSO PRODUCED SEVERE HEADACHE FOR WEEKS/ PT REFUSING FURTHER CONTRAST/ TSF   . Shrimp [Shellfish Allergy] Other (See Comments)    gout  . Statins   . Toprol Xl [Metoprolol Succinate]     Cough and extreme SOB      Past Medical History  Diagnosis Date  . Coronary atherosclerosis of native coronary artery     PTCA LAD 1998; no significant disease in circumflex or RCA 1998  . Chronic atrial fibrillation   . Chronic anticoagulation   . Hyperlipidemia   . Hyperparathyroidism     Parathyroidectomy  . Nephrolithiasis   . Degenerative joint disease     Chronic low back pain; knee pain  . Gout   . Essential hypertension, benign   . COPD (chronic obstructive pulmonary disease)     Past Surgical History  Procedure Laterality Date  .  Parathyroidectomy  1970s  . Orif hip fracture  2004    Left hip, following fracture  . Knee arthroscopy      Bilateral  . Kidney stone surgery      x 2  . Total abdominal hysterectomy  Late 1960s  . Colonoscopy  Prior to 2003  . Cardioversion    . Colonoscopy with esophagogastroduodenoscopy (egd) N/A 09/21/2013    Procedure: COLONOSCOPY WITH ESOPHAGOGASTRODUODENOSCOPY (EGD);  Surgeon: Daneil Dolin, MD;  Location: [facility];  Service: Endoscopy;  Laterality: N/A;    Prior to Admission medications   Medication Sig Start Date End Date Taking? Authorizing Provider  albuterol (PROVENTIL) (2.5 MG/3ML) 0.083% nebulizer solution Take 2.5 mg by nebulization every 6 (six) hours as needed for wheezing or shortness of breath.   Yes Historical Provider, MD  furosemide (LASIX) 80 MG tablet Take 80 mg by mouth 2 (two) times daily.   Yes Historical Provider, MD  furosemide (LASIX) 80 MG tablet Take 80 mg by mouth once. Patient had order for an additional dose today only(09/26/2013)   Yes Historical Provider, MD  albuterol (PROVENTIL HFA;VENTOLIN HFA) 108 (90 BASE) MCG/ACT inhaler Inhale 2 puffs into the lungs every 6 (six) hours as needed for wheezing. 01/25/13 01/25/14  Kathee Delton, MD  carboxymethylcellulose (REFRESH PLUS) 0.5 % SOLN Apply 1 drop to eye as needed (for dry eye relief).     Historical Provider, MD  digoxin (LANOXIN) 0.125 MG tablet Take 0.125 mg by mouth every other day.    Historical Provider,  MD  diltiazem (CARDIZEM CD) 240 MG 24 hr capsule Take 1 tablet by mouth every morning.  03/18/11   Historical Provider, MD  furosemide (LASIX) 20 MG tablet Take 4 tablets (80 mg total) by mouth 2 (two) times daily. 09/22/2013   Asencion Noble, MD  mometasone-formoterol [facility]) 100-5 MCG/ACT AERO Inhale 2 puffs into the lungs 2 (two) times daily. 07/11/13   Kathee Delton, MD  pantoprazole (PROTONIX) 40 MG tablet Take 1 tablet (40 mg total) by mouth daily. 10/15/2013   Asencion Noble, MD  potassium chloride SA  (K-DUR,KLOR-CON) 20 MEQ tablet Take 20 mEq by mouth 2 (two) times daily.    Historical Provider, MD  tiotropium (SPIRIVA) 18 MCG inhalation capsule Place 1 capsule (18 mcg total) into inhaler and inhale daily. 07/24/13   Kathee Delton, MD    Social History:  reports that she has never smoked. She does not have any smokeless tobacco history on file. She reports that she does not drink alcohol or use illicit drugs.  Family History  Problem Relation Age of Onset  . Heart attack Mother   . Heart disease Mother   . Hypertension Mother   . Heart attack Father   . Heart disease Father   . Emphysema Father   . Asthma Daughter   . Hypertension Sister   . Cancer Brother   . Asthma Brother   . Emphysema Brother   . Hypertension Sister   . Diabetes Sister   . Kidney disease Sister   . Hypertension Sister   . Cancer Sister   . Hypertension Sister   . Cancer Sister   . Colon cancer Brother     diagnosed in his early 41s     All the positives are listed in BOLD  Review of Systems:  HEENT: Headache, blurred vision, runny nose, sore throat Neck: Hypothyroidism, hyperthyroidism,,lymphadenopathy Chest : Shortness of breath, history of COPD, Asthma Heart : Chest pain, history of coronary arterey disease GI:  Nausea, vomiting, diarrhea, constipation, GERD GU: Dysuria, urgency, frequency of urination, hematuria Neuro: Stroke, seizures, syncope Psych: Depression, anxiety, hallucinations   Physical Exam: Blood pressure 122/44, pulse 91, temperature 98.1 F (36.7 C), temperature source Oral, resp. rate 22, weight 73.483 kg (162 lb), SpO2 97.00%. Constitutional:   Patient is a well-developed and well-nourished *female in no acute distress and cooperative with exam. Head: Normocephalic and atraumatic Mouth: Mucus membranes moist Eyes: PERRL, EOMI, conjunctivae normal Neck: Supple, No Thyromegaly Cardiovascular: RRR, S1 normal, S2 normal Pulmonary/Chest: Bibasilar rales Abdominal: Soft.  Non-tender, non-distended, bowel sounds are normal, no masses, organomegaly, or guarding present.  Neurological: A&O x3, Strenght is normal and symmetric bilaterally, cranial nerve II-XII are grossly intact, no focal motor deficit, sensory intact to light touch bilaterally.  Extremities : Right lower extremity erythematous and warm to touch   Labs on Admission:  Results for orders placed during the hospital encounter of 09/23/2013 (from the past 48 hour(s))  CBC WITH DIFFERENTIAL     Status: Abnormal   Collection Time    09/24/2013 10:49 PM      Result Value Ref Range   WBC 14.8 (*) 4.0 - 10.5 K/uL   RBC 3.95  3.87 - 5.11 MIL/uL   Hemoglobin 10.0 (*) 12.0 - 15.0 g/dL   HCT 33.6 (*) 36.0 - 46.0 %   MCV 85.1  78.0 - 100.0 fL   MCH 25.3 (*) 26.0 - 34.0 pg   MCHC 29.8 (*) 30.0 - 36.0 g/dL   RDW 17.1 (*)  11.5 - 15.5 %   Platelets 100 (*) 150 - 400 K/uL   Comment: RESULT REPEATED AND VERIFIED   Neutrophils Relative % 74  43 - 77 %   Lymphocytes Relative 5 (*) 12 - 46 %   Monocytes Relative 20 (*) 3 - 12 %   Eosinophils Relative 1  0 - 5 %   Basophils Relative 0  0 - 1 %   Neutro Abs 11.0 (*) 1.7 - 7.7 K/uL   Lymphs Abs 0.7  0.7 - 4.0 K/uL   Monocytes Absolute 3.0 (*) 0.1 - 1.0 K/uL   Eosinophils Absolute 0.1  0.0 - 0.7 K/uL   Basophils Absolute 0.0  0.0 - 0.1 K/uL   RBC Morphology BASOPHILIC STIPPLING     WBC Morphology ATYPICAL LYMPHOCYTES     Comment: TOXIC GRANULATION   Smear Review PLATELET COUNT CONFIRMED BY SMEAR    BASIC METABOLIC PANEL     Status: Abnormal   Collection Time    10/12/2013 10:49 PM      Result Value Ref Range   Sodium 137  137 - 147 mEq/L   Potassium 3.9  3.7 - 5.3 mEq/L   Comment: DELTA CHECK NOTED   Chloride 100  96 - 112 mEq/L   CO2 25  19 - 32 mEq/L   Glucose, Bld 134 (*) 70 - 99 mg/dL   BUN 15  6 - 23 mg/dL   Creatinine, Ser 0.87  0.50 - 1.10 mg/dL   Calcium 7.9 (*) 8.4 - 10.5 mg/dL   GFR calc non Af Amer 59 (*) >90 mL/min   GFR calc Af Amer 69 (*) >90  mL/min   Comment: (NOTE)     The eGFR has been calculated using the CKD EPI equation.     This calculation has not been validated in all clinical situations.     eGFR's persistently <90 mL/min signify possible Chronic Kidney     Disease.  DIGOXIN LEVEL     Status: Abnormal   Collection Time    10/10/2013 10:49 PM      Result Value Ref Range   Digoxin Level 0.6 (*) 0.8 - 2.0 ng/mL  PRO B NATRIURETIC PEPTIDE     Status: Abnormal   Collection Time    09/30/2013 10:49 PM      Result Value Ref Range   Pro B Natriuretic peptide (BNP) 2121.0 (*) 0 - 450 pg/mL  TROPONIN I     Status: None   Collection Time    09/20/2013 11:22 PM      Result Value Ref Range   Troponin I <0.30  <0.30 ng/mL   Comment:            Due to the release kinetics of cTnI,     a negative result within the first hours     of the onset of symptoms does not rule out     myocardial infarction with certainty.     If myocardial infarction is still suspected,     repeat the test at appropriate intervals.    Radiological Exams on Admission: Dg Chest Portable 1 View  09/26/2013   CLINICAL DATA Shortness of breath and nausea  EXAM PORTABLE CHEST - 1 VIEW  COMPARISON Prior radiograph from 09/24/2013  FINDINGS Cardiomegaly is stable as compared to prior study.  Lungs are hypoinflated. There is diffuse pulmonary vascular congestion with indistinctness of the interstitial markings, most compatible with pulmonary edema. Small moderate bilateral pleural effusions are present. Associated bibasilar lung  opacities are favored to reflect associated atelectasis and/or edema or effusion. No definite focal infiltrate identified. No pneumothorax.  No acute osseous abnormality.  IMPRESSION 1. Cardiomegaly with diffuse pulmonary vascular congestion and indistinctness of the interstitial markings, most compatible with pulmonary edema. 2. Small to moderate bilateral pleural effusions. Associated bibasilar opacities are favored to reflect atelectasis,  effusion, and/ or edema. No definite focal infiltrate.  SIGNATURE  Electronically Signed   By: Jeannine Boga M.D.   On: 10/05/2013 23:05    Assessment/Plan Active Problems:   Acute CHF   Cellulitis  Cellulitis We'll start the patient on vancomycin per pharmacy, follow CBC the morning  Acute on chronic diastolic heart failure We'll give the patient IV Lasix 20 mg every 12 hours, patient was followed by cardiology in the previous admission. Consider cardio the consultation in a.m.  Atrial fibrillation Heart rate is controlled, patient was not sent on anticoagulation in the setting of anemia. Dig level is 0.6, continued his oxygen and diltiazem.  Anemia Patient had the EGD and colonoscopy which showed gastric erosions and multiple polyps but actively oozing large rectal polyp, anticoagulation has been on hold. Plan was to resume anticoagulation on 3/15 as per GI. Hemoglobin and hematocrit are stable.  Code status: Patient is DO NOT RESUSCITATE  Family discussion: Discussed with patient's daughter at bedside   Time Spent on Admission: 60 min  [facility] Pager: [phone] 09/28/2013, 1:05 AM  If 7PM-7AM, please contact night-coverage  [email or web address]  Password [email or web address]

## 2013-09-28 NOTE — Progress Notes (Signed)
[name], [name]:  [id number]  MEDICAL RECORD NO.:  [id number]  LOCATION:                            FACILITY:  [facility]  PHYSICIAN:  Paula Compton. Willey Blade, MD       DATE OF BIRTH:  Jun 26, 1927  DATE OF PROCEDURE:  09/28/2013 DATE OF DISCHARGE:                                PROGRESS NOTE   [name] was readmitted last night after having shortness of breath.  She appears to be breathing comfortably this morning.  She is also found to have some increased redness in her legs and was started on IV vancomycin for cellulitis.  White count yesterday was 14.8, and it is 9.5 today.  VITAL SIGNS:  Temperature 98.4, pulse 114, respirations 14, blood pressure 139/70, oxygen saturation 97% on supplemental oxygen. LUNGS:  Bibasilar rales. HEART:  Irregularly irregular with a grade 2 systolic murmur. ABDOMEN:  Soft and nontender. EXTREMITIES:  2+ edema with redness and warmth in the lower legs.  IMPRESSION/PLAN: 1. Congestive heart failure with normal ejection fraction.  We will     treat with IV Lasix.  We will reconsult Cardiology. 2. Chronic atrial fibrillation.  Continue diltiazem and digoxin.     Eliquis is being held because of her recent GI bleeding. 3. Anemia.  Hemoglobin is up to 11. 4. Thrombocytopenia, 117,000. 5. Lower extremity cellulitis.  Continue IV vancomycin. 6. Chronic obstructive pulmonary disease.  Continue oxygen and     bronchodilators.     Paula Compton. Willey Blade, MD     ROF/MEDQ  D:  09/28/2013  T:  09/28/2013  Job:  [id number]

## 2013-09-28 NOTE — Progress Notes (Signed)
Utilization Review Complete  

## 2013-09-28 NOTE — Progress Notes (Signed)
Nutrition Brief Note  Patient identified on the Malnutrition Screening Tool (MST) Report  Wt Readings from Last 15 Encounters:  09/28/13 161 lb 11.2 oz (73.347 kg)  10/02/13 162 lb 0.6 oz (73.5 kg)  10/02/13 162 lb 0.6 oz (73.5 kg)  08/10/13 148 lb (67.132 kg)  06/20/13 151 lb 3.2 oz (68.584 kg)  12/19/12 158 lb 12.8 oz (72.031 kg)  08/15/12 160 lb (72.576 kg)  06/20/12 159 lb 12.8 oz (72.485 kg)  03/12/12 162 lb 0.6 oz (73.5 kg)  12/18/11 161 lb 6.4 oz (73.211 kg)  12/16/11 161 lb (73.029 kg)  08/25/11 164 lb (74.39 kg)  08/07/11 167 lb (75.751 kg)  04/22/11 165 lb 6.4 oz (75.025 kg)  04/03/11 160 lb (72.576 kg)   Pt admitted for SOB. Noted that pt was d/[facility] from [facility] on Oct 02, 2013 to [facility] and returned last night for SOB. Appetite was good during previous admission. Noted UBW between 160-165#. Hx of wt stability with noted wt gain trend over past 3 months.   Body mass index is 31.58 kg/(m^2). Patient meets criteria for obesity, class I based on current BMI.   Current diet order is Heart Healthy, patient is consuming approximately 50-100% of meals at this time. Labs and medications reviewed.   No nutrition interventions warranted at this time. If nutrition issues arise, please consult RD.   Kazden Largo A. Jimmye Norman, RD, LDN Pager: [phone]

## 2013-09-29 ENCOUNTER — Inpatient Hospital Stay (HOSPITAL_COMMUNITY): Payer: Medicare Other

## 2013-09-29 LAB — CBC
HCT: 36.3 % (ref 36.0–46.0)
Hemoglobin: 10.6 g/dL — ABNORMAL LOW (ref 12.0–15.0)
MCH: 24.8 pg — AB (ref 26.0–34.0)
MCHC: 29.2 g/dL — ABNORMAL LOW (ref 30.0–36.0)
MCV: 85 fL (ref 78.0–100.0)
Platelets: 112 10*3/uL — ABNORMAL LOW (ref 150–400)
RBC: 4.27 MIL/uL (ref 3.87–5.11)
RDW: 17.4 % — AB (ref 11.5–15.5)
WBC: 19.3 10*3/uL — ABNORMAL HIGH (ref 4.0–10.5)

## 2013-09-29 LAB — BASIC METABOLIC PANEL
BUN: 29 mg/dL — AB (ref 6–23)
CO2: 30 mEq/L (ref 19–32)
CREATININE: 1.97 mg/dL — AB (ref 0.50–1.10)
Calcium: 8.1 mg/dL — ABNORMAL LOW (ref 8.4–10.5)
Chloride: 100 mEq/L (ref 96–112)
GFR, EST AFRICAN AMERICAN: 26 mL/min — AB (ref >90)
GFR, EST NON AFRICAN AMERICAN: 22 mL/min — AB (ref >90)
GLUCOSE: 112 mg/dL — AB (ref 70–99)
Potassium: 5.2 mEq/L (ref 3.7–5.3)
Sodium: 140 mEq/L (ref 137–147)

## 2013-09-29 MED ORDER — ONDANSETRON HCL 4 MG/2ML IJ SOLN
4.0000 mg | Freq: Four times a day (QID) | INTRAMUSCULAR | Status: DC | PRN
  Administered 2013-09-29 (×2): 4 mg via INTRAVENOUS
  Filled 2013-09-29: qty 2

## 2013-09-29 MED ORDER — VANCOMYCIN HCL IN DEXTROSE 750-5 MG/150ML-% IV SOLN
750.0000 mg | INTRAVENOUS | Status: DC
  Administered 2013-09-30: 750 mg via INTRAVENOUS
  Filled 2013-09-29: qty 150

## 2013-09-29 MED ORDER — ONDANSETRON HCL 4 MG/2ML IJ SOLN
4.0000 mg | Freq: Four times a day (QID) | INTRAMUSCULAR | Status: DC
  Filled 2013-09-29: qty 2

## 2013-09-29 MED ORDER — DILTIAZEM HCL ER COATED BEADS 180 MG PO CP24
360.0000 mg | ORAL_CAPSULE | Freq: Every morning | ORAL | Status: DC
  Administered 2013-09-30 – 2013-10-01 (×2): 360 mg via ORAL
  Filled 2013-09-29 (×2): qty 2

## 2013-09-29 MED ORDER — ENOXAPARIN SODIUM 30 MG/0.3ML ~~LOC~~ SOLN
30.0000 mg | SUBCUTANEOUS | Status: DC
  Filled 2013-09-29: qty 0.3

## 2013-09-29 NOTE — Clinical Documentation Improvement (Signed)
Please clarify respiratory status. Thank you  Possible Clinical Conditions?  Acute Respiratory Failure Acute on Chronic Respiratory Failure Chronic Respiratory Failure Other Condition Cannot Clinically Determine   Supporting Information: Risk Factors: History of COPD Diagnosis of Chronic respiratory failure on last admission ED Provider note:Her pulse ox was 81% upon arrival to the ED. Her history is limited due to resp distress. Admitted with Acute on Chroinic diastolic CHF  Signs & Symptoms: Shortness of breath Coughing Per H&P:Pulmonary/Chest: Bibasilar rales  Resp 26   Treatment: Proventil neb q6h prn Symbicort inhaler bid Spiriva inh. Cap qd O2 2-4L/m via [place]  Thank You, Estella Husk ,RN Clinical Documentation Specialist:  [place] Information Management

## 2013-09-29 NOTE — Progress Notes (Signed)
Patient ID: [name], female   DOB: 12-25-1926, 80 y.o.   MRN: [id number]     Subjective:   No complaints this morning   Objective:   Temp:  [97.4 F (36.3 C)-98.5 F (36.9 C)] 97.4 F (36.3 C) (03/13 0602) Pulse Rate:  [90-110] 93 (03/13 0602) Resp:  [16-22] 16 (03/13 0602) BP: (102-129)/(60-67) 129/67 mmHg (03/13 0602) SpO2:  [92 %-97 %] 92 % (03/13 0602) Last BM Date: 10/02/2013  [facility] Weights   09/26/2013 2159 09/28/13 0143  Weight: 162 lb (73.483 kg) 161 lb 11.2 oz (73.347 kg)    Intake/Output Summary (Last 24 hours) at 09/29/13 0805 Last data filed at 09/29/13 0606  Gross per 24 hour  Intake      0 ml  Output    150 ml  Net   -150 ml    Telemetry: afib, rates 120-130s.   Exam:  General:NAD  Resp: CTAB  Cardiac: irreg,, no m/r/g, no JVD  GI: abdomen soft, NT, ND  MSK: 2-3+ LE edema  Neuro: no focal deficits  Lab Results:  Basic Metabolic Panel:  Recent Labs Lab 09/23/13 1052  09/20/2013 2249 09/28/13 0629 09/29/13 0545  NA 137  < > 137 142 140  K 4.0  < > 3.9 4.5 5.2  CL 95*  < > 100 102 100  CO2 32  < > 25 32 30  GLUCOSE 145*  < > 134* 124* 112*  BUN 21  < > 15 17 29*  CREATININE 0.96  < > 0.87 1.10 1.97*  CALCIUM 8.2*  < > 7.9* 7.9* 8.1*  MG 2.0  --   --   --   --   < > = values in this interval not displayed.  Liver Function Tests:  Recent Labs Lab 09/23/13 1052 09/28/13 0629  AST 14 19  ALT 11 8  ALKPHOS 112 106  BILITOT 0.6 0.7  PROT 6.6 5.6*  ALBUMIN 3.0* 2.7*    CBC:  Recent Labs Lab 10/13/2013 2249 09/28/13 0629 09/29/13 0545  WBC 14.8* 19.5* 19.3*  HGB 10.0* 11.0* 10.6*  HCT 33.6* 38.7 36.3  MCV 85.1 85.6 85.0  PLT 100* 117* 112*    Cardiac Enzymes:  Recent Labs Lab 09/22/2013 2322  TROPONINI <0.30    BNP:  Recent Labs  09/18/13 1557 10/15/2013 2249  PROBNP 1451.0* 2121.0*    Coagulation: No results found for this basename: INR,  in the last 168 hours  ECG:   Medications:   Scheduled  Medications: . budesonide-formoterol  2 puff Inhalation BID  . digoxin  0.125 mg Oral QODAY  . diltiazem  300 mg Oral q morning - 10a  . enoxaparin (LOVENOX) injection  40 mg Subcutaneous Q24H  . pantoprazole  40 mg Oral Daily  . sodium chloride  3 mL Intravenous Q12H  . tiotropium  18 mcg Inhalation Daily  . vancomycin  750 mg Intravenous Q24H     Infusions:     PRN Medications:  sodium chloride, acetaminophen, acetaminophen, albuterol, ondansetron (ZOFRAN) IV, sodium chloride     Assessment/Plan    1.Acute on Chronic Diastolic CHF:  - output in chart not complete, unclear how much she diuresed. She was on IV lasix 40mg  bid yesterday, significant increase in Cr, hold diuretics today. Unclear if Cr bump pre-reneal from diuretics or potentially related to vancomycin or alternative etiology. Consider sending FeUrea and urine studies to further evaluate - her lungs are clear, no JVD. She has chronic LE edema that likely  is not truly reflective of her volume status  2. Atrial fibrillation:  - elevated rates yesterday, increaed dilt to 300mg   Qday. Continued on dig every other day dosing. Elevated rates at times, will increase dilt to 360mg  daily (starting with tomorrows dose) - No anticoagulation due to anemia. Hgb is improved on today's labs. Awaiting restart per GI recommendation on Oct 17, 2013.           Carlyle Dolly, M.D., F.A.C.C.

## 2013-09-29 NOTE — Progress Notes (Addendum)
Subjective:  Patient discharged 10/14/2013 and readmitted later that evening. H/O anemia and heme positive stool in setting of Eliquis for afib, EGD showing hh, gastric erosions (h.pylori serologies negative this admission). Colonoscopy showed multiple tubular adenomas. Large rectal polyp was actively oozing and had three hemostasis clips placed. 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 Day of discharge, abdominal exam had improved. Much less distention. She presented back to ER from nursing home 10/17/2013 for worsening SOB. Since readmission her abdominal distention has worsened. She vomited last night. C/o nausea. Small loose nonbloody stool this morning. No abdominal pain. Feels weak and tired.   Objective: Vital signs in last 24 hours: Temp:  [97.4 F (36.3 C)-98.5 F (36.9 C)] 97.4 F (36.3 C) (03/13 0602) Pulse Rate:  [90-110] 93 (03/13 0602) Resp:  [16-22] 16 (03/13 0602) BP: (102-129)/(60-67) 129/67 mmHg (03/13 0602) SpO2:  [92 %-97 %] 92 % (03/13 0602) Last BM Date: 10/14/2013 General:   Alert,  Well-developed, well-nourished, pleasant and cooperative in NAD Head:  Normocephalic and atraumatic. Eyes:  Sclera clear, no icterus.  Chest: diminished breath sounds   Heart:  Regular rate and rhythm; no murmurs, clicks, rubs,  or gallops. Abdomen:  Soft, distended. Hypoactive somewhat tympanic bowel sounds. nontender. No masses, hepatosplenomegaly or hernias noted.  without guarding, and without rebound.   Extremities:  Without clubbing, deformity . 2+ pitting edema LE bilaterally. Neurologic:  Alert and  oriented x4;  grossly normal neurologically. Skin:  Intact without significant lesions or rashes. Psych:  Alert and cooperative. Normal mood and affect.  Intake/Output from previous day: 03/12 0701 - 03/13 0700 In: -  Out: 150 [Urine:150] Intake/Output this shift:    Lab Results: CBC  Recent Labs  09/28/2013 2249 09/28/13 0629  09/29/13 0545  WBC 14.8* 19.5* 19.3*  HGB 10.0* 11.0* 10.6*  HCT 33.6* 38.7 36.3  MCV 85.1 85.6 85.0  PLT 100* 117* 112*   BMET  Recent Labs  09/30/2013 2249 09/28/13 0629 09/29/13 0545  NA 137 142 140  K 3.9 4.5 5.2  CL 100 102 100  CO2 25 32 30  GLUCOSE 134* 124* 112*  BUN 15 17 29*  CREATININE 0.87 1.10 1.97*  CALCIUM 7.9* 7.9* 8.1*   LFTs  Recent Labs  09/28/13 0629  BILITOT 0.7  ALKPHOS 106  AST 19  ALT 8  PROT 5.6*  ALBUMIN 2.7*   No results found for this basename: LIPASE,  in the last 72 hours PT/INR No results found for this basename: LABPROT, INR,  in the last 72 hours     Chest Portable 1 View 10/16/2013 IMPRESSION  1. Cardiomegaly with diffuse pulmonary vascular congestion and  indistinctness of the interstitial markings, most compatible with  pulmonary edema.  2. Small to moderate bilateral pleural effusions. Associated  bibasilar opacities are favored to reflect atelectasis, effusion,  and/ or edema. No definite focal infiltrate.   Assessment:  80 y/o female with readmission for SOB. 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 Abdomen appears more distended today than at time of discharge two days ago. She has increase in WBC but has history of chronically intermittently elevated WBC per records and daughter with known splenomegaly as well. She is afebrile. Denies abdominal pain. Suspect she has progressive ileus.   Plan: 1. Abd film today. May need to back off on diet to clears. Await results.    LOS: 2 days   Neil Crouch  09/29/2013, 8:28 AM  Attending note: Patient seen and examined.  Plain abdominal films not yet done. Patient looks pretty good to me compared to when I saw her here.   Abdomen is distended with increased tympany but soft and nontender. Nursing staff reports loose nonbloody stools. Will send off a C. Difficile sample. Followup plain films as they become available. Would consider assessing thyroid status if not done  recently. Discussed with son.  Plain films performed. Reviewed with Dr. Thornton Papas. Ileus picture to explain abdominal distention. Question etiology of a curi-linear density right upper quadrant under the liver. Doubt free air ; the radiologist recommends noncontrast CT to further evaluate  -  ordered.

## 2013-09-29 NOTE — Progress Notes (Signed)
NAME:  [name],                        ACCOUNT NO.:  MEDICAL RECORD NO.:  LOCATION:                                 FACILITY:  PHYSICIAN:  Paula Compton. Willey Blade, MD            DATE OF BIRTH:  DATE OF PROCEDURE:  09/29/2013 DATE OF DISCHARGE:                                PROGRESS NOTE   SUBJECTIVE:  The patient complains of vomiting overnight.  She has had further abdominal distention.  She has no appetite.  She had a bowel movement yesterday.  Foley catheter has been placed, but she has had only 150 mL of output.  OBJECTIVE:  VITAL SIGNS:  Temperature 97.4, pulse 100, respirations 16, blood pressure 129/67, oxygen saturation 92%. LUNGS:  Bilateral rales. HEART:  Irregular with a grade 2 systolic murmur. ABDOMEN:  Distended and mildly tender. EXTREMITIES:  Edema is mildly improved.  Redness of the lower legs is mildly improved.  IMPRESSION AND PLAN: 1. Congestive heart failure.  Improved BUN and creatinine.  However,     had increased from 17 and 1.1 to 29 and 1.97.  We will hold Lasix     and potassium.  Potassium is up to 5.2. 2. Lower extremity cellulitis.  Continue vancomycin.  White count is     19.3. 3. Thrombocytopenia, stable. 4. Chronic obstructive pulmonary disease.  Continue oxygen and     nebulizers. 5. Pulmonary hypertension. 6. Chronic atrial fibrillation.  Diltiazem has been increased to 300     mg daily. 7. Vomiting/recent ileus.  Re-consult GI.     Paula Compton. Willey Blade, MD     ROF/MEDQ  D:  09/29/2013  T:  09/29/2013  Job:  [id number]

## 2013-09-29 NOTE — Progress Notes (Signed)
ANTIBIOTIC CONSULT NOTE- Follow up  Pharmacy Consult for Vancomycin Indication: Cellulitis  Allergies  Allergen Reactions  . Baycol [Cerivastatin Sodium] Other (See Comments)    Rhabdomyolysis   . Iohexol      Desc: PT GOT VERY SOB DURING CARDIAC CATH IN 1998/ STATES THEY HAD TO STOP DOING THE CATH DUE TO THIS/ ALSO PRODUCED SEVERE HEADACHE FOR WEEKS/ PT REFUSING FURTHER CONTRAST/ TSF   . Shrimp [Shellfish Allergy] Other (See Comments)    gout  . Statins   . Toprol Xl [Metoprolol Succinate]     Cough and extreme SOB   Patient Measurements: Height: 5' (152.4 cm) Weight: 161 lb 11.2 oz (73.347 kg) IBW/kg (Calculated) : 45.5  Vital Signs: Temp: 97.4 F (36.3 C) (03/13 0602) Temp src: Oral (03/13 0602) BP: 129/67 mmHg (03/13 0602) Pulse Rate: 93 (03/13 0602)  Labs:  Recent Labs  Oct 13, 2013 2249 09/28/13 0629 09/29/13 0545  WBC 14.8* 19.5* 19.3*  HGB 10.0* 11.0* 10.6*  PLT 100* 117* 112*  CREATININE 0.87 1.10 1.97*   Estimated Creatinine Clearance: 19 ml/min (by C-G formula based on Cr of 1.97).  No results found for this basename: VANCOTROUGH, Corlis Leak, VANCORANDOM, GENTTROUGH, GENTPEAK, GENTRANDOM, TOBRATROUGH, TOBRAPEAK, TOBRARND, AMIKACINPEAK, AMIKACINTROU, AMIKACIN,  in the last 72 hours   Microbiology: Recent Results (from the past 720 hour(s))  URINE CULTURE     Status: None   Collection Time    09/19/13  1:45 PM      Result Value Ref Range Status   Specimen Description URINE, CLEAN CATCH   Final   Special Requests NONE   Final   Culture  Setup Time     Final   Value: 09/19/2013 15:30     Performed at [facility]     Final   Value: 70,000 COLONIES/ML     Performed at [facility]   Culture     Final   Value: ESCHERICHIA COLI     Performed at [facility]   Report Status 09/21/2013 FINAL   Final   Organism ID, Bacteria ESCHERICHIA COLI   Final   Medical History: Past Medical History  Diagnosis Date  .  Coronary atherosclerosis of native coronary artery     PTCA LAD 1998; no significant disease in circumflex or RCA 1998  . Chronic atrial fibrillation   . Chronic anticoagulation   . Hyperlipidemia   . Hyperparathyroidism     Parathyroidectomy  . Nephrolithiasis   . Degenerative joint disease     Chronic low back pain; knee pain  . Gout   . Essential hypertension, benign   . COPD (chronic obstructive pulmonary disease)    Assessment: 80 yo female admitted for SOB, acute CHF, and with R lower extremity erythema. WBCs elevated. IV Vancomycin to be started for cellulitis.  Estimated Creatinine Clearance: 19 ml/min (by C-G formula based on Cr of 1.97). SCr is starting to rise.    Goal of Therapy:  Vancomycin troughs 10-15 mcg/ml  Plan:   Empirically reduce Vancomycin to 750mg  IV q48hrs  F/U SCr tomorrow  Check trough at steady state  Monitor labs, renal fxn, and cultures  Hart Robinsons A, RPH 09/29/2013,11:33 AM .

## 2013-09-29 NOTE — Progress Notes (Signed)
Reviewed CT with Dr. Thornton Papas. Patient has significantly more ascites now. Cirrhotic appearing liver with enlarged spleen the colon appears Okay. No pneumoperitoneum. Perhaps a mild ileus.  Patient's abdominal distention and increased tympany likely related to air-filled loops of bowel floating up in ascites.  She may well benefit further from limited rectal tube use, however, she adamantly refuses. She is hungry and wants to eat. She hasn't had any nausea or vomiting.  Overall prognosis appears guarded.  Will advance to a 2 g sodium diet. Will discuss further with Dr. Willey Blade

## 2013-09-29 NOTE — Care Management Note (Unsigned)
    Page 1 of 2   09/29/2013     12:01:07 PM   CARE MANAGEMENT NOTE 09/29/2013  Patient:  [name], [name]   Account Number:  [id number]  Date Initiated:  09/29/2013  Documentation initiated by:  Claretha Cooper  Subjective/Objective Assessment:   Pt was at home with her spouse. DC'd from [facility] to [place] but returned to acute care. Per Daughter and Son, will take pt home and provide 24 hr care at home. Would like HH RN and PT and a hospital bed.     Action/Plan:   Anticipated DC Date:  10/02/2013   Anticipated DC Plan:  [place]  CM consult      PAC Choice  [place]   Choice offered to / List presented to:  C-4 Adult Children   DME arranged  HOSPITAL BED      DME agency  [facility] arranged  HH-1 RN  [place].   Status of service:  In process, will continue to follow Medicare Important Message given?   (If response is "NO", the following Medicare IM given date fields will be blank) Date Medicare IM given:   Date Additional Medicare IM given:    Discharge Disposition:    Per UR Regulation:    If discussed at Long Length of Stay Meetings, dates discussed:    Comments:  09/29/13 Claretha Cooper RN BSN CM

## 2013-09-30 DIAGNOSIS — D649 Anemia, unspecified: Secondary | ICD-10-CM

## 2013-09-30 LAB — BASIC METABOLIC PANEL
BUN: 38 mg/dL — ABNORMAL HIGH (ref 6–23)
CALCIUM: 7.7 mg/dL — AB (ref 8.4–10.5)
CO2: 28 meq/L (ref 19–32)
CREATININE: 2.67 mg/dL — AB (ref 0.50–1.10)
Chloride: 99 mEq/L (ref 96–112)
GFR, EST AFRICAN AMERICAN: 18 mL/min — AB (ref >90)
GFR, EST NON AFRICAN AMERICAN: 15 mL/min — AB (ref >90)
Glucose, Bld: 130 mg/dL — ABNORMAL HIGH (ref 70–99)
Potassium: 5.3 mEq/L (ref 3.7–5.3)
SODIUM: 137 meq/L (ref 137–147)

## 2013-09-30 LAB — CBC
HCT: 34.1 % — ABNORMAL LOW (ref 36.0–46.0)
HCT: 33.1 % — ABNORMAL LOW (ref 36.0–46.0)
Hemoglobin: 9.8 g/dL — ABNORMAL LOW (ref 12.0–15.0)
Hemoglobin: 9.5 g/dL — ABNORMAL LOW (ref 12.0–15.0)
MCH: 24.3 pg — ABNORMAL LOW (ref 26.0–34.0)
MCH: 24.1 pg — ABNORMAL LOW (ref 26.0–34.0)
MCHC: 28.7 g/dL — ABNORMAL LOW (ref 30.0–36.0)
MCHC: 28.7 g/dL — ABNORMAL LOW (ref 30.0–36.0)
MCV: 84.4 fL (ref 78.0–100.0)
MCV: 83.8 fL (ref 78.0–100.0)
Platelets: 112 10*3/uL — ABNORMAL LOW (ref 150–400)
Platelets: 110 10*3/uL — ABNORMAL LOW (ref 150–400)
RBC: 4.04 MIL/uL (ref 3.87–5.11)
RBC: 3.95 MIL/uL (ref 3.87–5.11)
RDW: 17.5 % — AB (ref 11.5–15.5)
RDW: 17.8 % — ABNORMAL HIGH (ref 11.5–15.5)
WBC: 22.6 10*3/uL — ABNORMAL HIGH (ref 4.0–10.5)
WBC: 28.5 10*3/uL — ABNORMAL HIGH (ref 4.0–10.5)

## 2013-09-30 LAB — URINALYSIS, ROUTINE W REFLEX MICROSCOPIC
Glucose, UA: NEGATIVE mg/dL
Nitrite: POSITIVE — AB
Protein, ur: 30 mg/dL — AB
Urobilinogen, UA: 0.2 mg/dL (ref 0.0–1.0)
pH: 5 (ref 5.0–8.0)

## 2013-09-30 LAB — URINE MICROSCOPIC-ADD ON

## 2013-09-30 LAB — SODIUM, URINE, RANDOM: Sodium, Ur: <20 mEq/L

## 2013-09-30 LAB — CLOSTRIDIUM DIFFICILE BY PCR: Toxigenic C. Difficile by PCR: NEGATIVE

## 2013-09-30 LAB — CREATININE, URINE, RANDOM: CREATININE, URINE: 129.87 mg/dL

## 2013-09-30 MED ORDER — FUROSEMIDE 10 MG/ML IJ SOLN
20.0000 mg | Freq: Once | INTRAMUSCULAR | Status: AC
  Administered 2013-09-30: 20 mg via INTRAVENOUS
  Filled 2013-09-30: qty 2

## 2013-09-30 MED ORDER — HYDROCODONE-ACETAMINOPHEN 5-325 MG PO TABS
1.0000 | ORAL_TABLET | ORAL | Status: DC | PRN
  Administered 2013-09-30 – 2013-10-01 (×8): 1 via ORAL
  Filled 2013-09-30 (×9): qty 1

## 2013-09-30 MED ORDER — FUROSEMIDE 10 MG/ML IJ SOLN: 60.0000 mg | Freq: Two times a day (BID) | INTRAMUSCULAR | Status: DC

## 2013-09-30 MED ORDER — SODIUM CHLORIDE 0.9 % IV SOLN: 250.0000 mL | INTRAVENOUS | Status: DC | PRN

## 2013-09-30 MED ORDER — FUROSEMIDE 10 MG/ML IJ SOLN
60.0000 mg | Freq: Two times a day (BID) | INTRAMUSCULAR | Status: DC
  Administered 2013-09-30: 60 mg via INTRAVENOUS
  Filled 2013-09-30: qty 6

## 2013-09-30 NOTE — Progress Notes (Addendum)
On first assessment patient has generalized pitting edema, bilateral lower legs are weeping and wrapped in gauze, bilateral arms are weeping.  Is also noted that the patient has scattered red bruise like places to the bilateral arms, legs, back, hands, feet, and even around he eyes and mouth. Patients abdomen is also noted to be edematous.  Will continue to monitor for any changes.

## 2013-09-30 NOTE — Consult Note (Signed)
Reason for Consult: Acute kidney injury Referring Physician: Dr. Hazle [name] is an 80 y.o. female.  HPI: She is a patient who has history of her current disease, antral fibrillation, COPD and liver cirrhosis presently came with complaints of increasing abdominal distention, difficulty in breathing. When she was evaluated her BUN and creatinine presently seems to be increasing. Patient states that she has some swelling of the legs and she has been on diuretics. Patient however denies any previous history of renal failure. Presently her appetite is poor, still she feels short-winded, abdominal distention is worsening. Patient denies any chest pain. Presently she denies also any cough.   Past Medical History  Diagnosis Date  . Coronary atherosclerosis of native coronary artery     PTCA LAD 1998; no significant disease in circumflex or RCA 1998  . Chronic atrial fibrillation   . Chronic anticoagulation   . Hyperlipidemia   . Hyperparathyroidism     Parathyroidectomy  . Nephrolithiasis   . Degenerative joint disease     Chronic low back pain; knee pain  . Gout   . Essential hypertension, benign   . COPD (chronic obstructive pulmonary disease)     Past Surgical History  Procedure Laterality Date  . Parathyroidectomy  1970s  . Orif hip fracture  2004    Left hip, following fracture  . Knee arthroscopy      Bilateral  . Kidney stone surgery      x 2  . Total abdominal hysterectomy  Late 1960s  . Colonoscopy  Prior to 2003  . Cardioversion    . Colonoscopy with esophagogastroduodenoscopy (egd) N/A 09/21/2013    Procedure: COLONOSCOPY WITH ESOPHAGOGASTRODUODENOSCOPY (EGD);  Surgeon: Daneil Dolin, MD;  Location: [facility];  Service: Endoscopy;  Laterality: N/A;    Family History  Problem Relation Age of Onset  . Heart attack Mother   . Heart disease Mother   . Hypertension Mother   . Heart attack Father   . Heart disease Father   . Emphysema Father   . Asthma  Daughter   . Hypertension Sister   . Cancer Brother   . Asthma Brother   . Emphysema Brother   . Hypertension Sister   . Diabetes Sister   . Kidney disease Sister   . Hypertension Sister   . Cancer Sister   . Hypertension Sister   . Cancer Sister   . Colon cancer Brother     diagnosed in his early 88s    Social History:  reports that she has never smoked. She does not have any smokeless tobacco history on file. She reports that she does not drink alcohol or use illicit drugs.  Allergies:  Allergies  Allergen Reactions  . Baycol [Cerivastatin Sodium] Other (See Comments)    Rhabdomyolysis   . Iohexol      Desc: PT GOT VERY SOB DURING CARDIAC CATH IN 1998/ STATES THEY HAD TO STOP DOING THE CATH DUE TO THIS/ ALSO PRODUCED SEVERE HEADACHE FOR WEEKS/ PT REFUSING FURTHER CONTRAST/ TSF   . Shrimp [Shellfish Allergy] Other (See Comments)    gout  . Statins   . Toprol Xl [Metoprolol Succinate]     Cough and extreme SOB    Medications: I have reviewed the patient's current medications.  Results for orders placed during the hospital encounter of 10/02/2013 (from the past 48 hour(s))  BASIC METABOLIC PANEL     Status: Abnormal   Collection Time    09/29/13  5:45 AM  Result Value Ref Range   Sodium 140  137 - 147 mEq/L   Potassium 5.2  3.7 - 5.3 mEq/L   Chloride 100  96 - 112 mEq/L   CO2 30  19 - 32 mEq/L   Glucose, Bld 112 (*) 70 - 99 mg/dL   BUN 29 (*) 6 - 23 mg/dL   Comment: DELTA CHECK NOTED   Creatinine, Ser 1.97 (*) 0.50 - 1.10 mg/dL   Calcium 8.1 (*) 8.4 - 10.5 mg/dL   GFR calc non Af Amer 22 (*) >90 mL/min   GFR calc Af Amer 26 (*) >90 mL/min   Comment: (NOTE)     The eGFR has been calculated using the CKD EPI equation.     This calculation has not been validated in all clinical situations.     eGFR's persistently <90 mL/min signify possible Chronic Kidney     Disease.  CBC     Status: Abnormal   Collection Time    09/29/13  5:45 AM      Result Value Ref Range    WBC 19.3 (*) 4.0 - 10.5 K/uL   RBC 4.27  3.87 - 5.11 MIL/uL   Hemoglobin 10.6 (*) 12.0 - 15.0 g/dL   HCT 36.3  36.0 - 46.0 %   MCV 85.0  78.0 - 100.0 fL   MCH 24.8 (*) 26.0 - 34.0 pg   MCHC 29.2 (*) 30.0 - 36.0 g/dL   RDW 17.4 (*) 11.5 - 15.5 %   Platelets 112 (*) 150 - 400 K/uL   Comment: CONSISTENT WITH PREVIOUS RESULT  CBC     Status: Abnormal   Collection Time    09/30/13  4:40 AM      Result Value Ref Range   WBC 22.6 (*) 4.0 - 10.5 K/uL   RBC 4.04  3.87 - 5.11 MIL/uL   Hemoglobin 9.8 (*) 12.0 - 15.0 g/dL   HCT 34.1 (*) 36.0 - 46.0 %   MCV 84.4  78.0 - 100.0 fL   MCH 24.3 (*) 26.0 - 34.0 pg   MCHC 28.7 (*) 30.0 - 36.0 g/dL   RDW 17.5 (*) 11.5 - 15.5 %   Platelets 112 (*) 150 - 400 K/uL   Comment: SPECIMEN CHECKED FOR CLOTS     LARGE PLATELETS PRESENT     PLATELET COUNT CONFIRMED BY SMEAR  BASIC METABOLIC PANEL     Status: Abnormal   Collection Time    09/30/13  4:40 AM      Result Value Ref Range   Sodium 137  137 - 147 mEq/L   Potassium 5.3  3.7 - 5.3 mEq/L   Chloride 99  96 - 112 mEq/L   CO2 28  19 - 32 mEq/L   Glucose, Bld 130 (*) 70 - 99 mg/dL   BUN 38 (*) 6 - 23 mg/dL   Creatinine, Ser 2.67 (*) 0.50 - 1.10 mg/dL   Calcium 7.7 (*) 8.4 - 10.5 mg/dL   GFR calc non Af Amer 15 (*) >90 mL/min   GFR calc Af Amer 18 (*) >90 mL/min   Comment: (NOTE)     The eGFR has been calculated using the CKD EPI equation.     This calculation has not been validated in all clinical situations.     eGFR's persistently <90 mL/min signify possible Chronic Kidney     Disease.    Ct Abdomen Pelvis Wo Contrast  09/29/2013   CLINICAL DATA:  Abdominal pain.  EXAM: CT ABDOMEN AND PELVIS  WITHOUT CONTRAST  TECHNIQUE: Multidetector CT imaging of the abdomen and pelvis was performed following the standard protocol without intravenous contrast.  COMPARISON:  09/22/2012.  FINDINGS: The lung bases demonstrate persistent moderate sized bilateral pleural effusions with overlying atelectasis.   Stable cirrhotic changes involving the liver but no focal mass. There is associated splenomegaly and moderate abdominal/pelvic ascites increased since the prior examination. No free air.  The pancreas is grossly normal. The adrenal glands and kidneys are unremarkable and stable. Bilateral cysts are noted.  The stomach, duodenum, small bowel and colon are grossly normal. The hepatic flexure now appears normal. No persistent inflammatory changes. No obstructive findings.  No mesenteric or retroperitoneal mass or adenopathy. Stable scattered small lymph nodes. Stable aortic calcifications without focal aneurysm.  Moderate amount of free pelvic fluid is noted. The uterus is surgically absent. The bladder contains a Foley catheter. No pelvic mass or adenopathy. No inguinal mass or hernia.  Diffuse body wall edema noted. A left hip prosthesis is in place. No acute bony findings.  IMPRESSION: Persistent moderate size bilateral pleural effusions with overlying atelectasis. Stable cardiac enlargement.  Significant interval increase in abdominal/pelvic ascites.  Cirrhosis and splenomegaly.  Normal appearance of the splenic flexure.  No free air.   Electronically Signed   By: Kalman Jewels M.D.   On: 09/29/2013 16:19   Dg Abd Portable 2v  09/29/2013   CLINICAL DATA:  Abdominal distention. Elevated white blood cell count.  EXAM: PORTABLE ABDOMEN - 2 VIEW  COMPARISON:  DG ABD ACUTE W/CHEST dated 09/24/2013; CT ABDOMEN W/O CM dated 09/22/2013  FINDINGS: Dense bibasilar atelectasis and/or infiltrates noted with bilateral pleural effusions.  The abdomen is incompletely visualized. However there are distended loops of small bowel. The colon is nondistended. Partial small-bowel obstruction cannot be excluded. Linear density noted in the right upper quadrant could represent visualized falciform ligament. Although no free subdiaphragmatic air is noted,to exclude free air and to further evaluate small bowel distention, CT of the  abdomen and pelvis is suggested. Severe degenerative changes noted of the lumbar spine and both hips. Postsurgical changes left hip. Aortoiliac atherosclerotic vascular disease. Pelvic phleboliths.  IMPRESSION: 1. Dense bibasilar pulmonary infiltrates consistent with pneumonia. Bilateral pleural effusions. 2. Distended loops of small bowel. Although the colonic gas pattern is nonspecific partial small bowel obstruction cannot be excluded. In addition a linear density is noted in the right upper quadrant. This could represent a visualized falciform ligament, and although no free subdiaphragmatic air is noted, to exclude free intraperitoneal air and to further evaluate the small bowel ,CT of the abdomen and pelvis suggested. These results were called by telephone at the time of interpretation on 09/29/2013 at 2:27 PM to Nurse Leroy Kennedy, who verbally acknowledged these results. The patient's nurse was was instructed to immediately notified patient's physician .   Electronically Signed   By: [place]   On: 09/29/2013 14:30    Review of Systems  Respiratory: Positive for shortness of breath.   Cardiovascular: Positive for orthopnea and leg swelling.  Gastrointestinal: Positive for abdominal pain and diarrhea. Negative for vomiting.  Neurological: Positive for weakness.   Blood pressure 109/42, pulse 78, temperature 99.3 F (37.4 C), temperature source Axillary, resp. rate 20, height 5' (1.524 m), weight 76.7 kg (169 lb 1.5 oz), SpO2 92.00%. Physical Exam  Constitutional: She is oriented to person, place, and time. She appears distressed.  Eyes: Left eye exhibits discharge.  Neck: JVD present.  Respiratory: No respiratory distress. She has no  wheezes. She has no rales.  GI: She exhibits distension. There is no tenderness. There is no rebound and no guarding.  Musculoskeletal: She exhibits edema.  Neurological: She is alert and oriented to person, place, and time.  Skin: There is erythema.     Assessment/Plan:  problem #1 acute kidney injury possibly multifactorial including prerenal syndrome/ATN/hepatorenal syndrome/vancomycin-induced acute kidney injury and possibly interstitial nephritis. Presently patient is oliguric. Problem #2 difficulty in breathing: Most likely a combination of ascites/COPD/CHF. Problem #3 abdominal distention: A combination of mild ileus and ascites. Presently patient doesn't have any tenderness during palpation. Problem #4 hypertension: Her systolic blood pressure is low normal. Problem #5 liver cirrhosis Problem #6 chronic arthralgia relation Problem #7 anemia Problem #8 cellulitis of her legs Problem #9 patient is multiple papular erythematous lesions on her hand legs. Plan: We'll start the patient on hydration We'll use Lasix 60 mg IV twice a day We'll check PT and INR We'll check her urine sodium, creatinine. We'll check also her urinalysis.   [name] S 09/30/2013, 10:59 AM

## 2013-09-30 NOTE — Progress Notes (Signed)
Patients legs  kerlix and dressing to right arm changed at this time due to weeping. Dr. Willey Blade present.

## 2013-09-30 NOTE — Progress Notes (Signed)
Patient only put out 125 cc of tea colored urine over night.  On call MD notified and orders received and carried out. Will continue to monitor and past information to the oncoming nurse.

## 2013-09-30 NOTE — Progress Notes (Signed)
Patient bruising is noted to be much worse than this morning. Arms and legs are weeping with fluid. Patient very drowsy but arousable. Very little urine out put. Not enough to get urine sample ordered by nephrologist. Dr. Everette Rank on call for Dr. Willey Blade and notified and Dr. Lowanda Foster notified. WIll start lasix now per Dr. Lowanda Foster.

## 2013-09-30 NOTE — Progress Notes (Signed)
Patient has been up to the chair this morning. Tolerated well. C/o leg pain. Lortab given and patient assisted back to bed. Patienthas new scattered bruising around eyes and on arms and feet. Patient due lovenox at this time. Holding for now. Would recommend stopping per Dr. Lowanda Foster, but he requested medical doctor be asked. Dr. Everette Rank on call and paged at this time.

## 2013-09-30 NOTE — Progress Notes (Addendum)
Decline in responsiveness.  Staff reports no nausea, vomiting or diarrhea  Nephrology consultation today. Renal function worsening with the diminution in urine output   Vital signs in last 24 hours: Temp:  [97.4 F (36.3 C)-99.3 F (37.4 C)] 99.1 F (37.3 C) (03/14 1353) Pulse Rate:  [78-100] 96 (03/14 1353) Resp:  [20] 20 (03/14 1353) BP: (94-116)/(36-69) 116/36 mmHg (03/14 1353) SpO2:  [92 %-97 %] 93 % (03/14 1353) Weight:  [169 lb 1.5 oz (76.7 kg)] 169 lb 1.5 oz (76.7 kg) (03/14 0521) Last BM Date: 09/29/13 General:  Obtunded Abdomen:   Full Moderately distended with some shifting dullness and a fluid wave. Positive bowel sounds. Abdomen is soft, nontender to deep palpation.  Extremities: She does have bruising and some ecchymosis of the extremities particularly the feet  Intake/Output from previous day: 03/13 0701 - 03/14 0700 In: 120 [P.O.:120] Out: 125 [Urine:125] Intake/Output this shift:    Lab Results:  Recent Labs  09/28/13 0629 09/29/13 0545 09/30/13 0440  WBC 19.5* 19.3* 22.6*  HGB 11.0* 10.6* 9.8*  HCT 38.7 36.3 34.1*  PLT 117* 112* 112*   BMET  Recent Labs  09/28/13 0629 09/29/13 0545 09/30/13 0440  NA 142 140 137  K 4.5 5.2 5.3  CL 102 100 99  CO2 32 30 28  GLUCOSE 124* 112* 130*  BUN 17 29* 38*  CREATININE 1.10 1.97* 2.67*  CALCIUM 7.9* 8.1* 7.7*   LFT  Recent Labs  09/28/13 0629  PROT 5.6*  ALBUMIN 2.7*  AST 19  ALT 8  ALKPHOS 106  BILITOT 0.7    Studies/Results: Ct Abdomen Pelvis Wo Contrast  09/29/2013   CLINICAL DATA:  Abdominal pain.  EXAM: CT ABDOMEN AND PELVIS WITHOUT CONTRAST  TECHNIQUE: Multidetector CT imaging of the abdomen and pelvis was performed following the standard protocol without intravenous contrast.  COMPARISON:  09/22/2012.  FINDINGS: The lung bases demonstrate persistent moderate sized bilateral pleural effusions with overlying atelectasis.  Stable cirrhotic changes involving the liver but no focal mass.  There is associated splenomegaly and moderate abdominal/pelvic ascites increased since the prior examination. No free air.  The pancreas is grossly normal. The adrenal glands and kidneys are unremarkable and stable. Bilateral cysts are noted.  The stomach, duodenum, small bowel and colon are grossly normal. The hepatic flexure now appears normal. No persistent inflammatory changes. No obstructive findings.  No mesenteric or retroperitoneal mass or adenopathy. Stable scattered small lymph nodes. Stable aortic calcifications without focal aneurysm.  Moderate amount of free pelvic fluid is noted. The uterus is surgically absent. The bladder contains a Foley catheter. No pelvic mass or adenopathy. No inguinal mass or hernia.  Diffuse body wall edema noted. A left hip prosthesis is in place. No acute bony findings.  IMPRESSION: Persistent moderate size bilateral pleural effusions with overlying atelectasis. Stable cardiac enlargement.  Significant interval increase in abdominal/pelvic ascites.  Cirrhosis and splenomegaly.  Normal appearance of the splenic flexure.  No free air.   Electronically Signed   By: Kalman Jewels M.D.   On: 09/29/2013 16:19   Dg Abd Portable 2v  09/29/2013   CLINICAL DATA:  Abdominal distention. Elevated white blood cell count.  EXAM: PORTABLE ABDOMEN - 2 VIEW  COMPARISON:  DG ABD ACUTE W/CHEST dated 09/24/2013; CT ABDOMEN W/O CM dated 09/22/2013  FINDINGS: Dense bibasilar atelectasis and/or infiltrates noted with bilateral pleural effusions.  The abdomen is incompletely visualized. However there are distended loops of small bowel. The colon is nondistended. Partial small-bowel obstruction cannot  be excluded. Linear density noted in the right upper quadrant could represent visualized falciform ligament. Although no free subdiaphragmatic air is noted,to exclude free air and to further evaluate small bowel distention, CT of the abdomen and pelvis is suggested. Severe degenerative changes noted of  the lumbar spine and both hips. Postsurgical changes left hip. Aortoiliac atherosclerotic vascular disease. Pelvic phleboliths.  IMPRESSION: 1. Dense bibasilar pulmonary infiltrates consistent with pneumonia. Bilateral pleural effusions. 2. Distended loops of small bowel. Although the colonic gas pattern is nonspecific partial small bowel obstruction cannot be excluded. In addition a linear density is noted in the right upper quadrant. This could represent a visualized falciform ligament, and although no free subdiaphragmatic air is noted, to exclude free intraperitoneal air and to further evaluate the small bowel ,CT of the abdomen and pelvis suggested. These results were called by telephone at the time of interpretation on 09/29/2013 at 2:27 PM to Nurse Leroy Kennedy, who verbally acknowledged these results. The patient's nurse was was instructed to immediately notified patient's physician .   Electronically Signed   By: Marcello Moores  Register   On: 09/29/2013 14:30     Impression:  Abdominal discomfort and nausea improved but the patient is less responsive. Abdominal distention  Persists - but more likely now secondary to a predominance of ascitic fluid rather than dilated loops of bowel on my exam this afternoon. Loose stools appear to have improved today. Worsening of renal failure bothersome in the setting of underlying cirrhosis.  Recommendations:  Input from a nephrologist noted and appreciated.  We'll see how she responds to diuretic therapy. Overall prognosis appears to be relatively poor this time.

## 2013-10-01 LAB — CBC
HCT: 34 % — ABNORMAL LOW (ref 36.0–46.0)
Hemoglobin: 9.8 g/dL — ABNORMAL LOW (ref 12.0–15.0)
MCH: 24.6 pg — AB (ref 26.0–34.0)
MCHC: 28.8 g/dL — ABNORMAL LOW (ref 30.0–36.0)
MCV: 85.2 fL (ref 78.0–100.0)
PLATELETS: 116 10*3/uL — AB (ref 150–400)
RBC: 3.99 MIL/uL (ref 3.87–5.11)
RDW: 17.8 % — AB (ref 11.5–15.5)
WBC: 21.8 10*3/uL — ABNORMAL HIGH (ref 4.0–10.5)

## 2013-10-01 LAB — BASIC METABOLIC PANEL
BUN: 46 mg/dL — ABNORMAL HIGH (ref 6–23)
CALCIUM: 7.6 mg/dL — AB (ref 8.4–10.5)
CO2: 28 meq/L (ref 19–32)
Chloride: 98 mEq/L (ref 96–112)
Creatinine, Ser: 3.11 mg/dL — ABNORMAL HIGH (ref 0.50–1.10)
GFR calc Af Amer: 15 mL/min — ABNORMAL LOW (ref >90)
GFR calc non Af Amer: 13 mL/min — ABNORMAL LOW (ref >90)
Glucose, Bld: 114 mg/dL — ABNORMAL HIGH (ref 70–99)
Potassium: 5.3 mEq/L (ref 3.7–5.3)
Sodium: 136 mEq/L — ABNORMAL LOW (ref 137–147)

## 2013-10-01 LAB — PROTIME-INR
INR: 1.27 (ref 0.00–1.49)
Prothrombin Time: 15.6 seconds — ABNORMAL HIGH (ref 11.6–15.2)

## 2013-10-01 MED ORDER — MORPHINE SULFATE 10 MG/ML IJ SOLN
1.0000 mg/h | INTRAVENOUS | Status: DC
  Filled 2013-10-01: qty 10

## 2013-10-01 MED ORDER — DEXTROSE 5 % IV SOLN
1.0000 g | INTRAVENOUS | Status: DC
  Administered 2013-10-01: 1 g via INTRAVENOUS
  Filled 2013-10-01 (×2): qty 10

## 2013-10-01 MED ORDER — FUROSEMIDE 10 MG/ML IJ SOLN
160.0000 mg | Freq: Two times a day (BID) | INTRAVENOUS | Status: DC
  Filled 2013-10-01 (×2): qty 16

## 2013-10-01 MED ORDER — FUROSEMIDE 10 MG/ML IJ SOLN
160.0000 mg | Freq: Once | INTRAMUSCULAR | Status: AC
  Administered 2013-10-01: 160 mg via INTRAVENOUS
  Filled 2013-10-01: qty 16

## 2013-10-01 MED ORDER — LORAZEPAM 0.5 MG PO TABS: 0.5000 mg | ORAL_TABLET | ORAL | Status: DC | PRN

## 2013-10-01 MED ORDER — ALBUMIN HUMAN 25 % IV SOLN
25.0000 g | Freq: Two times a day (BID) | INTRAVENOUS | Status: DC
  Administered 2013-10-01: 25 g via INTRAVENOUS
  Filled 2013-10-01 (×3): qty 100

## 2013-10-02 NOTE — Progress Notes (Signed)
[name], [name]:  [id number]  MEDICAL RECORD NO.:  [id number]  LOCATION:                                 FACILITY:  PHYSICIAN:  Paula Compton. Willey Blade, MD       DATE OF BIRTH:  1927-06-13  DATE OF PROCEDURE:  09/30/2013 DATE OF DISCHARGE:  09/30/2013                                PROGRESS NOTE   [name] complains of having some heaving overnight.  She denies abdominal pain.  She had 2 recorded bowel movements yesterday.  She was evaluated further by Gastroenterology yesterday and was felt to have residual ileus.  A CT scan of the abdomen and pelvis revealed moderate- sized bilateral pleural effusions with atelectasis.  There was a significant increase in ascites.  Splenomegaly and cirrhosis were noted. There was a normal appearance of the splenic flexure, with no free air. No abscess was present.  OBJECTIVE:  VITAL SIGNS:  Temperature 99.3, pulse 78, respirations 20, blood pressure 108/58, oxygen saturation 92% on 4 L. LUNGS:  Basilar rales. HEART:  Irregularly irregular with an improved rate after diltiazem was increased yesterday. ABDOMEN:  Reveals persistent distention.  Bowel sounds are present.  No significant tenderness is noted. EXTREMITIES:  Edema in the leg is improved, redness in the legs is improved.  She is developing some wrinkling of the skin of the lower legs, she now though has developed a splotchy red rash involving her feet, hands, and face.  IMPRESSION AND PLAN: 1. Congestive heart failure with a normal ejection fraction.  Lasix     has been held as it appears she has become likely volume depleted.     BUN and creatinine are up to 38 and 2.67, potassium is 5.3.  She is     having very little urine output and has dark concentrated appearing     urine. 2. Acute renal failure, as above.  We will hydrate intravenously with     normal saline.  We will consult Nephrology. 3. Chronic atrial fibrillation.  Continue holding Eliquis,  heart rate     is improved with higher dose of diltiazem in combination with     digoxin. 4. Lower extremity cellulitis, improved with IV vancomycin.  Her rash     is of concern, however her white count is up to 22.6. 5. Cirrhosis with splenomegaly and chronic thrombocytopenia.  She also     has chronic leukocytosis, but has a higher white count now. 6. Anemia status post recent GI bleeding.  Hemoglobin is at 9.8.  She     is not having signs of bleeding now.  Her polyp pathology revealed     no malignancy. 7. Severe pulmonary hypertension.     Paula Compton. Willey Blade, MD     ROF/MEDQ  D:  09/30/2013  T:  09/30/2013  Job:  [id number]

## 2013-10-03 LAB — URINE CULTURE

## 2013-10-12 NOTE — Discharge Summary (Signed)
NAMEJEANMARIE, MCCOWEN NO.:  [id number]  MEDICAL RECORD NO.:  [id number]  LOCATION:  M226                          FACILITY:  [facility]  PHYSICIAN:  Paula Compton. Willey Blade, MD       DATE OF BIRTH:  1926/11/09  DATE OF ADMISSION:  09/30/2013 DATE OF DISCHARGE:  03-24-2015LH                              DISCHARGE SUMMARY   DEATH SUMMARY  DISCHARGE DIAGNOSES: 1. Acute on chronic diastolic heart failure. 2. Acute renal failure. 3. Pulmonary hypertension. 4. Chronic obstructive pulmonary disease. 5. Cirrhosis. 6. Ascites. 7. Protein-calorie malnutrition. 8. Chronic atrial fibrillation. 9. Ileus. 10.Urinary tract infection. 11.Chronic leukocytosis. 12.Anemia. 13.Thrombocytopenia. 14.Cellulitis.  HOSPITAL COURSE:  This patient was an 80 year old female who was readmitted to the hospital after being discharged the day before.  She developed increased shortness of breath at the [facility] and was sent back to the emergency room where she was admitted again.  She was felt to have CHF.  She had a proBNP of 2121.  Her troponin I was 0.30.  She was treated with IV Lasix.  She had previously responded to IV Lasix during her hospitalization, and her renal function had remained stable during that time.  Her chest x-ray revealed cardiomegaly with diffuse pulmonary vascular congestion.  She had small to moderate bilateral pleural effusions without definite infiltrate.  She has been treated for a urinary tract infection.  Her white count was initially 14.8.  She had mild redness in her lower legs and was treated with vancomycin for cellulitis.  She had a rise in her white count to 28.5.  She subsequently developed acute renal failure with a rise in her BUN and creatinine to 46 and 3.11.  She was seen in Nephrology consultation by Dr. Lowanda Foster.  She was treated with IV fluids and IV Lasix.  The vancomycin was discontinued after cellulitis resolved.  Her urinalysis revealed a  possible recurrent UTI.  Her culture returned following her death revealing yeast.  She had nausea and abdominal distention.  X-rays were consistent with an ileus.  She underwent a CT of the abdomen and pelvis, which revealed cirrhosis and ascites.  She had anemia, thrombocytopenia, and persistent leukocytosis.  She has a history of hematologic abnormalities previously evaluated by Hematology with an unclear underlying diagnosis.  She had progressive weakness and general decline.  A DNR order was in place.  She subsequently expired.  Other studies included a negative C. diff by PCR.     Paula Compton. Willey Blade, MD     ROF/MEDQ  D:  10/12/2013  T:  10/12/2013  Job:  [id number]

## 2013-10-18 NOTE — Progress Notes (Signed)
Subjective: Interval History: has no complaint of nausea or vomiting. Patient with poor appetite. Her difficulty of breathing is a little bit better today..  Objective: Vital signs in last 24 hours: Temp:  [97.9 F (36.6 C)-99.1 F (37.3 C)] 98.3 F (36.8 C) (03/15 0452) Pulse Rate:  [96-110] 108 (03/15 0452) Resp:  [20] 20 (03/15 0452) BP: (95-116)/(36-55) 95/55 mmHg (03/15 0452) SpO2:  [92 %-94 %] 92 % (03/15 0758) Weight change:   Intake/Output from previous day:   Intake/Output this shift:    Generally she looks somnolent but arousable. Chest decreased breath sound bilaterally Heart exam revealed irregular rate and rhythm Abdomen: Distended and slightly tenderness. Dullness to percussion. Extremities she has 1-2+ edema Lab Results:  Recent Labs  09/30/13 1442 09/20/2013 0538  WBC 28.5* 21.8*  HGB 9.5* 9.8*  HCT 33.1* 34.0*  PLT 110* 116*   BMET:  Recent Labs  09/30/13 0440 10/05/2013 0538  NA 137 136*  K 5.3 5.3  CL 99 98  CO2 28 28  GLUCOSE 130* 114*  BUN 38* 46*  CREATININE 2.67* 3.11*  CALCIUM 7.7* 7.6*   No results found for this basename: PTH,  in the last 72 hours Iron Studies: No results found for this basename: IRON, TIBC, TRANSFERRIN, FERRITIN,  in the last 72 hours  Studies/Results: Ct Abdomen Pelvis Wo Contrast  09/29/2013   CLINICAL DATA:  Abdominal pain.  EXAM: CT ABDOMEN AND PELVIS WITHOUT CONTRAST  TECHNIQUE: Multidetector CT imaging of the abdomen and pelvis was performed following the standard protocol without intravenous contrast.  COMPARISON:  09/22/2012.  FINDINGS: The lung bases demonstrate persistent moderate sized bilateral pleural effusions with overlying atelectasis.  Stable cirrhotic changes involving the liver but no focal mass. There is associated splenomegaly and moderate abdominal/pelvic ascites increased since the prior examination. No free air.  The pancreas is grossly normal. The adrenal glands and kidneys are unremarkable and  stable. Bilateral cysts are noted.  The stomach, duodenum, small bowel and colon are grossly normal. The hepatic flexure now appears normal. No persistent inflammatory changes. No obstructive findings.  No mesenteric or retroperitoneal mass or adenopathy. Stable scattered small lymph nodes. Stable aortic calcifications without focal aneurysm.  Moderate amount of free pelvic fluid is noted. The uterus is surgically absent. The bladder contains a Foley catheter. No pelvic mass or adenopathy. No inguinal mass or hernia.  Diffuse body wall edema noted. A left hip prosthesis is in place. No acute bony findings.  IMPRESSION: Persistent moderate size bilateral pleural effusions with overlying atelectasis. Stable cardiac enlargement.  Significant interval increase in abdominal/pelvic ascites.  Cirrhosis and splenomegaly.  Normal appearance of the splenic flexure.  No free air.   Electronically Signed   By: Kalman Jewels M.D.   On: 09/29/2013 16:19   Dg Abd Portable 2v  09/29/2013   CLINICAL DATA:  Abdominal distention. Elevated white blood cell count.  EXAM: PORTABLE ABDOMEN - 2 VIEW  COMPARISON:  DG ABD ACUTE W/CHEST dated 09/24/2013; CT ABDOMEN W/O CM dated 09/22/2013  FINDINGS: Dense bibasilar atelectasis and/or infiltrates noted with bilateral pleural effusions.  The abdomen is incompletely visualized. However there are distended loops of small bowel. The colon is nondistended. Partial small-bowel obstruction cannot be excluded. Linear density noted in the right upper quadrant could represent visualized falciform ligament. Although no free subdiaphragmatic air is noted,to exclude free air and to further evaluate small bowel distention, CT of the abdomen and pelvis is suggested. Severe degenerative changes noted of the lumbar spine and  both hips. Postsurgical changes left hip. Aortoiliac atherosclerotic vascular disease. Pelvic phleboliths.  IMPRESSION: 1. Dense bibasilar pulmonary infiltrates consistent with pneumonia.  Bilateral pleural effusions. 2. Distended loops of small bowel. Although the colonic gas pattern is nonspecific partial small bowel obstruction cannot be excluded. In addition a linear density is noted in the right upper quadrant. This could represent a visualized falciform ligament, and although no free subdiaphragmatic air is noted, to exclude free intraperitoneal air and to further evaluate the small bowel ,CT of the abdomen and pelvis suggested. These results were called by telephone at the time of interpretation on 09/29/2013 at 2:27 PM to Nurse Leroy Kennedy, who verbally acknowledged these results. The patient's nurse was was instructed to immediately notified patient's physician .   Electronically Signed   By: Marcello Moores  Register   On: 09/29/2013 14:30    I have reviewed the patient's current medications.  Assessment/Plan: Problem #1 acute kidney injury: Etiology could be prerenal syndrome versus hepatorenal as patient has very high urine specific gravity and low urine sodium. Presently he came and other etiologies cannot ruled out. Patient remained oliguric. Problem #2 anasarca: No significant improvement Problem #3 anemia: Her hemoglobin and hematocrit is low but stable. Problem #4 hypertension: Her systolic blood pressure is low normal Problem #5 history of COPD Problem #6 liver cirrhosis: Presently being followed by GI. Problem #7 history of cellulitis Problem #8 history of chronic atrial fibrillation: Her heart rate is controlled. Plan: We'll increase her Lasix to 160 mg IV twice a day Will start patient on albumin 25% and 25 mg IV twice a day for 2 days. We'll check compressive metabolic panel in the morning. We'll check her PT and INR.   LOS: 4 days   Anthony Tamburo S 09/22/2013,8:18 AM

## 2013-10-18 NOTE — Progress Notes (Signed)
[name], [name]:  [id number]  MEDICAL RECORD NO.:  [id number]  LOCATION:                                 FACILITY:  PHYSICIAN:  Paula Compton. Willey Blade, MD       DATE OF BIRTH:  11-19-1926  DATE OF PROCEDURE:  10-26-13 DATE OF DISCHARGE:  2013/10/26                                PROGRESS NOTE   SUBJECTIVE:  She has had progressive weakening.  She is having minimal urine output.  She has 125 mL of urine output recorded.  She has had nausea and heaving, but denies abdominal pain.  She has been evaluated and treated by Nephrology.  OBJECTIVE:  VITAL SIGNS:  She is afebrile with temperature 98.3, pulse 108, respirations 20, blood pressure 95/55, oxygen saturation 92% on 4 L by nasal cannula. GENERAL:  Weaker appearing. LUNGS:  Basilar rales. HEART:  Irregularly irregular.  Tachycardic with a grade 2 systolic murmur. ABDOMEN:  Soft, but distended with mild mid abdominal tenderness.  No palpable organomegaly. EXTREMITIES:  Edema has actually decreased. SKIN:  She is exhibiting progressive purpura type changes with bruises increased in the legs.  IMPRESSION/PLAN: 1. Acute renal failure.  BUN and creatinine continued to rise to 46     and 3.11 today.  Potassium is stable at 5.3.  She has been treated     with IV fluids and IV Lasix per Nephrology recommendations.     Discussed further with her son and daughter this morning.  Should     the need for dialysis develop, it does not appear that this would     be desired.  Since her lower extremity cellulitis appears to have     resolved with her improved edema, vancomycin will be discontinued. 2. Urinary tract infection.  Repeat urinalysis yesterday revealed 21-     50 WBCs and too numerous to count red cells with many bacteria.     Urine has been cultured.  She previously had an E. coli UTI during     her last hospitalization, which was treated with ceftriaxone.     Ceftriaxone will be restarted. 3. Cellulitis  resolved.  White count is 21.8. 4. Thrombocytopenia and purpura.  We will ask Hematology to further     assess her. 5. Congestive heart failure, stable. 6. Pulmonary hypertension. 7. Chronic obstructive pulmonary disease. 8. Cirrhosis with ascites. 9. Hypoalbuminemia of 2.7. 10.Chronic atrial fibrillation.  Continue digoxin and diltiazem.     Recheck digoxin level with rising creatinine. 11.Ileus.  Stool for C. diff is negative.  She denies any bowel     movements today.  Overall prognosis appears quite poor at this point.  We will continue Nephrology recommendations.     Paula Compton. Willey Blade, MD     ROF/MEDQ  D:  10/26/13  T:  October 26, 2013  Job:  [id number]

## 2013-10-18 NOTE — Discharge Planning (Signed)
[facility] called and released.  Case # [phone]

## 2013-10-18 NOTE — Progress Notes (Addendum)
Patient remains somnolent; Accompanied by multiple family members. Abdomen remains distended but not tense. Positive bowel sounds. Soft without guarding  Impression:  Multiorgan dysfunction including oligouric renal failure. Some degree of decompensated cirrhosis with ascites. Leukocytosis.  We could consider a paracentesis for diagnostic and therapeutic purpose. She certainly could have SBP on top of everything else. She is already on Rocephin. A tap could take a little pressure off of her diaphragm. Effects would likely be short lived, however. Will discuss with Dr. Willey Blade in am.

## 2013-10-18 NOTE — Progress Notes (Signed)
10/07/2013 1639 Patient's daughter reported patient "moaning, fidgety, uncomfortable" after receiving vicodin 1 tablet as ordered for pain this afternoon. Requested "something else to calm her". Notified Dr. Everette Rank. Order received for ativan 0.5 mg po every 4 hours PRN anxiety/agitation at 1549. Discussed with daughter at bedside. Pt noted to be moaning, labored breathing. O2 sat 76-78% on 4 lpm. O2 increased to 6 lpm, notified RT. Daughter stated "I want her comfortable, don't want her to suffer". Notified Dr. Everette Rank of patient change. No moaning or labored breathing noted earlier this afternoon. Order received at 1615 for morphine drip at 1 mg/hour, titrate as needed for comfort, since patient unable to take po medications at this time. Notified pharmacy to verify morphine drip. Received alert of asytole from central telemetry. Patient noted to have agonal breathing on assessment. No pulse or respirations noted at 1617. Death pronounced by M. Bobette Mo, RN and M. Joya Gaskins, RN. Daughter at bedside. Emotional support provided. Notified Dr. Everette Rank. Donavan Foil, RN

## 2013-10-18 DEATH — deceased

## 2013-10-26 NOTE — Discharge Summary (Signed)
[name], [name]:  [id number]  MEDICAL RECORD NO.:  [id number]  LOCATION:                                 FACILITY:  PHYSICIAN:  Paula Compton. Willey Blade, MD       DATE OF BIRTH:  1927-01-25  DATE OF ADMISSION:  2013/10/27 DATE OF DISCHARGE:  03/15/2015LH                              DISCHARGE SUMMARY   ADDENDUM:  [name] was also treated for possible bacterial pneumonia with Rocephin and vancomycin.     Paula Compton. Willey Blade, MD     ROF/MEDQ  D:  10/25/2013  T:  10/25/2013  Job:  [id number]

## 2013-12-19 ENCOUNTER — Ambulatory Visit: Payer: Medicare Other | Admitting: Pulmonary Disease

## 2014-06-18 IMAGING — CR DG ABDOMEN ACUTE W/ 1V CHEST
3 series · 3 of 3 positions shown · non-contrast
Comparison: DG ABD PORTABLE 2V dated 09/23/2013; CT ABDOMEN W/O CM
dated 09/22/2013

CLINICAL DATA: Abdominal distention.  Recent colonoscopy.

EXAM:
ACUTE ABDOMEN SERIES (ABDOMEN 2 VIEW & CHEST 1 VIEW)

[view not recorded (1 of 3)]
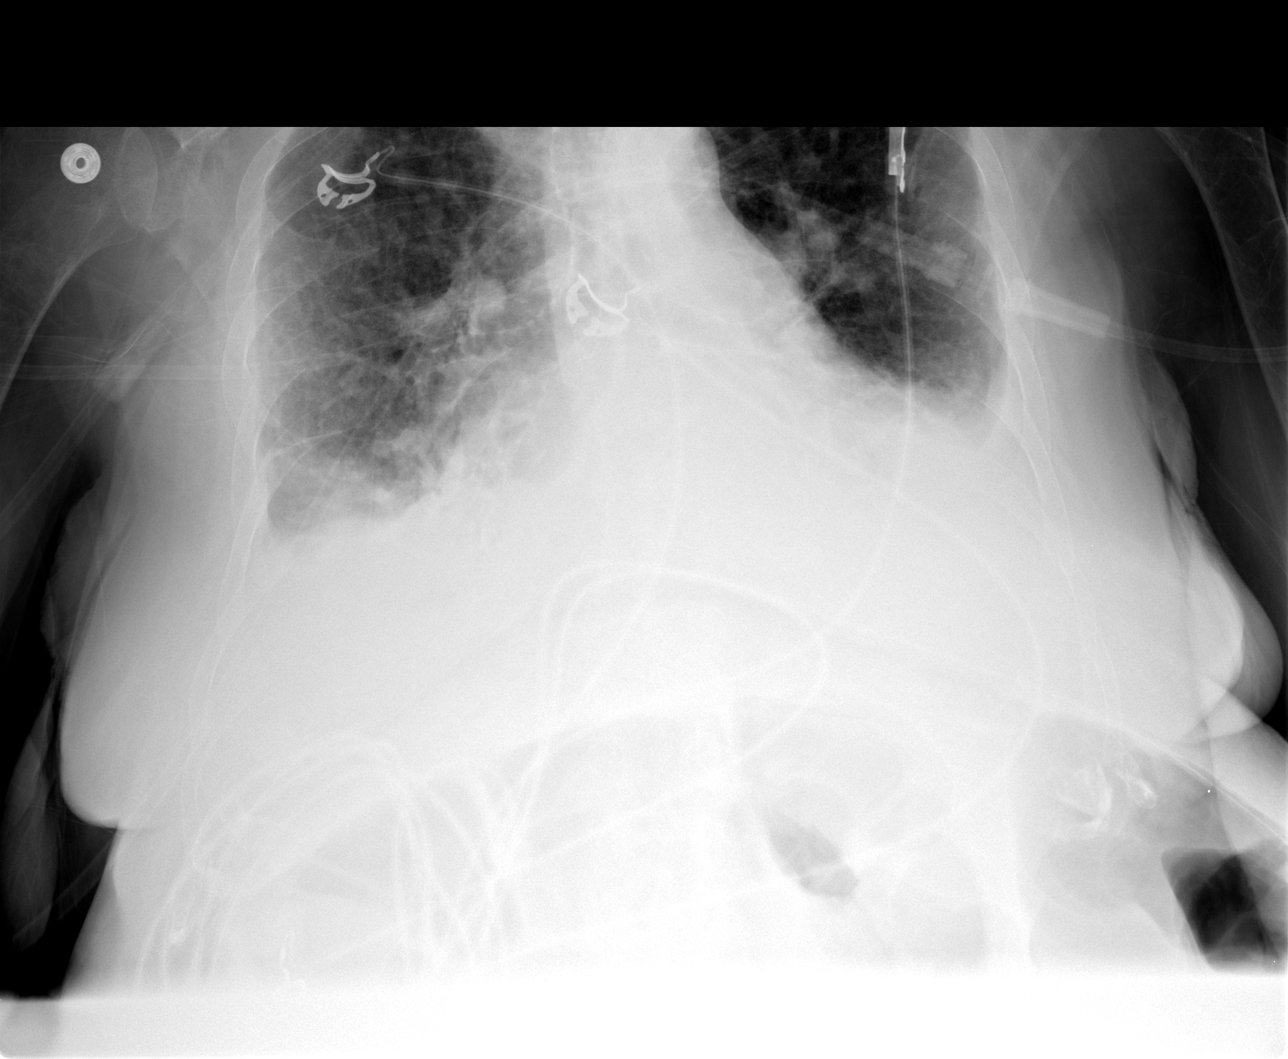

[view not recorded (2 of 3)]
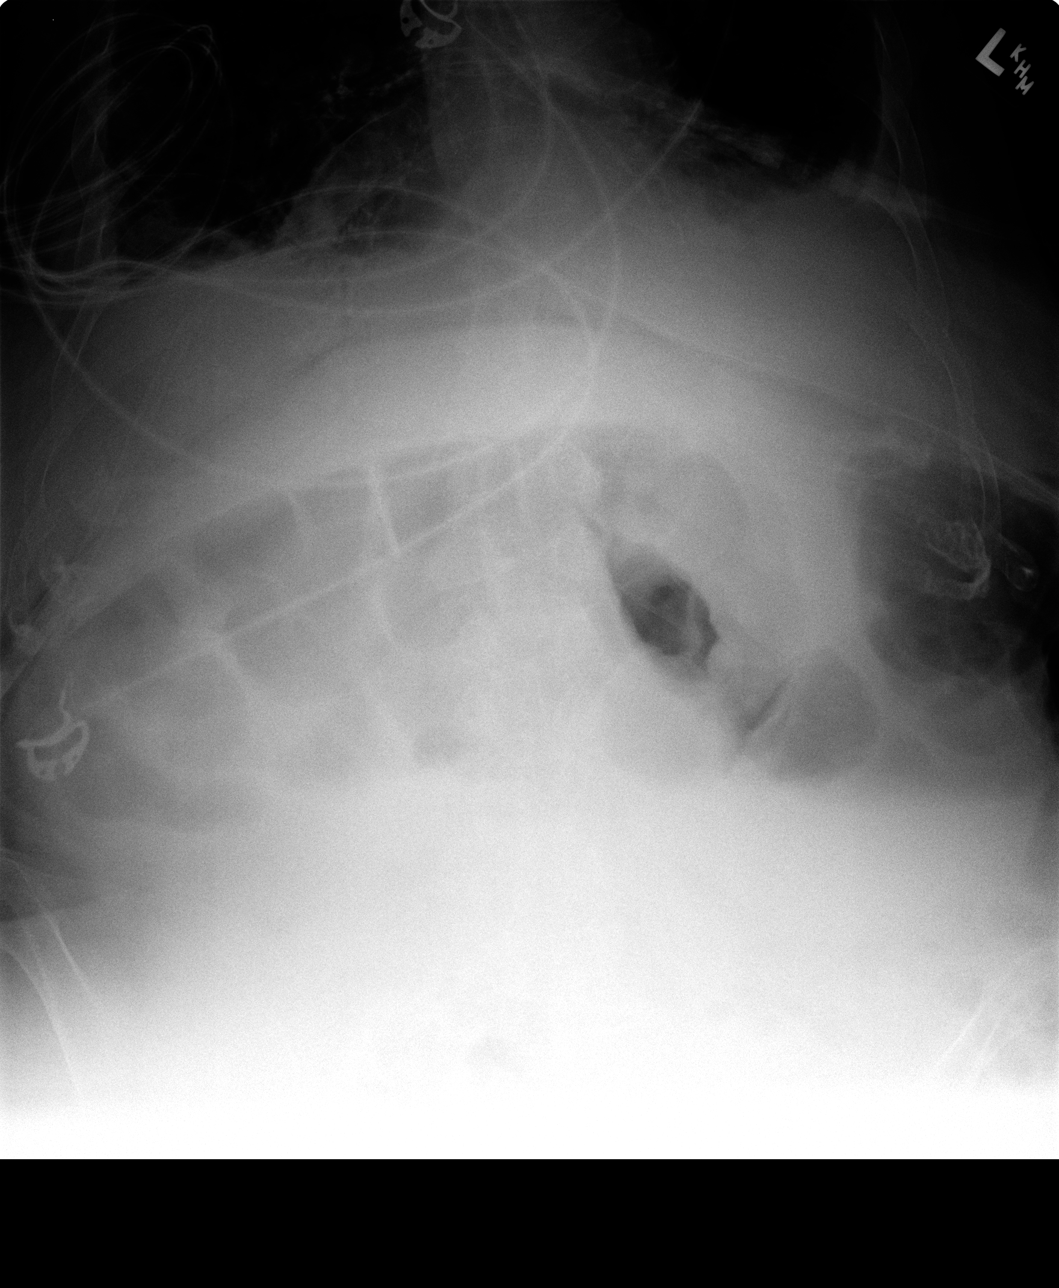

[view not recorded (3 of 3)]
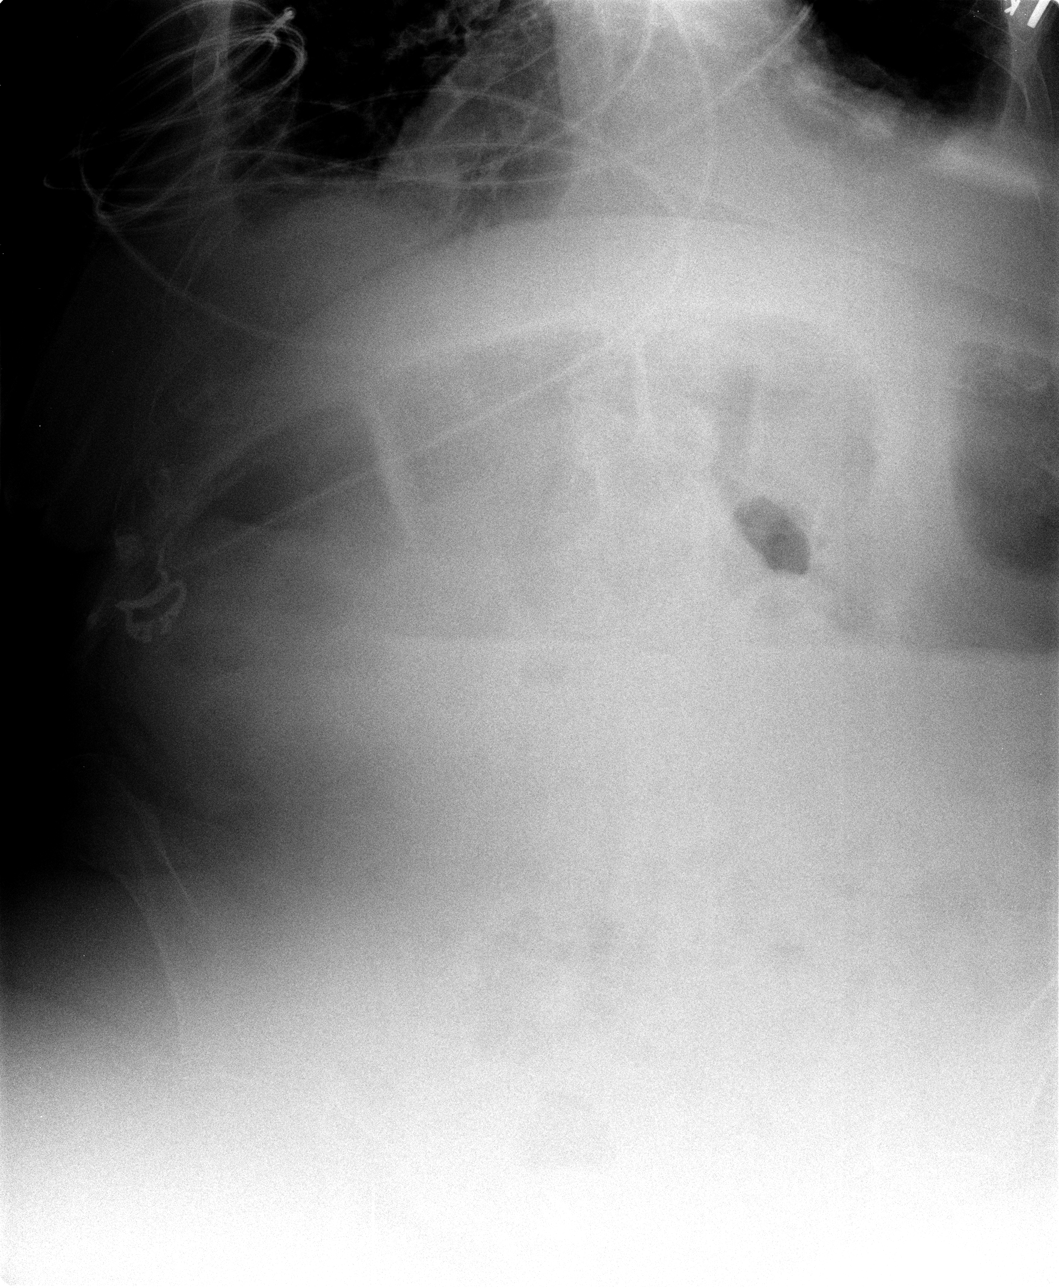

[3 of 3 positions shown; findings below may reference images not displayed]

FINDINGS: Bibasilar chest densities are compatible with pleural fluid and
consolidation. Prominent lung markings may represent mild edema. No
evidence for free air on the upright view. Images are limited due to
body habitus. Again noted is gaseous distention of the transverse
colon. Limited evaluation of the pelvis.
IMPRESSION: Bibasilar chest densities are compatible pleural fluid and
atelectasis. Cannot exclude mild pulmonary edema.

Gaseous distention of the colon.  Limited evaluation of the pelvis.
